# Patient Record
Sex: Male | Born: 1935 | Race: White | Hispanic: No | Smoking: Former smoker
Health system: Southern US, Community
[De-identification: ages and names within clinical notes are randomized; demographics above are authoritative.]

## PROBLEM LIST (undated history)

## (undated) DIAGNOSIS — I219 Acute myocardial infarction, unspecified: Secondary | ICD-10-CM

## (undated) DIAGNOSIS — I739 Peripheral vascular disease, unspecified: Secondary | ICD-10-CM

## (undated) DIAGNOSIS — E119 Type 2 diabetes mellitus without complications: Secondary | ICD-10-CM

## (undated) DIAGNOSIS — N400 Enlarged prostate without lower urinary tract symptoms: Secondary | ICD-10-CM

## (undated) DIAGNOSIS — E7800 Pure hypercholesterolemia, unspecified: Secondary | ICD-10-CM

## (undated) DIAGNOSIS — I10 Essential (primary) hypertension: Secondary | ICD-10-CM

## (undated) DIAGNOSIS — I639 Cerebral infarction, unspecified: Secondary | ICD-10-CM

## (undated) DIAGNOSIS — I6529 Occlusion and stenosis of unspecified carotid artery: Secondary | ICD-10-CM

## (undated) DIAGNOSIS — C801 Malignant (primary) neoplasm, unspecified: Secondary | ICD-10-CM

## (undated) HISTORY — DX: Occlusion and stenosis of unspecified carotid artery: I65.29

## (undated) HISTORY — PX: CARDIAC CATHETERIZATION: SHX172

## (undated) HISTORY — PX: CORONARY ANGIOPLASTY: SHX604

## (undated) HISTORY — DX: Essential (primary) hypertension: I10

## (undated) HISTORY — DX: Benign prostatic hyperplasia without lower urinary tract symptoms: N40.0

## (undated) HISTORY — DX: Type 2 diabetes mellitus without complications: E11.9

---

## 1971-05-26 HISTORY — PX: CHOLECYSTECTOMY: SHX55

## 2002-01-11 ENCOUNTER — Ambulatory Visit (HOSPITAL_COMMUNITY): Admission: RE | Admit: 2002-01-11 | Discharge: 2002-01-11 | Payer: Self-pay | Admitting: Internal Medicine

## 2002-05-10 ENCOUNTER — Ambulatory Visit (HOSPITAL_COMMUNITY): Admission: RE | Admit: 2002-05-10 | Discharge: 2002-05-10 | Payer: Self-pay | Admitting: Internal Medicine

## 2005-02-19 ENCOUNTER — Encounter: Payer: Self-pay | Admitting: Internal Medicine

## 2005-02-19 ENCOUNTER — Ambulatory Visit: Payer: Self-pay | Admitting: Internal Medicine

## 2005-02-19 ENCOUNTER — Ambulatory Visit (HOSPITAL_COMMUNITY): Admission: RE | Admit: 2005-02-19 | Discharge: 2005-02-19 | Payer: Self-pay | Admitting: Internal Medicine

## 2005-05-25 HISTORY — PX: OTHER SURGICAL HISTORY: SHX169

## 2005-06-09 ENCOUNTER — Ambulatory Visit: Payer: Self-pay | Admitting: Internal Medicine

## 2005-06-09 ENCOUNTER — Encounter: Payer: Self-pay | Admitting: Internal Medicine

## 2005-06-09 ENCOUNTER — Ambulatory Visit (HOSPITAL_COMMUNITY): Admission: RE | Admit: 2005-06-09 | Discharge: 2005-06-09 | Payer: Self-pay | Admitting: Internal Medicine

## 2005-06-24 ENCOUNTER — Encounter (INDEPENDENT_AMBULATORY_CARE_PROVIDER_SITE_OTHER): Payer: Self-pay | Admitting: General Surgery

## 2005-06-24 ENCOUNTER — Inpatient Hospital Stay (HOSPITAL_COMMUNITY): Admission: RE | Admit: 2005-06-24 | Discharge: 2005-06-30 | Payer: Self-pay | Admitting: General Surgery

## 2006-07-20 ENCOUNTER — Ambulatory Visit: Payer: Self-pay | Admitting: Internal Medicine

## 2006-07-20 ENCOUNTER — Ambulatory Visit (HOSPITAL_COMMUNITY): Admission: RE | Admit: 2006-07-20 | Discharge: 2006-07-20 | Payer: Self-pay | Admitting: Internal Medicine

## 2006-07-20 HISTORY — PX: COLONOSCOPY: SHX5424

## 2008-02-07 ENCOUNTER — Inpatient Hospital Stay (HOSPITAL_COMMUNITY): Admission: EM | Admit: 2008-02-07 | Discharge: 2008-02-07 | Payer: Self-pay | Admitting: Emergency Medicine

## 2009-05-25 HISTORY — PX: COLONOSCOPY: SHX174

## 2009-09-04 ENCOUNTER — Encounter (INDEPENDENT_AMBULATORY_CARE_PROVIDER_SITE_OTHER): Payer: Self-pay | Admitting: *Deleted

## 2009-10-01 ENCOUNTER — Ambulatory Visit: Payer: Self-pay | Admitting: Internal Medicine

## 2009-10-09 ENCOUNTER — Ambulatory Visit (HOSPITAL_COMMUNITY): Admission: RE | Admit: 2009-10-09 | Discharge: 2009-10-09 | Payer: Self-pay | Admitting: Internal Medicine

## 2009-10-09 ENCOUNTER — Ambulatory Visit: Payer: Self-pay | Admitting: Internal Medicine

## 2009-10-10 DIAGNOSIS — Z86010 Personal history of colonic polyps: Secondary | ICD-10-CM

## 2009-10-14 ENCOUNTER — Encounter: Payer: Self-pay | Admitting: Internal Medicine

## 2010-06-26 NOTE — Letter (Signed)
Summary: Scheduled Appointment  [facility]  [address]   [place], [place] [address]   Phone: [phone]  Fax: [phone]    September 04, 2009   Dear: Jonathan Avery            DOB: 23-Dec-1930    I have been instructed to schedule you an appointment in our office.  Your appointment is as follows:   Date:       Oct 01, 2009   Time:       11 AM     Please be here 15 minutes early.   Provider:    DR [name]    Please contact the office if you need to reschedule this appointment for a more convenient time.   Thank you,    [facility] Ph: [phone]   Fax: [phone]

## 2010-06-26 NOTE — Letter (Signed)
Summary: Patient Notice, Colon Biopsy Results  [facility]  [address]   [place], [place] [address]   Phone: [phone]  Fax: [phone]       Oct 14, 2009   Jonathan Avery [address] [place],   [address] July 15, 1930    Dear Mr. Gade,  I am pleased to inform you that the biopsies taken during your recent colonoscopy did not show any evidence of cancer upon pathologic examination.  Additional information/recommendations:  No further action is needed at this time.  Please follow-up with your primary care physician for your other healthcare needs.  You should have one more colonoscopy examination  in 5 years if your overall health permits.  Please call us if you are having persistent problems or have questions about your condition that have not been fully answered at this time.  Sincerely,    R. Garfield Cornea MD, [facility] Ph: [phone]    Fax: [phone]   Appended Document: Patient Notice, Colon Biopsy Results mailed letter to pt  Appended Document: Patient Notice, Colon Biopsy Results reminder in computer

## 2010-06-26 NOTE — Assessment & Plan Note (Signed)
Summary: consult for tcs,hx of colon neoplasm/ss   Visit Type:  Follow-up Visit Primary Care Provider:  fagan  Chief Complaint:  schedule tcs.  History of Present Illness: 77 year old gentleman returns to set up a followup colonoscopy given his history of a large villous adenoma requiring a right hemicolectomy back in 2007. He had a negative followup colonoscopy in 2008. He has done well from a GI standpoint since that time. Hasn't had a change in bowel habits melena or rectal bleeding. In fact, he denies any significant intercurrent medical problems since being seen lastly here in 2008.  Preventive Screening-Counseling & Management  Alcohol-Tobacco     Smoking Status: never  Current Medications (verified): 1)  Lopressor .Marland Kitchen.. 168m By Mouth Two Times A Day 2)  Norvasc ..Marland Kitchen. 147mBy Mouth Daily 3)  Glucophage ...Marland Kitchen 10006mwo Times A Day 4)  Flomax .... 0.4mg39m Mouth Daily 5)  Lotensin .... Marland Kitchen0mg58mMouth Daily 6)  Vytorin .... One Tablet By Mouth Daily 7)  Avandaryl .... One Tablet By Mouth Daily 8)  Tricor .... 1Marland KitchenMarland KitchenMarland Kitcheng 15mouth Daily 9)  Hctz .... 12Marland Kitchen5mg Da35m 10)  Asa .... 25mMarland Kitchen 1140mish Oil 12)  Hydralazine Hcl 10 Mg Tab (Hydralazine Hcl) .... Take 1 Tablet By Mouth Twice A Day 13)  Cinnamon .... Once Daily 14)  Niacin .... Once Daily 15)  B12 .... Once Daily  Allergies (verified): No Known Drug Allergies  Past History:  Past Medical History: Last updated: 09/30/2009 Coronary Artery Disease Diabetes Hypertension Villous Adenoma, colon Benign prostatic hypertrophy  Past Surgical History: Last updated: 09/30/2009 Cholecystectomy Partial Colectomy   Family History: Last updated: 05/10/20May 30, 2011 deceased Mother: alive- no problem age 47 Sibli61s: 4 brothers No FH of Colon Cancer:  Social History: Last updated: 05/10/202011-05-30 Status: Married Children: 3 Occupation: retired Patient has never smoked.  Alcohol Use - no  Risk Factors: Smoking Status:  never (10/02/2003/30/2011y History: Father: deceased Mother: alive- no problem age 47 Sibli77s: 4 brothers No FH of Colon Cancer:  Social History: Marital Status: Married Children: 3 Occupation: retired Patient has never smoked.  Alcohol Use - no Smoking Status:  never  Vital Signs:  Patient profile:   78 year 79d male Height:      70.5 inches Weight:      242 pounds BMI:     34.36 Temp:     97.6 degrees F oral Pulse rate:   60 / minute BP sitting:   128 / 78  (left arm) Cuff size:   regular  Vitals Entered By: Julie LaBurnadette Petery 10, 2011 11:2876M81:15ysical Exam  General:  veryt pleasant 78 -year21ld gentleman resting comfortably Eyes:  no scleral icteric conjunctiva are pink Lungs:  clear to auscultation Heart:  regular rate and rhythm without murmur gallop or Abdomen:  nondistended positive bowel sounds soft nontender without appreciable mass or organomegaly Rectal:  deferred until time of colonoscopy  Impression & Recommendations: Impression: 78 year 22d gentleman with a history of a large villous adenoma removed via right hemicolectomy back in 2007. He had negative followup colonoscopy in 2008. He presents now for repeat examination. I discussed the risks, benefits, limitations, alternatives and imponderables with this nice gentleman today. His questions have been answered. He is agreeable. We'll make further recommendations once colonoscopy has been carried out.  Appended Document: Orders Update    Clinical Lists Changes  Problems: Added new problem of COLONIC POLYPS, HX OF (ICD-V12.72) Orders: Added new Service order of  Est. Patient Level III (46431) - Signed

## 2010-06-26 NOTE — Letter (Signed)
Summary: TCS ORDER  TCS ORDER   Imported By: Sofie Rower 10/01/2009 11:40:24  _____________________________________________________________________  External Attachment:    Type:   Image     Comment:   External Document

## 2010-10-10 NOTE — Op Note (Signed)
NAME:  Jonathan Avery, Jonathan Avery                         ACCOUNT NO.:  [id number]   MEDICAL RECORD NO.:  [id number]                   PATIENT TYPE:  AMB   LOCATION:  DAY                                  FACILITY:  [facility]   PHYSICIAN:  R. Garfield Cornea, M.D.              DATE OF BIRTH:  1931-01-22   DATE OF PROCEDURE:  05/10/2002  DATE OF DISCHARGE:                                 OPERATIVE REPORT   PROCEDURE:  Colonoscopy with snare polypectomy.   INDICATION FOR PROCEDURE:  The patient is a 77 year old gentleman who  underwent colonoscopy back in August of this year.  He was found to have a  large polyp at the ileocecal valve, which was removed in piecemeal fashion.  I felt that some residual polyp tissue may have been left behind.  He comes  now for colonoscopy for surveillance to make sure that all the polypoid  tissue has been removed.  This approach has been discussed with the patient  previously and again at the bedside.  The potential risks, benefits, and  alternatives have been reviewed, questions answered.  ASA-2.   DESCRIPTION OF PROCEDURE:  O2 saturation, blood pressure, pulse, and  respirations were monitored throughout the entire procedure.   Conscious sedation:  Versed 2 mg IV, Demerol 50 mg IV.   Instrument:  Olympus video chip adult colonoscope.   Findings:  Digital rectal exam revealed no abnormalities.   Endoscopic findings:  Prep was good.   Rectum:  Examination of rectal mucosa, including retroflexed view of the  anal verge, revealed no abnormalities.   Colon:  Colonic mucosa was surveyed from the rectosigmoid junction through  the left, transverse, and right colon to the appendiceal orifice, ileocecal  valve, and cecum.  These structures were well-seen and photographed for the  record.  The patient had scattered small-mouthed diverticula all the way  into the right colon.  At the ileocecal valve there was a nearly 1 cm  sessile polypoid area, most likely  representing the base of the prior larger  polyp, which was removed in piecemeal fashion.  There was also a 5 mm  adjacent polyp on a fold, please see photos.  The polyp was lifted away from  the colonic mucosa with 4 cc of sterile saline injected submucosally.  Subsequently using the snare attached to the ERBE unit, the residual  polypoid tissue was removed in piecemeal fashion, recovered through the  scope.  The smaller adjacent polyp was also removed.  There was some oozing  at the polyp base at the ileocecal valve, for which 4 cc of 1:10,000  epinephrine was injected submucosally and the bleeder was touched up with  the tip of the snare.  It was watched for 10 minutes, and there was no more  bleeding.  From this level the scope was slowly withdrawn.  In the right  colon there were two 5 mm polyps on folds  which were cold biopsied/removed.  All previously-mentioned mucosal surfaces were further inspected, and no  other abnormalities were observed.  The patient tolerated the procedure well  and was reactive in endoscopy.   IMPRESSION:  1. Normal rectum.  2. Pancolonic diverticula.  3. Polyp at the ileocecal valve removed totally today with snare polypectomy     in piecemeal fashion.  A second smaller polyp in the cecum removed in     similar fashion.  Oozing treated with injection of epinephrine and dermal     sealing.  4. Remaining polyps in the right colon cold biopsied/removed.  The remainder     of the colonic mucosa appeared normal.   The ileocecal valve polyp's histology previously demonstrated inflammatory  and adenomatous change.   RECOMMENDATIONS:  1. No aspirin or Advil, etc., for the next 10 days.  2. Follow up on pathology.  3. Further recommendations to follow.                                               Bridgette Habermann, M.D.    RMR/MEDQ  D:  05/10/2002  T:  05/10/2002  Job:  [id number]   cc:   Paula Compton. Willey Blade, M.D.  [address]  [place]  [place]  [address]  Fax: [phone]

## 2010-10-10 NOTE — H&P (Signed)
NAME:  Jonathan Avery, Jonathan Avery NO.:  [id number]   MEDICAL RECORD NO.:  [id number]          PATIENT TYPE:  AMB   LOCATION:                                FACILITY:  [facility]   PHYSICIAN:  Jamesetta So, M.D.  DATE OF BIRTH:  [birth year]   DATE OF ADMISSION:  DATE OF DISCHARGE:  LH                                HISTORY & PHYSICAL   CHIEF COMPLAINT:  Colon mass.   HISTORY OF PRESENT ILLNESS:  The patient is a 77 year old white male who was  referred for evaluation and treatment of a cecal mass.  He has had two  colonoscopies by Dr. [name] who was unable to completely excise the polyp at  the ileocecal valve.  A villous adenoma has been found, and no evidence of  malignancy has been found on both biopsies.  No hematochezia, constipation,  diarrhea, or family history of colon carcinoma is noted.   PAST MEDICAL HISTORY:  1.  Hypertension.  2.  Coronary artery disease.  3.  Non-insulin-dependent diabetes mellitus.   PAST SURGICAL HISTORY:  Open cholecystectomy in 1972.   CURRENT MEDICATIONS:  1.  Lopressor 100 mg p.o. b.i.d.  2.  Norvasc 10 mg p.o. daily.  3.  Glucophage 100 mg p.o. daily.  4.  Flomax 0.4 mg p.o. daily.  5.  Lotensin 40 mg p.o. daily.  6.  Vytorin one tablet p.o. daily.  7.  Avandaryl one tablet p.o. daily.  8.  Tricor 145 mg p.o. daily.  9.  Hydrochlorothiazide 12.5 mg p.o. daily.  10. Baby aspirin which he is holding.   ALLERGIES:  NO KNOWN DRUG ALLERGIES.   REVIEW OF SYSTEMS:  The patient denies drinking or smoking.  He had an MI  approximately 10 years ago.  He denies any recent shortness of breath, chest  pain, leg swelling, CVA, or bleeding disorders.   PHYSICAL EXAMINATION:  GENERAL:  The patient is a well-developed, well-  nourished white male in no acute distress.  HEENT:  No scleral icterus.  LUNGS:  Clear to auscultation with equal breath sounds bilaterally.  HEART:  Regular rate and rhythm without S3, S4, or murmurs.  ABDOMEN:  Soft,  nontender, nondistended.  No hepatosplenomegaly, masses, or  hernias are identified.   IMPRESSION:  Colon neoplasm, unspecified.   PLAN:  The patient is scheduled for a right hemicolectomy on June 24, 2005.  The risks and benefits of the procedure, including bleeding,  infection, cardiopulmonary difficulties, and the possibility of a blood  transfusion were fully explained to the patient who gave informed consent.      Jamesetta So, M.D.  Electronically Signed     MAJ/MEDQ  D:  06/18/2005  T:  06/18/2005  Job:  [id number]   cc:   Paula Compton. Willey Blade, MD  Fax: [phone]   R. Garfield Cornea, M.D.  [address]  [place]  [place] [address]

## 2010-10-10 NOTE — Op Note (Signed)
NAME:  Jonathan Avery, Jonathan Avery                 ACCOUNT NO.:  [id number]   MEDICAL RECORD NO.:  [id number]          PATIENT TYPE:  AMB   LOCATION:  DAY                           FACILITY:  [facility]   PHYSICIAN:  R. Garfield Cornea, M.D. DATE OF BIRTH:  [birth year]   DATE OF PROCEDURE:  07/20/2006  DATE OF DISCHARGE:                               OPERATIVE REPORT   PROCEDURE:  Surveillance colonoscopy.   INDICATIONS FOR PROCEDURE:  The patient is a 77 year old gentleman who  underwent a laparotomy and a right hemicolectomy for a villous adenoma 1  year ago.  He is well.  Has no lower GI tract symptoms.  He is here for  a 1-year surveillance.  This approach has discussed with the patient at  length.  Potential risks and benefits and been reviewed.  Please see the  documentation in Dr. the medical record.   PROCEDURE NOTE:  O2 saturation, blood pressure, pulse and respirations  were monitored throughout the entire procedure.  Conscious sedation:  Demerol 75 mg IV and Versed 4 mg IV in divided doses.  Instrument:  Pentax video chip system.   FINDINGS:  Digital rectal exam revealed no abnormalities.  Endoscopic  findings:  The prep was excellent.  Examination of the colonic mucosa  was undertaken from the rectosigmoid junction.  The scope was advanced  through the left and transverse segments to the area of anastomosis with  small bowel.  This anastomosis was well-seen.  The anastomosis was  patent and the distal small bowel appeared normal.  From this level the  scope was slowly withdrawn and all previously-mentioned mucosal surfaces  were again seen.  The scope was pulled down in the rectum,where a  thorough examination of the rectal mucosa and a retroflexed view of the  anal verge was undertaken.  The residual colon and the rectal mucosa  appeared normal.  The patient tolerated the procedure well and was  reacted in endoscopy.   IMPRESSION:  Status post right hemicolectomy.  Residual colonic mucosa  appeared normal, normal rectum.   RECOMMENDATIONS:  Repeat colonoscopy 3 years.      Bridgette Habermann, M.D.  Electronically Signed     RMR/MEDQ  D:  07/20/2006  T:  07/20/2006  Job:  [id number]   cc:   Paula Compton. Willey Blade, MD  Fax: [phone]

## 2010-10-10 NOTE — Op Note (Signed)
NAME:  Jonathan Avery, Jonathan Avery NO.:  [id number]   MEDICAL RECORD NO.:  [id number]          PATIENT TYPE:  INP   LOCATION:  A304                          FACILITY:  [facility]   PHYSICIAN:  Jamesetta So, M.D.  DATE OF BIRTH:  1930/06/18   DATE OF PROCEDURE:  06/24/2005  DATE OF DISCHARGE:                                 OPERATIVE REPORT   PREOPERATIVE DIAGNOSIS:  Colon neoplasm.   POSTOPERATIVE DIAGNOSIS:  Colon neoplasm.   PROCEDURE:  Right hemicolectomy (partial colectomy with resection of  terminal ileum).   SURGEON:  Dr. Aviva Signs.   ASSISTANT:  Dr. Newt Minion.   ANESTHESIA:  General endotracheal.   INDICATIONS:  The patient is a 77 year old white male who was found on  colonoscopy by Dr. [name] to have a mass in the cecum at the ileocecal valve  that could not be fully resected. A villous adenoma was found. The patient  now comes to the operating for a right hemicolectomy. Risks and benefits of  the procedure including bleeding, infection, cardiopulmonary difficulties  and the possibility of a blood transfusion were fully explained to the  patient, who gave informed consent.   PROCEDURE NOTE:  The patient was placed in supine position. After induction  of general endotracheal anesthesia, the abdomen was prepped and draped using  the usual sterile technique with Betadine. Surgical site confirmation was  performed.   A midline incision was made from above the umbilicus to below the umbilicus.  The peritoneal cavity was entered into without difficulty. There was some  adhesions from the previous open cholecystectomy in the right upper  quadrant, and these were freed away sharply and using the harmonic scalpel  without difficulty. The liver was palpated and noted to be within normal  limits. The right colon was mobilized along the peritoneal reflection. A GIA  stapler was placed across the terminal ileum and fired. This was proximal  transverse colon. The  mesentery of the right colon was then divided using  the harmonic scalpel silk ties. The specimen was opened in the operating  room once it was removed from the operative field. The colonic neoplasm  within the specimen was removed. This was then sent to pathology for further  examination. A side-to-side ileocolic anastomosis was then performed using a  GIA 70 stapler. The enterotomy was closed using a TA-60 stapler. The staple  line was bolstered using 3-0 silk Lambert sutures. The mesenteric defect was  closed using a 2-0 chromic gut running suture and normal saline. The bowel  was returned into the abdominal cavity an in orderly fashion. All surgical  personnel then changed their gloves.   The fascia was reapproximated using a looped 0 Novofil running suture.  Subcutaneous layer was irrigated with normal saline, and the skin was closed  using staples. Betadine ointment and dry sterile dressings were applied.   All tape and needle counts were correct at the end of the procedure. The  patient was extubated in the operating room and went back to recovery room  awake in stable condition.   COMPLICATIONS:  None.   SPECIMEN:  Right colon.   BLOOD LOSS:  Minimal.      Jamesetta So, M.D.  Electronically Signed     MAJ/MEDQ  D:  06/24/2005  T:  06/24/2005  Job:  [id number]

## 2010-10-10 NOTE — Discharge Summary (Signed)
NAME:  Jonathan Avery, Jonathan Avery NO.:  [id number]   MEDICAL RECORD NO.:  [id number]          PATIENT TYPE:  INP   LOCATION:  A304                          FACILITY:  [facility]   PHYSICIAN:  Jamesetta So, M.D.  DATE OF BIRTH:  1930/08/12   DATE OF ADMISSION:  06/24/2005  DATE OF DISCHARGE:  02/06/2007LH                                 [place] COURSE SUMMARY:  the patient is a 77 year old white male who was  found on multiple endoscopies to have a villous adenoma at the ileocecal  valve. This could not be removed endoscopically, and the patient was  referred for a right hemicolectomy. This was performed on June 24, 2005.  The patient tolerated procedure well. His postoperative course was  remarkable only for a mild ileus which quickly resolved. His diet was then  advanced without difficulty. Final pathology revealed residual villous  adenoma without evidence of carcinoma. The patient is being discharged home  on June 30, 2005 in good and improving condition.   DISCHARGE INSTRUCTIONS:  The patient is to follow up with Dr. Aviva Signs  on July 02, 2005. Discharge medications include Darvocet-N 100 one to two  tablets p.o. q.4h. p.r.n. pain. He is to resume all his other medications as  previously prescribed.   PRINCIPAL DIAGNOSES:  1.  Villous adenoma, colon.  2.  Hypertension.  3.  Coronary artery disease.  4.  Non-insulin-dependent diabetes mellitus.  5.  Benign prostatic hypertrophy.   PRINCIPAL PROCEDURE:  Partial colectomy with resection of terminal ileum on  June 24, 2005.      Jamesetta So, M.D.  Electronically Signed     MAJ/MEDQ  D:  06/30/2005  T:  06/30/2005  Job:  [id number]   cc:   Paula Compton. Willey Blade, MD  Fax: [phone]   R. Garfield Cornea, M.D.  [address]    [place] [address]

## 2010-10-10 NOTE — Op Note (Signed)
NAME:  Jonathan Avery, Jonathan Avery                         ACCOUNT NO.:  [id number]   MEDICAL RECORD NO.:  [id number]                   PATIENT TYPE:  AMB   LOCATION:  DAY                                  FACILITY:  [facility]   PHYSICIAN:  Cristopher Estimable. [name], M.D.               DATE OF BIRTH:  1930-07-16   DATE OF PROCEDURE:  01/11/2002  DATE OF DISCHARGE:                                 OPERATIVE REPORT   PROCEDURE:  Colonoscopy with snare polypectomy.   INDICATIONS:  The patient is a 77 year old gentleman referred for colorectal  cancer screening.  The patient is devoid of any lower GI tract symptoms, no  family history of colorectal neoplasia, and a sigmoidoscopy to 50 cm in 1997  did reveal some diverticular changes but no other abnormalities were seen  (Dr. Willey Blade).  He is here for full colonoscopy for screening purposes.  The  approach has been discussed with the patient extensively, and the potential  risks, benefits, and alternatives have been reviewed, questions have been  answered, and he is agreeable.  He is at low risk for conscious sedation.  Please see my handwritten H&P for more information.   DESCRIPTION OF PROCEDURE:  O2 saturation, blood pressure, pulse, and  respiration were monitored throughout the entire procedure.   Conscious sedation:  Versed 6 mg IV, Demerol 75 mg IV in divided doses.   Instrument:  Olympus video chip adult colonoscope.   Findings:  Digital rectal exam revealed no abnormalities.  Endoscopic  findings:  The prep was adequate.   Rectum:  Examination of the rectal mucosa including the retroflex view of  the anal verge revealed no abnormalities.   Colon:  The colonic mucosa was surveyed from the rectosigmoid junction  through the left, transverse, right colon, to the area of the appendiceal  orifice, ileocecal valve.  The ileocecal valve was abnormal.  There was a  pedunculated polypoid mass growing out of the end of the ileocecal valve.  It was growing  across both the distal and proximal sides of the ileocecal  valve.  Please see photos.  The remainder of the colonic mucosa appeared  normal.  This lesion was engaged with the snare.  Most of it was removed  with snare polypectomy in piecemeal fashion.  It was very difficult to get  behind the upstream side of the ileocecal valve.  Certainly a majority of  this lesion was removed.  It took quite awhile to get a good approach and to  get the pieces suctioned through the scope.  Again the vast majority of the  lesion was removed.  I felt it not to be very safe to attempt further  excavation and removal of this lesion today.  Please see before and after  photos.  From this level the scope was slowly withdrawn.  All previously-  viewed mucosal surfaces were again seen.  No other abnormalities were  observed.  The patient tolerated the rather lengthy procedure very well and  was reactivated in endoscopy.   IMPRESSION:  1. Normal rectum.  2. Polypoid lesion growing out of the ileocecal valve, removed in piecemeal     fashion.  A small amount of this lesion may remain behind the ileocecal     valve as described above.    RECOMMENDATIONS:  1. No aspirin or arthritis medications for 10 days.  2. Follow up on pathology.   This lesion appears to be at least a precancerous adenomatous polyp.  It may  well contain Isaah carcinoma.  If this were the case, I told the patient he  may need a resection.  If it does not contain Duilio carcinoma, he will need  a re-look at this area for removal of any residual tissue in the next one to  two months.  Further recommendations to follow.                                                Cristopher Estimable. [name], M.D.    RMR/MEDQ  D:  01/11/2002  T:  01/12/2002  Job:  [id number]   cc:   Paula Compton. Willey Blade, M.D.

## 2010-10-10 NOTE — Op Note (Signed)
NAME:  Jonathan Avery, Jonathan Avery NO.:  [id number]   MEDICAL RECORD NO.:  [id number]          PATIENT TYPE:  AMB   LOCATION:  DAY                           FACILITY:  [facility]   PHYSICIAN:  R. Garfield Cornea, M.D. DATE OF BIRTH:  01-Dec-1930   DATE OF PROCEDURE:  02/19/2005  DATE OF DISCHARGE:                                 OPERATIVE REPORT   PROCEDURE:  Colonoscopy with polypectomy/saline-assisted polypectomy.   INDICATIONS FOR PROCEDURE:  The patient is a 77 year old gentleman with a  history of colonic adenomatous polyps. He had an ileocecal valve polyp back  in 2003. He is devoid of any lower GI tract symptoms. He is here for  surveillance. This approach has been discussed with the patient at length.  Potential risks, benefits, and alternatives have been reviewed and questions  answered. He is agreeable. Please see documentation in the medical record.   PROCEDURE NOTE:  O2 saturation, blood pressure, pulse, and respirations were  monitored throughout the entire procedure. Conscious sedation with Versed 5  mg IV and Demerol 50 mg IV in divided doses.   INSTRUMENT:  Olympus video chip system.   FINDINGS:  Digital rectal exam revealed no abnormalities.   ENDOSCOPIC FINDINGS:  Prep was good.   Rectum:  Examination of the rectal mucosa including retroflexed view of the  anal verge revealed no abnormalities.   Colon:  Colonic mucosa was surveyed from the rectosigmoid junction through  the left, transverse, and right colon to the area of the appendiceal  orifice, ileocecal valve, and cecum. There was a cauliflower sessile polyp  approximately 1.5 to 2 cm growing out the ileocecal valve, going behind the  ileocecal valve, and there were three 5-mm adjacent pedunculated polyps.  Please see photos. From this level, the scope was slowly withdrawn, and all  previously mentioned mucosal surfaces were again seen. Aside from having  pancolonic diverticula, there were no other  abnormalities. Attention was  turned to the polyps at the end and at the ileocecal valve. The two smaller  polyps were removed with hot snare technique. The polypoid lesion at  ileocecal valve was shaved off in a piecemeal fashion with snare cautery.  Multiple fragments were recovered through the scope. It was difficult to get  on the back side of the polyp (toward the base of the cecum). I was able to  get somewhat behind it with the needle and inject 2 cc of normal saline.  This did lift out the polyp which gave me a better access. Was able to shave  the rest of it down fairly well. Multiple fragments again submitted to the  pathologist. There was good hemostasis. The patient tolerated the procedure  well and was reactive to endoscopy.   IMPRESSION:  1.  Normal rectum and pancolonic diverticula.  2.  Multiple polyps in and around the ileocecal valve status post saline-      assisted snare polypectomy. Multiple polypectomy performed.   RECOMMENDATIONS:  1.  No aspirin or arthritis medications for the next 10 days.  2.  Follow up on pathology.  3.  Further recommendations  to follow.      Bridgette Habermann, M.D.  Electronically Signed     RMR/MEDQ  D:  02/19/2005  T:  02/19/2005  Job:  [id number]   cc:   Paula Compton. Willey Blade, MD  Fax: [phone]

## 2010-10-10 NOTE — Op Note (Signed)
NAME:  Jonathan Avery, Jonathan Avery                 ACCOUNT NO.:  [id number]   MEDICAL RECORD NO.:  [id number]          PATIENT TYPE:  AMB   LOCATION:  DAY                           FACILITY:  [facility]   PHYSICIAN:  R. Garfield Cornea, M.D. DATE OF BIRTH:  04/18/1931   DATE OF PROCEDURE:  06/09/2005  DATE OF DISCHARGE:                                 OPERATIVE REPORT   PROCEDURE:  Colonoscopy with snare polypectomy and biopsy.   INDICATIONS FOR PROCEDURE:  The patient is a 77 year old gentleman with a  history of villous adenoma, removed a polyp from ileocecal valve back in  September. It was sprawling and went behind the ileocecal valve and down  towards the base of the cecum. There was concern that it may not have all  been removed. He is here for followup exam. This approach has been discussed  with the patient previously and again at the bedside. He is not having any  GI symptoms. Potential risks, benefits, and alternatives have been reviewed  and questions answered. He is agreeable. Please see documentation in the  medical record.   PROCEDURE NOTE:  O2 saturation, blood pressure, pulse, and respirations were  monitored throughout the entire procedure. Conscious sedation with Versed 4  mg IV and Demerol 100 mg IV in divided doses.   INSTRUMENT:  Olympus video chip system.   FINDINGS:  Digital rectal exam revealed no abnormalities.   ENDOSCOPIC FINDINGS:  Prep was unfortunately not very good, particularly on  the right side where we have special interest.   Rectum:  Examination of the rectal mucosa including retroflexed view of the  anal verge revealed no abnormalities.   Colon:  Colonic mucosa was surveyed from the rectosigmoid junction through  the left, transverse, and right colon to the area of the appendiceal  orifice, ileocecal valve, and cecum. The landmarks were difficult to see  because there was a quite a bit of formed and semi-formed stool. (The  patient states he took all of his prep  as recommended). There did appear to  be persistence of polypoid tissue at the ileocecal valve going over the  distal side up to the proximal side down into the cecum, the total extent of  which could not be seen. It was felt the colon had been ___________ because  the patient did take his prep. Utilizing the snare cautery unit, the polyp  was shaved additionally on the distal side of the ileocecal valve. There was  some persisting polypoid tissue protruding from the proximal side of the  valve. I was unable to get at it in part because of the poor prep and  location. However, cold biopsy forceps were utilized to push the ileocecal  valve out of the way and biopsy some of this polypoid material on the  proximal side of the valve. From this level, the scope was slowly withdrawn,  and all previously mentioned mucosal surfaces were again seen. The patient  had scattered pan colonic diverticula. There was no other persisting  polypoid lesions or mass seen. However, the prep was poor which definitely  compromised  the exam today. The patient tolerated the procedure well and was  reactive to endoscopy.   IMPRESSION:  Normal colon and pan colonic diverticula. Marginal to poor prep  made the exam more difficult. Persistence of polypoid lesion ileocecal valve  (as previously noted to be a villous adenoma) was shaved additionally with  snare cautery, and biopsy of the proximal aspect was taken, but I do not  feel all of this lesion was removed.   RECOMMENDATIONS:  1.  Will follow up on pathology.  2.  Further recommendations to follow.  3.  No aspirin or arthritis medications for the next 10 days.      Bridgette Habermann, M.D.  Electronically Signed     RMR/MEDQ  D:  06/09/2005  T:  06/09/2005  Job:  [id number]   cc:   Paula Compton. Willey Blade, MD  Fax: [phone]

## 2010-10-10 NOTE — H&P (Signed)
NAME:  Jonathan Avery, Jonathan Avery NO.:  [id number]   MEDICAL RECORD NO.:  [id number]          PATIENT TYPE:  INP   LOCATION:  A316                          FACILITY:  [facility]   PHYSICIAN:  Paula Compton. Willey Blade, MD       DATE OF BIRTH:  [birth year]   DATE OF ADMISSION:  02/07/2008  DATE OF DISCHARGE:  09/15/2009LH                              HISTORY & PHYSICAL   CHIEF COMPLAINT:  Right chest pain.   HISTORY OF PRESENT ILLNESS:  This patient is a 77 year old white male  with a history of coronary artery disease who presented to the emergency  room with a 3-day history of right-sided chest pain with intermittent  episodes of shortness of breath over the past year.  He denied  diaphoresis, nausea, vomiting, or syncope.  Pain had started in his back  and then was felt around his right chest.  He was evaluated in the  emergency room.  The emergency room physician called his cardiologist  who recommended that he be admitted and ruled out for an MI.  He denied  substernal or left-sided chest pain.  He had not recently used  nitroglycerin.  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  He had an  angioplasty to his RCA at that time, has multiple risk factors including  diabetes, hyperlipidemia, and hypertension.  He does not smoke.   PAST HISTORY:  1. Coronary artery disease.  2. Cholecystectomy.  3. Diabetes.  4. Hyperlipidemia.  5. Hypertension.  6. Elevated PSA at 7.03.  7. Diabetes, A1c was 6.4 last month.   MEDICATIONS:  1. Glipizide XL 10 mg daily.  2. Metformin 1000 mg b.i.d.  3. Actos 45 mg daily.  4. Zocor 40 mg daily.  5. Gemfibrozil 600 mg b.i.d.  6. Niacin 1000 mg daily.  7. Fish oil daily.  8. Benicar HCT 40/25 mg daily.  9. Lopressor 100 mg b.i.d.  10.Norvasc 5 mg daily.  11.Aspirin 81 mg daily.   ALLERGIES:  None.   SOCIAL HISTORY:  He does not smoke or drink.   FAMILY HISTORY:  Four brothers and his mother had hypertension.  His  father had a bleeding  ulcer.   REVIEW OF SYSTEMS:  Noncontributory.   PHYSICAL EXAMINATION:  GENERAL:  Alert and in no distress.  HEENT:  No scleral icterus.  Pharynx is unremarkable.  NECK:  Supple with no JVD or thyromegaly.  LUNGS:  Clear.  HEART:  Regular with no murmurs at 64 beats per minute.  ABDOMEN:  Nontender.  No hepatosplenomegaly.  EXTREMITIES:  Normal pulses.  No cyanosis, clubbing, or edema.  NEURO:  No focal weakness.  LYMPH NODES:  No cervical or supraclavicular enlargement.  SKIN:  There is a rash on his right chest radiating around to his  abdomen consistent with shingles.   LABORATORY DATA:  Chest x-ray reveals no infiltrate.  His D-dimer was  mildly elevated.  He does underwent a CT of the chest which revealed no  pulmonary embolus.  He did have noncalcified right lung nodules  measuring 5-6.5 mm.  A  followup CT was recommended in 6 months.  He also  had a 12-mm left thyroid nodule and thyroid ultrasound was recommended.  His EKG revealed normal sinus rhythm with an old inferior infarct and  also reveals sinus bradycardia at 51 beats per minute with an old  inferior infarct.  Cardiac markers were negative.   IMPRESSION:  1. Shingles resulting in chest pain.  The patient had been admitted to      observation to rule out a myocardial infarction over the telephone.      When I examined him, his shingles rash was discovered.  He will be      treated with acyclovir 800 mg 5 times a day for 7 days and Darvocet      q.4 h. p.r.n.  He will be allowed to go home on the evening of      admission.  He will have followup of his pulmonary nodules as      recommended and followup of his thyroid nodule as recommended.  He      was reassured there appears to be no sign of cardiac related pain.  2. Diabetes.  Continue current therapy.  3. Hyperlipidemia.  4. Hypertension.  5. History of elevated prostate-specific antigen.      Paula Compton. Willey Blade, MD  Electronically Signed     ROF/MEDQ  D:   02/08/2008  T:  02/08/2008  Job:  [id number]

## 2011-02-23 LAB — HEPATIC FUNCTION PANEL
ALT: 14
AST: 22
Total Protein: 6.6

## 2011-02-23 LAB — POCT CARDIAC MARKERS
CKMB, poc: 1.2
Troponin i, poc: <0.05

## 2011-02-23 LAB — CBC
HCT: 35.4 — ABNORMAL LOW
Hemoglobin: 12.2 — ABNORMAL LOW
MCHC: 34.5
RDW: 13.6

## 2011-02-23 LAB — BASIC METABOLIC PANEL
CO2: 22
Chloride: 103
Glucose, Bld: 118 — ABNORMAL HIGH
Potassium: 4
Sodium: 135

## 2011-02-23 LAB — AMYLASE: Amylase: 52

## 2011-02-23 LAB — DIFFERENTIAL
Basophils Absolute: 0
Eosinophils Relative: 4
Lymphocytes Relative: 19
Monocytes Absolute: 0.4

## 2011-02-23 LAB — D-DIMER, QUANTITATIVE: D-Dimer, Quant: 0.61 — ABNORMAL HIGH

## 2011-07-24 DIAGNOSIS — E119 Type 2 diabetes mellitus without complications: Secondary | ICD-10-CM | POA: Diagnosis not present

## 2011-07-29 DIAGNOSIS — E119 Type 2 diabetes mellitus without complications: Secondary | ICD-10-CM | POA: Diagnosis not present

## 2011-07-29 DIAGNOSIS — I10 Essential (primary) hypertension: Secondary | ICD-10-CM | POA: Diagnosis not present

## 2011-09-08 DIAGNOSIS — H2510 Age-related nuclear cataract, unspecified eye: Secondary | ICD-10-CM | POA: Diagnosis not present

## 2011-09-21 DIAGNOSIS — H2510 Age-related nuclear cataract, unspecified eye: Secondary | ICD-10-CM | POA: Diagnosis not present

## 2011-09-21 DIAGNOSIS — IMO0002 Reserved for concepts with insufficient information to code with codable children: Secondary | ICD-10-CM | POA: Diagnosis not present

## 2011-10-02 DIAGNOSIS — H2510 Age-related nuclear cataract, unspecified eye: Secondary | ICD-10-CM | POA: Diagnosis not present

## 2011-10-05 DIAGNOSIS — H2510 Age-related nuclear cataract, unspecified eye: Secondary | ICD-10-CM | POA: Diagnosis not present

## 2011-10-05 DIAGNOSIS — IMO0002 Reserved for concepts with insufficient information to code with codable children: Secondary | ICD-10-CM | POA: Diagnosis not present

## 2011-12-23 DIAGNOSIS — E119 Type 2 diabetes mellitus without complications: Secondary | ICD-10-CM | POA: Diagnosis not present

## 2011-12-31 DIAGNOSIS — E119 Type 2 diabetes mellitus without complications: Secondary | ICD-10-CM | POA: Diagnosis not present

## 2011-12-31 DIAGNOSIS — I10 Essential (primary) hypertension: Secondary | ICD-10-CM | POA: Diagnosis not present

## 2012-04-12 DIAGNOSIS — Z79899 Other long term (current) drug therapy: Secondary | ICD-10-CM | POA: Diagnosis not present

## 2012-04-12 DIAGNOSIS — I10 Essential (primary) hypertension: Secondary | ICD-10-CM | POA: Diagnosis not present

## 2012-04-12 DIAGNOSIS — E785 Hyperlipidemia, unspecified: Secondary | ICD-10-CM | POA: Diagnosis not present

## 2012-04-12 DIAGNOSIS — E119 Type 2 diabetes mellitus without complications: Secondary | ICD-10-CM | POA: Diagnosis not present

## 2012-04-15 DIAGNOSIS — Z23 Encounter for immunization: Secondary | ICD-10-CM | POA: Diagnosis not present

## 2012-04-15 DIAGNOSIS — E119 Type 2 diabetes mellitus without complications: Secondary | ICD-10-CM | POA: Diagnosis not present

## 2012-04-15 DIAGNOSIS — E785 Hyperlipidemia, unspecified: Secondary | ICD-10-CM | POA: Diagnosis not present

## 2012-04-15 DIAGNOSIS — I10 Essential (primary) hypertension: Secondary | ICD-10-CM | POA: Diagnosis not present

## 2012-07-11 DIAGNOSIS — E119 Type 2 diabetes mellitus without complications: Secondary | ICD-10-CM | POA: Diagnosis not present

## 2012-07-18 DIAGNOSIS — E119 Type 2 diabetes mellitus without complications: Secondary | ICD-10-CM | POA: Diagnosis not present

## 2012-07-18 DIAGNOSIS — I10 Essential (primary) hypertension: Secondary | ICD-10-CM | POA: Diagnosis not present

## 2012-10-18 DIAGNOSIS — E119 Type 2 diabetes mellitus without complications: Secondary | ICD-10-CM | POA: Diagnosis not present

## 2012-11-01 DIAGNOSIS — I10 Essential (primary) hypertension: Secondary | ICD-10-CM | POA: Diagnosis not present

## 2012-11-01 DIAGNOSIS — E119 Type 2 diabetes mellitus without complications: Secondary | ICD-10-CM | POA: Diagnosis not present

## 2013-04-18 DIAGNOSIS — E119 Type 2 diabetes mellitus without complications: Secondary | ICD-10-CM | POA: Diagnosis not present

## 2013-04-25 DIAGNOSIS — I10 Essential (primary) hypertension: Secondary | ICD-10-CM | POA: Diagnosis not present

## 2013-04-25 DIAGNOSIS — Z23 Encounter for immunization: Secondary | ICD-10-CM | POA: Diagnosis not present

## 2013-04-25 DIAGNOSIS — E1129 Type 2 diabetes mellitus with other diabetic kidney complication: Secondary | ICD-10-CM | POA: Diagnosis not present

## 2013-11-01 DIAGNOSIS — E119 Type 2 diabetes mellitus without complications: Secondary | ICD-10-CM | POA: Diagnosis not present

## 2013-11-01 DIAGNOSIS — I10 Essential (primary) hypertension: Secondary | ICD-10-CM | POA: Diagnosis not present

## 2013-11-01 DIAGNOSIS — Z79899 Other long term (current) drug therapy: Secondary | ICD-10-CM | POA: Diagnosis not present

## 2013-11-01 DIAGNOSIS — I2510 Atherosclerotic heart disease of native coronary artery without angina pectoris: Secondary | ICD-10-CM | POA: Diagnosis not present

## 2013-11-09 DIAGNOSIS — E1129 Type 2 diabetes mellitus with other diabetic kidney complication: Secondary | ICD-10-CM | POA: Diagnosis not present

## 2013-11-09 DIAGNOSIS — E785 Hyperlipidemia, unspecified: Secondary | ICD-10-CM | POA: Diagnosis not present

## 2013-11-09 DIAGNOSIS — I10 Essential (primary) hypertension: Secondary | ICD-10-CM | POA: Diagnosis not present

## 2014-02-06 DIAGNOSIS — E119 Type 2 diabetes mellitus without complications: Secondary | ICD-10-CM | POA: Diagnosis not present

## 2014-02-13 DIAGNOSIS — E1129 Type 2 diabetes mellitus with other diabetic kidney complication: Secondary | ICD-10-CM | POA: Diagnosis not present

## 2014-02-13 DIAGNOSIS — I10 Essential (primary) hypertension: Secondary | ICD-10-CM | POA: Diagnosis not present

## 2014-02-13 DIAGNOSIS — I2510 Atherosclerotic heart disease of native coronary artery without angina pectoris: Secondary | ICD-10-CM | POA: Diagnosis not present

## 2014-02-13 DIAGNOSIS — Z23 Encounter for immunization: Secondary | ICD-10-CM | POA: Diagnosis not present

## 2014-05-14 DIAGNOSIS — E119 Type 2 diabetes mellitus without complications: Secondary | ICD-10-CM | POA: Diagnosis not present

## 2014-05-22 DIAGNOSIS — E785 Hyperlipidemia, unspecified: Secondary | ICD-10-CM | POA: Diagnosis not present

## 2014-05-22 DIAGNOSIS — E1129 Type 2 diabetes mellitus with other diabetic kidney complication: Secondary | ICD-10-CM | POA: Diagnosis not present

## 2014-05-22 DIAGNOSIS — I10 Essential (primary) hypertension: Secondary | ICD-10-CM | POA: Diagnosis not present

## 2014-09-20 ENCOUNTER — Encounter: Payer: Self-pay | Admitting: Internal Medicine

## 2014-12-06 ENCOUNTER — Encounter: Payer: Self-pay | Admitting: Gastroenterology

## 2014-12-06 ENCOUNTER — Ambulatory Visit (INDEPENDENT_AMBULATORY_CARE_PROVIDER_SITE_OTHER): Payer: Medicare Other | Admitting: Gastroenterology

## 2014-12-06 ENCOUNTER — Other Ambulatory Visit: Payer: Self-pay

## 2014-12-06 VITALS — BP 158/71 | HR 61 | Temp 97.6°F | Ht 70.0 in | Wt 231.6 lb

## 2014-12-06 DIAGNOSIS — Z86010 Personal history of colonic polyps: Secondary | ICD-10-CM

## 2014-12-06 MED ORDER — PEG 3350-KCL-NA BICARB-NACL 420 G PO SOLR: 4000.0000 mL | Freq: Once | ORAL | Status: DC

## 2014-12-06 NOTE — Patient Instructions (Addendum)
We have scheduled you for a colonoscopy in the near future with Dr. [name].  No diabetes medication the day of the procedure.  Take 1/2 dose of Lantus the evening before.

## 2014-12-06 NOTE — Progress Notes (Signed)
Primary Care Physician:  Asencion Noble, MD Primary Gastroenterologist:  Dr. [name]   Chief Complaint  Patient presents with  . Colonoscopy    HPI:   Jonathan Avery is a 81 y.o. male presenting today to schedule surveillance colonoscopy. He has a history of a large villous adenoma, requiring hemicolectomy in 2007, with last colonoscopy in 2011 with diminutive, benign polypoid polyps. Due for 5-year-surveillance.   No constipation, diarrhea. No rectal bleeding. No melena. No abdominal pain. Rare reflux. No dysphagia.   Past Medical History  Diagnosis Date  . Diabetes   . Hypertension   . BPH (benign prostatic hypertrophy)     Past Surgical History  Procedure Laterality Date  . Colonoscopy  07/20/2006    Dr. [name]:Status post right hemicolectomy. Residual colonic mucosa appeared normal, normal rectum.   . Cholecystectomy  1973  . Colonoscopy  2006/2007    sprawling villous adenoma at ileocecal valve  . Open hemicolectomy  2007    due to villous adenoma  . Colonoscopy  2011    Dr. [name]: normal rectum, pancolonic diverticulosis, 2 diminutive polyps, with path benign polypoid colonic mucosa    Current Outpatient Prescriptions  Medication Sig Dispense Refill  . amLODipine (NORVASC) 5 MG tablet Take 5 mg by mouth daily.     Marland Kitchen aspirin 81 MG tablet Take 81 mg by mouth daily.    Marland Kitchen glipiZIDE (GLUCOTROL XL) 5 MG 24 hr tablet Take 5 mg by mouth daily with breakfast.     . LANTUS SOLOSTAR 100 UNIT/ML Solostar Pen Inject 65 Units into the skin daily at 10 pm.     . losartan-hydrochlorothiazide (HYZAAR) 50-12.5 MG per tablet Take 1 tablet by mouth daily.     . metFORMIN (GLUCOPHAGE) 1000 MG tablet Take 1,000 mg by mouth 2 (two) times daily with a meal.     . simvastatin (ZOCOR) 20 MG tablet Take 20 mg by mouth daily at 6 PM.     . tamsulosin (FLOMAX) 0.4 MG CAPS capsule Take 0.4 mg by mouth daily.     . polyethylene glycol-electrolytes (NULYTELY/GOLYTELY) 420 G solution Take 4,000 mLs  by mouth once. 4000 mL 0   No current facility-administered medications for this visit.    Allergies as of 12/06/2014  . (Not on File)    Family History  Problem Relation Age of Onset  . Colon cancer Neg Hx     History   Social History  . Marital Status: Married    Spouse Name: N/A  . Number of Children: N/A  . Years of Education: N/A   Occupational History  . Not on file.   Social History Main Topics  . Smoking status: Never Smoker   . Smokeless tobacco: Not on file  . Alcohol Use: No  . Drug Use: No  . Sexual Activity: Not on file   Other Topics Concern  . Not on file   Social History Narrative    Review of Systems: Gen: intermittent fatigue CV: Denies chest pain, heart palpitations, peripheral edema, syncope.  Resp: Denies shortness of breath at rest or with exertion. Denies wheezing or cough.  GI: see HPI GU : Denies urinary burning, urinary frequency, urinary hesitancy MS: Denies joint pain, muscle weakness, cramps, or limitation of movement.  Derm: Denies rash, itching, dry skin Psych: Denies depression, anxiety, memory loss, and confusion Heme: Denies bruising, bleeding, and enlarged lymph nodes.  Physical Exam: BP 158/71 mmHg  Pulse 61  Temp(Src) 97.6 F (36.4 C) (  Oral)  Ht 5' 10"  (1.778 m)  Wt 231 lb 9.6 oz (105.053 kg)  BMI 33.23 kg/m2 General:   Alert and oriented. Pleasant and cooperative. Well-nourished and well-developed.  Head:  Normocephalic and atraumatic. Eyes:  Without icterus, sclera clear and conjunctiva pink.  Ears:  Normal auditory acuity. Nose:  No deformity, discharge,  or lesions. Mouth:  No deformity or lesions, oral mucosa pink.  Lungs:  Clear to auscultation bilaterally. No wheezes, rales, or rhonchi. No distress.  Heart:  S1, S2 present without murmurs appreciated.  Abdomen:  +BS, soft, non-tender and non-distended. Protuberant, possible ventral hernia. Large AP diameter. No HSM noted. No guarding or rebound. Msk:   Symmetrical without gross deformities. Normal posture. Extremities:  Without  edema. Neurologic:  Alert and  oriented x4;  grossly normal neurologically. Skin:  Intact without significant lesions or rashes. Psych:  Alert and cooperative. Normal mood and affect.

## 2014-12-07 ENCOUNTER — Encounter: Payer: Self-pay | Admitting: Gastroenterology

## 2014-12-08 NOTE — Assessment & Plan Note (Signed)
81 year old male with history of large villous adenoma in 2007 s/p hemicolectomy, due for routine surveillance colonoscopy now. Last colonoscopy in 2011 as noted above. No concerning lower or upper GI symptoms.   Proceed with colonoscopy with Dr. [name]  in the near future. The risks, benefits, and alternatives have been discussed in detail with the patient. They state understanding and desire to proceed.

## 2014-12-10 NOTE — Progress Notes (Signed)
cc'ed to pcp °

## 2015-01-02 ENCOUNTER — Encounter (HOSPITAL_COMMUNITY)
Admission: RE | Disposition: A | Discharge status: Home / Self Care | Payer: Self-pay | Source: Ambulatory Visit | Attending: Internal Medicine

## 2015-01-02 ENCOUNTER — Encounter (HOSPITAL_COMMUNITY): Payer: Self-pay | Admitting: *Deleted

## 2015-01-02 ENCOUNTER — Ambulatory Visit (HOSPITAL_COMMUNITY)
Admission: RE | Admit: 2015-01-02 | Discharge: 2015-01-02 | Disposition: A | Discharge status: Home / Self Care | Payer: Medicare Other | Source: Ambulatory Visit | Attending: Internal Medicine | Admitting: Internal Medicine

## 2015-01-02 DIAGNOSIS — D128 Benign neoplasm of rectum: Secondary | ICD-10-CM | POA: Diagnosis not present

## 2015-01-02 DIAGNOSIS — Z7982 Long term (current) use of aspirin: Secondary | ICD-10-CM | POA: Insufficient documentation

## 2015-01-02 DIAGNOSIS — Z794 Long term (current) use of insulin: Secondary | ICD-10-CM | POA: Insufficient documentation

## 2015-01-02 DIAGNOSIS — Z1211 Encounter for screening for malignant neoplasm of colon: Secondary | ICD-10-CM | POA: Insufficient documentation

## 2015-01-02 DIAGNOSIS — E119 Type 2 diabetes mellitus without complications: Secondary | ICD-10-CM | POA: Diagnosis not present

## 2015-01-02 DIAGNOSIS — Z9049 Acquired absence of other specified parts of digestive tract: Secondary | ICD-10-CM | POA: Diagnosis not present

## 2015-01-02 DIAGNOSIS — K5730 Diverticulosis of large intestine without perforation or abscess without bleeding: Secondary | ICD-10-CM | POA: Insufficient documentation

## 2015-01-02 DIAGNOSIS — Z860100 Personal history of colon polyps, unspecified: Secondary | ICD-10-CM | POA: Insufficient documentation

## 2015-01-02 DIAGNOSIS — N400 Enlarged prostate without lower urinary tract symptoms: Secondary | ICD-10-CM | POA: Insufficient documentation

## 2015-01-02 DIAGNOSIS — I10 Essential (primary) hypertension: Secondary | ICD-10-CM | POA: Diagnosis not present

## 2015-01-02 DIAGNOSIS — D126 Benign neoplasm of colon, unspecified: Secondary | ICD-10-CM | POA: Diagnosis not present

## 2015-01-02 HISTORY — PX: COLONOSCOPY: SHX5424

## 2015-01-02 HISTORY — DX: Acute myocardial infarction, unspecified: I21.9

## 2015-01-02 LAB — GLUCOSE, CAPILLARY: GLUCOSE-CAPILLARY: 99 mg/dL (ref 65–99)

## 2015-01-02 SURGERY — COLONOSCOPY
Anesthesia: Moderate Sedation

## 2015-01-02 MED ORDER — MEPERIDINE HCL 100 MG/ML IJ SOLN
INTRAMUSCULAR | Status: DC | PRN
  Administered 2015-01-02: 50 mg via INTRAVENOUS

## 2015-01-02 MED ORDER — ONDANSETRON HCL 4 MG/2ML IJ SOLN
INTRAMUSCULAR | Status: AC
  Filled 2015-01-02: qty 2

## 2015-01-02 MED ORDER — SODIUM CHLORIDE 0.9 % IV SOLN
INTRAVENOUS | Status: DC
  Administered 2015-01-02: 1000 mL via INTRAVENOUS

## 2015-01-02 MED ORDER — MIDAZOLAM HCL 5 MG/5ML IJ SOLN
INTRAMUSCULAR | Status: DC | PRN
  Administered 2015-01-02: 2 mg via INTRAVENOUS

## 2015-01-02 MED ORDER — MEPERIDINE HCL 100 MG/ML IJ SOLN
INTRAMUSCULAR | Status: AC
  Filled 2015-01-02: qty 2

## 2015-01-02 MED ORDER — SIMETHICONE 40 MG/0.6ML PO SUSP
ORAL | Status: DC | PRN
  Administered 2015-01-02: 13:00:00

## 2015-01-02 MED ORDER — MIDAZOLAM HCL 5 MG/5ML IJ SOLN
INTRAMUSCULAR | Status: AC
  Filled 2015-01-02: qty 10

## 2015-01-02 MED ORDER — MIDAZOLAM HCL 10 MG/2ML IJ SOLN: INTRAMUSCULAR | Status: DC | PRN

## 2015-01-02 NOTE — H&P (View-Only) (Signed)
Primary Care Physician:  Asencion Noble, MD Primary Gastroenterologist:  Dr. [name]   Chief Complaint  Patient presents with  . Colonoscopy    HPI:   Jonathan Avery is a 81 y.o. male presenting today to schedule surveillance colonoscopy. He has a history of a large villous adenoma, requiring hemicolectomy in 2007, with last colonoscopy in 2011 with diminutive, benign polypoid polyps. Due for 5-year-surveillance.   No constipation, diarrhea. No rectal bleeding. No melena. No abdominal pain. Rare reflux. No dysphagia.   Past Medical History  Diagnosis Date  . Diabetes   . Hypertension   . BPH (benign prostatic hypertrophy)     Past Surgical History  Procedure Laterality Date  . Colonoscopy  07/20/2006    Dr. [name]:Status post right hemicolectomy. Residual colonic mucosa appeared normal, normal rectum.   . Cholecystectomy  1973  . Colonoscopy  2006/2007    sprawling villous adenoma at ileocecal valve  . Open hemicolectomy  2007    due to villous adenoma  . Colonoscopy  2011    Dr. [name]: normal rectum, pancolonic diverticulosis, 2 diminutive polyps, with path benign polypoid colonic mucosa    Current Outpatient Prescriptions  Medication Sig Dispense Refill  . amLODipine (NORVASC) 5 MG tablet Take 5 mg by mouth daily.     Marland Kitchen aspirin 81 MG tablet Take 81 mg by mouth daily.    Marland Kitchen glipiZIDE (GLUCOTROL XL) 5 MG 24 hr tablet Take 5 mg by mouth daily with breakfast.     . LANTUS SOLOSTAR 100 UNIT/ML Solostar Pen Inject 65 Units into the skin daily at 10 pm.     . losartan-hydrochlorothiazide (HYZAAR) 50-12.5 MG per tablet Take 1 tablet by mouth daily.     . metFORMIN (GLUCOPHAGE) 1000 MG tablet Take 1,000 mg by mouth 2 (two) times daily with a meal.     . simvastatin (ZOCOR) 20 MG tablet Take 20 mg by mouth daily at 6 PM.     . tamsulosin (FLOMAX) 0.4 MG CAPS capsule Take 0.4 mg by mouth daily.     . polyethylene glycol-electrolytes (NULYTELY/GOLYTELY) 420 G solution Take 4,000 mLs  by mouth once. 4000 mL 0   No current facility-administered medications for this visit.    Allergies as of 12/06/2014  . (Not on File)    Family History  Problem Relation Age of Onset  . Colon cancer Neg Hx     History   Social History  . Marital Status: Married    Spouse Name: N/A  . Number of Children: N/A  . Years of Education: N/A   Occupational History  . Not on file.   Social History Main Topics  . Smoking status: Never Smoker   . Smokeless tobacco: Not on file  . Alcohol Use: No  . Drug Use: No  . Sexual Activity: Not on file   Other Topics Concern  . Not on file   Social History Narrative    Review of Systems: Gen: intermittent fatigue CV: Denies chest pain, heart palpitations, peripheral edema, syncope.  Resp: Denies shortness of breath at rest or with exertion. Denies wheezing or cough.  GI: see HPI GU : Denies urinary burning, urinary frequency, urinary hesitancy MS: Denies joint pain, muscle weakness, cramps, or limitation of movement.  Derm: Denies rash, itching, dry skin Psych: Denies depression, anxiety, memory loss, and confusion Heme: Denies bruising, bleeding, and enlarged lymph nodes.  Physical Exam: BP 158/71 mmHg  Pulse 61  Temp(Src) 97.6 F (36.4 C) (  Oral)  Ht 5' 10"  (1.778 m)  Wt 231 lb 9.6 oz (105.053 kg)  BMI 33.23 kg/m2 General:   Alert and oriented. Pleasant and cooperative. Well-nourished and well-developed.  Head:  Normocephalic and atraumatic. Eyes:  Without icterus, sclera clear and conjunctiva pink.  Ears:  Normal auditory acuity. Nose:  No deformity, discharge,  or lesions. Mouth:  No deformity or lesions, oral mucosa pink.  Lungs:  Clear to auscultation bilaterally. No wheezes, rales, or rhonchi. No distress.  Heart:  S1, S2 present without murmurs appreciated.  Abdomen:  +BS, soft, non-tender and non-distended. Protuberant, possible ventral hernia. Large AP diameter. No HSM noted. No guarding or rebound. Msk:   Symmetrical without gross deformities. Normal posture. Extremities:  Without  edema. Neurologic:  Alert and  oriented x4;  grossly normal neurologically. Skin:  Intact without significant lesions or rashes. Psych:  Alert and cooperative. Normal mood and affect.

## 2015-01-02 NOTE — Op Note (Signed)
[facility] [address] [place], [address]   COLONOSCOPY PROCEDURE REPORT  PATIENT: Shaune, Westfall  MR#: [id number] BIRTHDATE: 08-Apr-1931 , 89  yrs. old GENDER: male ENDOSCOPIST: R.  Garfield Cornea, MD FACP [facility] REFERRED BY:Roy Willey Blade, M.D. PROCEDURE DATE:  01/14/2015 PROCEDURE:   Colonoscopy with snare polypectomy INDICATIONS:History of advanced adenoma requiring right hemicolectomy; surveillance examination today. MEDICATIONS: Versed 5 mg IV and Demerol 50 mg IV in divided doses. Zofran 4 mg IV. ASA CLASS:       Class III  CONSENT: The risks, benefits, alternatives and imponderables including but not limited to bleeding, perforation as well as the possibility of a missed lesion have been reviewed.  The potential for biopsy, lesion removal, etc. have also been discussed. Questions have been answered.  All parties agreeable.  Please see the history and physical in the medical record for more information.  DESCRIPTION OF PROCEDURE:   After the risks benefits and alternatives of the procedure were thoroughly explained, informed consent was obtained.  The digital rectal exam revealed no abnormalities of the rectum.   The EC-[id number] ([id number])  endoscope was introduced through the anus and advanced to the surgical anastomosis. No adverse events experienced.   The quality of the prep was adequate  The instrument was then slowly withdrawn as the colon was fully examined. Estimated blood loss is zero unless otherwise noted in this procedure report.      COLON FINDINGS: (1) 7 mm sessile polyp in the rectum at 4 cm from anal verge; otherwise, the remainder of the rectal mucosa appeared normal.  The patient had scattered residual colonic diverticulosis.  Surgical anastomosis identified.  The patient had (2) 4 mm polyps at the surgical anastomosis; otherwise, the remainder of the colonic mucosa appeared normal.  the rectal polyps hot snare removed and the 2 polyps  at the anastomosis were cold snare removed.  Retroflexion was performed. .   Withdrawal time=13 minutes 0 seconds(from anastomosis?"cecum not present).  The scope was withdrawn and the procedure completed. COMPLICATIONS: There were no immediate complications.  ENDOSCOPIC IMPRESSION: Colonic diverticulosis. Rectal and colonic polyps?"removed as described above. Status post right hemicolectomy  RECOMMENDATIONS: Follow up on pathology.  eSigned:  R. Garfield Cornea, MD [facility] 2015/01/14 1:42 PM   cc:  CPT CODES: ICD CODES:  The ICD and CPT codes recommended by this software are interpretations from the data that the clinical staff has captured with the software.  The verification of the translation of this report to the ICD and CPT codes and modifiers is the sole responsibility of the health care institution and practicing physician where this report was generated.  [place]. will not be held responsible for the validity of the ICD and CPT codes included on this report.  AMA assumes no liability for data contained or not contained herein. CPT is a Designer, television/film set of the [employer].  PATIENT NAME:  Zaion, Hreha MR#: [id number]

## 2015-01-02 NOTE — Interval H&P Note (Signed)
History and Physical Interval Note:  01/02/2015 12:57 PM  Jonathan Avery  has presented today for surgery, with the diagnosis of hx of colon polyps  The various methods of treatment have been discussed with the patient and family. After consideration of risks, benefits and other options for treatment, the patient has consented to  Procedure(s) with comments: COLONOSCOPY (N/A) - 9166 as a surgical intervention .  The patient's history has been reviewed, patient examined, no change in status, stable for surgery.  I have reviewed the patient's chart and labs.  Questions were answered to the patient's satisfaction.     Delynn Olvera   No change. Surveillance colonoscopy per plan.The risks, benefits, limitations, alternatives and imponderables have been reviewed with the patient. Questions have been answered. All parties are agreeable.

## 2015-01-02 NOTE — Discharge Instructions (Signed)
Colonoscopy Discharge Instructions  Read the instructions outlined below and refer to this sheet in the next few weeks. These discharge instructions provide you with general information on caring for yourself after you leave the hospital. Your doctor may also give you specific instructions. While your treatment has been planned according to the most current medical practices available, unavoidable complications occasionally occur. If you have any problems or questions after discharge, call Dr. [name] at [phone]. ACTIVITY  You may resume your regular activity, but move at a slower pace for the next 24 hours.   Take frequent rest periods for the next 24 hours.   Walking will help get rid of the air and reduce the bloated feeling in your belly (abdomen).   No driving for 24 hours (because of the medicine (anesthesia) used during the test).    Do not sign any important legal documents or operate any machinery for 24 hours (because of the anesthesia used during the test).  NUTRITION  Drink plenty of fluids.   You may resume your normal diet as instructed by your doctor.   Begin with a light meal and progress to your normal diet. Heavy or fried foods are harder to digest and may make you feel sick to your stomach (nauseated).   Avoid alcoholic beverages for 24 hours or as instructed.  MEDICATIONS  You may resume your normal medications unless your doctor tells you otherwise.  WHAT YOU CAN EXPECT TODAY  Some feelings of bloating in the abdomen.   Passage of more gas than usual.   Spotting of blood in your stool or on the toilet paper.  IF YOU HAD POLYPS REMOVED DURING THE COLONOSCOPY:  No aspirin products for 7 days or as instructed.   No alcohol for 7 days or as instructed.   Eat a soft diet for the next 24 hours.  FINDING OUT THE RESULTS OF YOUR TEST Not all test results are available during your visit. If your test results are not back during the visit, make an appointment  with your caregiver to find out the results. Do not assume everything is normal if you have not heard from your caregiver or the medical facility. It is important for you to follow up on all of your test results.  SEEK IMMEDIATE MEDICAL ATTENTION IF:  You have more than a spotting of blood in your stool.   Your belly is swollen (abdominal distention).   You are nauseated or vomiting.   You have a temperature over 101.   You have abdominal pain or discomfort that is severe or gets worse throughout the day.    Colon polyps and diverticulosis information provided  Further recommendations to follow pending review of pathology report      Colon Polyps Polyps are lumps of extra tissue growing inside the body. Polyps can grow in the large intestine (colon). Most colon polyps are noncancerous (benign). However, some colon polyps can become cancerous over time. Polyps that are larger than a pea may be harmful. To be safe, caregivers remove and test all polyps. CAUSES  Polyps form when mutations in the genes cause your cells to grow and divide even though no more tissue is needed. RISK FACTORS There are a number of risk factors that can increase your chances of getting colon polyps. They include:  Being older than 50 years.  Family history of colon polyps or colon cancer.  Long-term colon diseases, such as colitis or Crohn disease.  Being overweight.  Smoking.  Being  inactive.  Drinking too much alcohol. SYMPTOMS  Most small polyps do not cause symptoms. If symptoms are present, they may include:  Blood in the stool. The stool may look dark red or black.  Constipation or diarrhea that lasts longer than 1 week. DIAGNOSIS People often do not know they have polyps until their caregiver finds them during a regular checkup. Your caregiver can use 4 tests to check for polyps:  Digital rectal exam. The caregiver wears gloves and feels inside the rectum. This test would find polyps  only in the rectum.  Barium enema. The caregiver puts a liquid called barium into your rectum before taking X-rays of your colon. Barium makes your colon look white. Polyps are dark, so they are easy to see in the X-ray pictures.  Sigmoidoscopy. A thin, flexible tube (sigmoidoscope) is placed into your rectum. The sigmoidoscope has a light and tiny camera in it. The caregiver uses the sigmoidoscope to look at the last third of your colon.  Colonoscopy. This test is like sigmoidoscopy, but the caregiver looks at the entire colon. This is the most common method for finding and removing polyps. TREATMENT  Any polyps will be removed during a sigmoidoscopy or colonoscopy. The polyps are then tested for cancer. PREVENTION  To help lower your risk of getting more colon polyps:  Eat plenty of fruits and vegetables. Avoid eating fatty foods.  Do not smoke.  Avoid drinking alcohol.  Exercise every day.  Lose weight if recommended by your caregiver.  Eat plenty of calcium and folate. Foods that are rich in calcium include milk, cheese, and broccoli. Foods that are rich in folate include chickpeas, kidney beans, and spinach. HOME CARE INSTRUCTIONS Keep all follow-up appointments as directed by your caregiver. You may need periodic exams to check for polyps. SEEK MEDICAL CARE IF: You notice bleeding during a bowel movement. Document Released: 02/05/2004 Document Revised: 08/03/2011 Document Reviewed: 07/21/2011 [facility] Patient Information 2015 [place], [place]. This information is not intended to replace advice given to you by your health care provider. Make sure you discuss any questions you have with your health care provider.      Diverticulosis Diverticulosis is the condition that develops when small pouches (diverticula) form in the wall of your colon. Your colon, or large intestine, is where water is absorbed and stool is formed. The pouches form when the inside layer of your colon pushes  through weak spots in the outer layers of your colon. CAUSES  No one knows exactly what causes diverticulosis. RISK FACTORS  Being older than 62. Your risk for this condition increases with age. Diverticulosis is rare in people younger than 40 years. By age 71, almost everyone has it.  Eating a low-fiber diet.  Being frequently constipated.  Being overweight.  Not getting enough exercise.  Smoking.  Taking over-the-counter pain medicines, like aspirin and ibuprofen. SYMPTOMS  Most people with diverticulosis do not have symptoms. DIAGNOSIS  Because diverticulosis often has no symptoms, health care providers often discover the condition during an exam for other colon problems. In many cases, a health care provider will diagnose diverticulosis while using a flexible scope to examine the colon (colonoscopy). TREATMENT  If you have never developed an infection related to diverticulosis, you may not need treatment. If you have had an infection before, treatment may include:  Eating more fruits, vegetables, and grains.  Taking a fiber supplement.  Taking a live bacteria supplement (probiotic).  Taking medicine to relax your colon. HOME CARE INSTRUCTIONS  Drink at least 6-8 glasses of water each day to prevent constipation.  Try not to strain when you have a bowel movement.  Keep all follow-up appointments. If you have had an infection before:  Increase the fiber in your diet as directed by your health care provider or dietitian.  Take a dietary fiber supplement if your health care provider approves.  Only take medicines as directed by your health care provider. SEEK MEDICAL CARE IF:   You have abdominal pain.  You have bloating.  You have cramps.  You have not gone to the bathroom in 3 days. SEEK IMMEDIATE MEDICAL CARE IF:   Your pain gets worse.  Yourbloating becomes very bad.  You have a fever or chills, and your symptoms suddenly get worse.  You begin  vomiting.  You have bowel movements that are bloody or black. MAKE SURE YOU:  Understand these instructions.  Will watch your condition.  Will get help right away if you are not doing well or get worse. Document Released: 02/06/2004 Document Revised: 05/16/2013 Document Reviewed: 04/05/2013 [facility] Patient Information 2015 [place], [place]. This information is not intended to replace advice given to you by your health care provider. Make sure you discuss any questions you have with your health care provider.

## 2015-01-05 ENCOUNTER — Encounter: Payer: Self-pay | Admitting: Internal Medicine

## 2015-01-07 ENCOUNTER — Encounter (HOSPITAL_COMMUNITY): Payer: Self-pay | Admitting: Internal Medicine

## 2015-05-06 ENCOUNTER — Ambulatory Visit (HOSPITAL_COMMUNITY)
Admission: RE | Admit: 2015-05-06 | Discharge: 2015-05-06 | Disposition: A | Discharge status: Home / Self Care | Payer: Medicare Other | Source: Ambulatory Visit | Attending: Pulmonary Disease | Admitting: Pulmonary Disease

## 2015-05-06 ENCOUNTER — Other Ambulatory Visit (HOSPITAL_COMMUNITY): Payer: Self-pay | Admitting: Pulmonary Disease

## 2015-05-06 DIAGNOSIS — Z87891 Personal history of nicotine dependence: Secondary | ICD-10-CM | POA: Insufficient documentation

## 2015-05-06 DIAGNOSIS — R05 Cough: Secondary | ICD-10-CM | POA: Insufficient documentation

## 2015-05-06 DIAGNOSIS — R059 Cough, unspecified: Secondary | ICD-10-CM

## 2015-05-06 DIAGNOSIS — I252 Old myocardial infarction: Secondary | ICD-10-CM | POA: Insufficient documentation

## 2015-07-17 DIAGNOSIS — E119 Type 2 diabetes mellitus without complications: Secondary | ICD-10-CM | POA: Diagnosis not present

## 2015-07-24 DIAGNOSIS — Z6835 Body mass index (BMI) 35.0-35.9, adult: Secondary | ICD-10-CM | POA: Diagnosis not present

## 2015-07-24 DIAGNOSIS — E1129 Type 2 diabetes mellitus with other diabetic kidney complication: Secondary | ICD-10-CM | POA: Diagnosis not present

## 2015-07-24 DIAGNOSIS — I10 Essential (primary) hypertension: Secondary | ICD-10-CM | POA: Diagnosis not present

## 2015-10-11 DIAGNOSIS — H6121 Impacted cerumen, right ear: Secondary | ICD-10-CM | POA: Diagnosis not present

## 2015-10-11 DIAGNOSIS — H903 Sensorineural hearing loss, bilateral: Secondary | ICD-10-CM | POA: Diagnosis not present

## 2015-10-22 DIAGNOSIS — E119 Type 2 diabetes mellitus without complications: Secondary | ICD-10-CM | POA: Diagnosis not present

## 2015-10-29 DIAGNOSIS — E1129 Type 2 diabetes mellitus with other diabetic kidney complication: Secondary | ICD-10-CM | POA: Diagnosis not present

## 2015-10-29 DIAGNOSIS — I10 Essential (primary) hypertension: Secondary | ICD-10-CM | POA: Diagnosis not present

## 2016-01-30 DIAGNOSIS — E785 Hyperlipidemia, unspecified: Secondary | ICD-10-CM | POA: Diagnosis not present

## 2016-01-30 DIAGNOSIS — Z79899 Other long term (current) drug therapy: Secondary | ICD-10-CM | POA: Diagnosis not present

## 2016-01-30 DIAGNOSIS — I10 Essential (primary) hypertension: Secondary | ICD-10-CM | POA: Diagnosis not present

## 2016-01-30 DIAGNOSIS — E119 Type 2 diabetes mellitus without complications: Secondary | ICD-10-CM | POA: Diagnosis not present

## 2016-01-30 DIAGNOSIS — I2510 Atherosclerotic heart disease of native coronary artery without angina pectoris: Secondary | ICD-10-CM | POA: Diagnosis not present

## 2016-01-30 DIAGNOSIS — R809 Proteinuria, unspecified: Secondary | ICD-10-CM | POA: Diagnosis not present

## 2016-02-04 DIAGNOSIS — E785 Hyperlipidemia, unspecified: Secondary | ICD-10-CM | POA: Diagnosis not present

## 2016-02-04 DIAGNOSIS — I2510 Atherosclerotic heart disease of native coronary artery without angina pectoris: Secondary | ICD-10-CM | POA: Diagnosis not present

## 2016-02-04 DIAGNOSIS — Z23 Encounter for immunization: Secondary | ICD-10-CM | POA: Diagnosis not present

## 2016-02-04 DIAGNOSIS — E1129 Type 2 diabetes mellitus with other diabetic kidney complication: Secondary | ICD-10-CM | POA: Diagnosis not present

## 2016-04-10 DIAGNOSIS — J019 Acute sinusitis, unspecified: Secondary | ICD-10-CM | POA: Diagnosis not present

## 2016-04-29 DIAGNOSIS — E119 Type 2 diabetes mellitus without complications: Secondary | ICD-10-CM | POA: Diagnosis not present

## 2016-05-07 DIAGNOSIS — I2510 Atherosclerotic heart disease of native coronary artery without angina pectoris: Secondary | ICD-10-CM | POA: Diagnosis not present

## 2016-05-07 DIAGNOSIS — E1129 Type 2 diabetes mellitus with other diabetic kidney complication: Secondary | ICD-10-CM | POA: Diagnosis not present

## 2016-08-07 DIAGNOSIS — E119 Type 2 diabetes mellitus without complications: Secondary | ICD-10-CM | POA: Diagnosis not present

## 2016-08-14 DIAGNOSIS — E1129 Type 2 diabetes mellitus with other diabetic kidney complication: Secondary | ICD-10-CM | POA: Diagnosis not present

## 2016-08-14 DIAGNOSIS — I2510 Atherosclerotic heart disease of native coronary artery without angina pectoris: Secondary | ICD-10-CM | POA: Diagnosis not present

## 2016-08-14 DIAGNOSIS — Z6833 Body mass index (BMI) 33.0-33.9, adult: Secondary | ICD-10-CM | POA: Diagnosis not present

## 2016-10-29 DIAGNOSIS — L570 Actinic keratosis: Secondary | ICD-10-CM | POA: Diagnosis not present

## 2016-10-29 DIAGNOSIS — C44519 Basal cell carcinoma of skin of other part of trunk: Secondary | ICD-10-CM | POA: Diagnosis not present

## 2016-10-29 DIAGNOSIS — X32XXXA Exposure to sunlight, initial encounter: Secondary | ICD-10-CM | POA: Diagnosis not present

## 2016-11-04 DIAGNOSIS — E119 Type 2 diabetes mellitus without complications: Secondary | ICD-10-CM | POA: Diagnosis not present

## 2016-11-11 DIAGNOSIS — I2510 Atherosclerotic heart disease of native coronary artery without angina pectoris: Secondary | ICD-10-CM | POA: Diagnosis not present

## 2016-11-11 DIAGNOSIS — I10 Essential (primary) hypertension: Secondary | ICD-10-CM | POA: Diagnosis not present

## 2016-11-11 DIAGNOSIS — E1129 Type 2 diabetes mellitus with other diabetic kidney complication: Secondary | ICD-10-CM | POA: Diagnosis not present

## 2016-12-03 DIAGNOSIS — Z08 Encounter for follow-up examination after completed treatment for malignant neoplasm: Secondary | ICD-10-CM | POA: Diagnosis not present

## 2016-12-03 DIAGNOSIS — X32XXXD Exposure to sunlight, subsequent encounter: Secondary | ICD-10-CM | POA: Diagnosis not present

## 2016-12-03 DIAGNOSIS — Z85828 Personal history of other malignant neoplasm of skin: Secondary | ICD-10-CM | POA: Diagnosis not present

## 2016-12-03 DIAGNOSIS — L570 Actinic keratosis: Secondary | ICD-10-CM | POA: Diagnosis not present

## 2017-02-22 DIAGNOSIS — Z79899 Other long term (current) drug therapy: Secondary | ICD-10-CM | POA: Diagnosis not present

## 2017-02-22 DIAGNOSIS — I10 Essential (primary) hypertension: Secondary | ICD-10-CM | POA: Diagnosis not present

## 2017-02-22 DIAGNOSIS — I2510 Atherosclerotic heart disease of native coronary artery without angina pectoris: Secondary | ICD-10-CM | POA: Diagnosis not present

## 2017-02-22 DIAGNOSIS — R972 Elevated prostate specific antigen [PSA]: Secondary | ICD-10-CM | POA: Diagnosis not present

## 2017-02-22 DIAGNOSIS — E1129 Type 2 diabetes mellitus with other diabetic kidney complication: Secondary | ICD-10-CM | POA: Diagnosis not present

## 2017-03-01 DIAGNOSIS — Z0001 Encounter for general adult medical examination with abnormal findings: Secondary | ICD-10-CM | POA: Diagnosis not present

## 2017-03-01 DIAGNOSIS — I2510 Atherosclerotic heart disease of native coronary artery without angina pectoris: Secondary | ICD-10-CM | POA: Diagnosis not present

## 2017-03-01 DIAGNOSIS — E1129 Type 2 diabetes mellitus with other diabetic kidney complication: Secondary | ICD-10-CM | POA: Diagnosis not present

## 2017-03-01 DIAGNOSIS — Z23 Encounter for immunization: Secondary | ICD-10-CM | POA: Diagnosis not present

## 2017-03-01 DIAGNOSIS — I10 Essential (primary) hypertension: Secondary | ICD-10-CM | POA: Diagnosis not present

## 2017-03-01 DIAGNOSIS — R001 Bradycardia, unspecified: Secondary | ICD-10-CM | POA: Diagnosis not present

## 2017-04-28 DIAGNOSIS — H9201 Otalgia, right ear: Secondary | ICD-10-CM | POA: Diagnosis not present

## 2017-04-28 DIAGNOSIS — H6123 Impacted cerumen, bilateral: Secondary | ICD-10-CM | POA: Diagnosis not present

## 2017-05-11 DIAGNOSIS — H903 Sensorineural hearing loss, bilateral: Secondary | ICD-10-CM | POA: Diagnosis not present

## 2017-06-28 DIAGNOSIS — E1129 Type 2 diabetes mellitus with other diabetic kidney complication: Secondary | ICD-10-CM | POA: Diagnosis not present

## 2017-07-05 DIAGNOSIS — N183 Chronic kidney disease, stage 3 (moderate): Secondary | ICD-10-CM | POA: Diagnosis not present

## 2017-07-05 DIAGNOSIS — E1129 Type 2 diabetes mellitus with other diabetic kidney complication: Secondary | ICD-10-CM | POA: Diagnosis not present

## 2017-07-05 DIAGNOSIS — L209 Atopic dermatitis, unspecified: Secondary | ICD-10-CM | POA: Diagnosis not present

## 2017-07-05 DIAGNOSIS — I10 Essential (primary) hypertension: Secondary | ICD-10-CM | POA: Diagnosis not present

## 2017-10-28 DIAGNOSIS — L905 Scar conditions and fibrosis of skin: Secondary | ICD-10-CM | POA: Diagnosis not present

## 2017-11-03 DIAGNOSIS — E1129 Type 2 diabetes mellitus with other diabetic kidney complication: Secondary | ICD-10-CM | POA: Diagnosis not present

## 2017-11-10 DIAGNOSIS — E1129 Type 2 diabetes mellitus with other diabetic kidney complication: Secondary | ICD-10-CM | POA: Diagnosis not present

## 2017-11-10 DIAGNOSIS — I10 Essential (primary) hypertension: Secondary | ICD-10-CM | POA: Diagnosis not present

## 2017-11-10 DIAGNOSIS — R001 Bradycardia, unspecified: Secondary | ICD-10-CM | POA: Diagnosis not present

## 2017-11-29 ENCOUNTER — Observation Stay (HOSPITAL_COMMUNITY)
Admission: EM | Admit: 2017-11-29 | Discharge: 2017-11-30 | Disposition: A | Discharge status: Home / Self Care | Payer: Medicare HMO | Attending: Internal Medicine | Admitting: Internal Medicine

## 2017-11-29 ENCOUNTER — Encounter: Payer: Self-pay | Admitting: Internal Medicine

## 2017-11-29 ENCOUNTER — Observation Stay (HOSPITAL_COMMUNITY): Payer: Medicare HMO

## 2017-11-29 ENCOUNTER — Emergency Department (HOSPITAL_COMMUNITY): Payer: Medicare HMO

## 2017-11-29 ENCOUNTER — Encounter (HOSPITAL_COMMUNITY): Payer: Self-pay | Admitting: Emergency Medicine

## 2017-11-29 ENCOUNTER — Other Ambulatory Visit: Payer: Self-pay

## 2017-11-29 DIAGNOSIS — Z79899 Other long term (current) drug therapy: Secondary | ICD-10-CM | POA: Insufficient documentation

## 2017-11-29 DIAGNOSIS — I219 Acute myocardial infarction, unspecified: Secondary | ICD-10-CM

## 2017-11-29 DIAGNOSIS — K56609 Unspecified intestinal obstruction, unspecified as to partial versus complete obstruction: Secondary | ICD-10-CM

## 2017-11-29 DIAGNOSIS — I10 Essential (primary) hypertension: Secondary | ICD-10-CM

## 2017-11-29 DIAGNOSIS — N400 Enlarged prostate without lower urinary tract symptoms: Secondary | ICD-10-CM

## 2017-11-29 DIAGNOSIS — R109 Unspecified abdominal pain: Secondary | ICD-10-CM | POA: Diagnosis not present

## 2017-11-29 DIAGNOSIS — I252 Old myocardial infarction: Secondary | ICD-10-CM | POA: Insufficient documentation

## 2017-11-29 DIAGNOSIS — N183 Chronic kidney disease, stage 3 unspecified: Secondary | ICD-10-CM

## 2017-11-29 DIAGNOSIS — K5730 Diverticulosis of large intestine without perforation or abscess without bleeding: Secondary | ICD-10-CM | POA: Diagnosis not present

## 2017-11-29 DIAGNOSIS — K5000 Crohn's disease of small intestine without complications: Secondary | ICD-10-CM | POA: Diagnosis not present

## 2017-11-29 DIAGNOSIS — Z129 Encounter for screening for malignant neoplasm, site unspecified: Secondary | ICD-10-CM

## 2017-11-29 DIAGNOSIS — I129 Hypertensive chronic kidney disease with stage 1 through stage 4 chronic kidney disease, or unspecified chronic kidney disease: Secondary | ICD-10-CM | POA: Diagnosis not present

## 2017-11-29 DIAGNOSIS — K869 Disease of pancreas, unspecified: Secondary | ICD-10-CM | POA: Diagnosis not present

## 2017-11-29 DIAGNOSIS — E119 Type 2 diabetes mellitus without complications: Secondary | ICD-10-CM

## 2017-11-29 DIAGNOSIS — Z9049 Acquired absence of other specified parts of digestive tract: Secondary | ICD-10-CM | POA: Insufficient documentation

## 2017-11-29 DIAGNOSIS — K8689 Other specified diseases of pancreas: Secondary | ICD-10-CM | POA: Diagnosis not present

## 2017-11-29 DIAGNOSIS — Z794 Long term (current) use of insulin: Secondary | ICD-10-CM | POA: Diagnosis not present

## 2017-11-29 DIAGNOSIS — Z87891 Personal history of nicotine dependence: Secondary | ICD-10-CM | POA: Insufficient documentation

## 2017-11-29 DIAGNOSIS — R05 Cough: Secondary | ICD-10-CM | POA: Diagnosis not present

## 2017-11-29 DIAGNOSIS — Z7982 Long term (current) use of aspirin: Secondary | ICD-10-CM | POA: Insufficient documentation

## 2017-11-29 DIAGNOSIS — E785 Hyperlipidemia, unspecified: Secondary | ICD-10-CM

## 2017-11-29 DIAGNOSIS — R112 Nausea with vomiting, unspecified: Secondary | ICD-10-CM | POA: Diagnosis present

## 2017-11-29 HISTORY — DX: Pure hypercholesterolemia, unspecified: E78.00

## 2017-11-29 LAB — CBC WITH DIFFERENTIAL/PLATELET
Basophils Absolute: 0 10*3/uL (ref 0.0–0.1)
Basophils Relative: 0 %
EOS ABS: 0.1 10*3/uL (ref 0.0–0.7)
Eosinophils Relative: 1 %
HCT: 45 % (ref 39.0–52.0)
Hemoglobin: 15.7 g/dL (ref 13.0–17.0)
Lymphocytes Relative: 11 %
Lymphs Abs: 1.5 10*3/uL (ref 0.7–4.0)
MCH: 31 pg (ref 26.0–34.0)
MCHC: 34.9 g/dL (ref 30.0–36.0)
MCV: 88.9 fL (ref 78.0–100.0)
Monocytes Absolute: 1.1 10*3/uL — ABNORMAL HIGH (ref 0.1–1.0)
Monocytes Relative: 8 %
Neutro Abs: 11.3 10*3/uL — ABNORMAL HIGH (ref 1.7–7.7)
Neutrophils Relative %: 80 %
Platelets: 176 10*3/uL (ref 150–400)
RBC: 5.06 MIL/uL (ref 4.22–5.81)
RDW: 13.3 % (ref 11.5–15.5)
WBC: 14 10*3/uL — ABNORMAL HIGH (ref 4.0–10.5)

## 2017-11-29 LAB — GLUCOSE, CAPILLARY
GLUCOSE-CAPILLARY: 145 mg/dL — AB (ref 70–99)
Glucose-Capillary: 65 mg/dL — ABNORMAL LOW (ref 70–99)
Glucose-Capillary: 85 mg/dL (ref 70–99)

## 2017-11-29 LAB — URINALYSIS, ROUTINE W REFLEX MICROSCOPIC
BILIRUBIN URINE: NEGATIVE
GLUCOSE, UA: NEGATIVE mg/dL
Hgb urine dipstick: NEGATIVE
Ketones, ur: NEGATIVE mg/dL
Leukocytes, UA: NEGATIVE
Nitrite: NEGATIVE
PROTEIN: 100 mg/dL — AB
Specific Gravity, Urine: 1.017 (ref 1.005–1.030)
pH: 5 (ref 5.0–8.0)

## 2017-11-29 LAB — COMPREHENSIVE METABOLIC PANEL
ALK PHOS: 49 U/L (ref 38–126)
ALT: 19 U/L (ref 0–44)
AST: 25 U/L (ref 15–41)
Albumin: 3.8 g/dL (ref 3.5–5.0)
Anion gap: 11 (ref 5–15)
BUN: 40 mg/dL — ABNORMAL HIGH (ref 8–23)
CALCIUM: 9.5 mg/dL (ref 8.9–10.3)
CHLORIDE: 103 mmol/L (ref 98–111)
CO2: 24 mmol/L (ref 22–32)
CREATININE: 1.47 mg/dL — AB (ref 0.61–1.24)
GFR calc Af Amer: 49 mL/min — ABNORMAL LOW (ref >60)
GFR calc non Af Amer: 42 mL/min — ABNORMAL LOW (ref >60)
Glucose, Bld: 188 mg/dL — ABNORMAL HIGH (ref 70–99)
Potassium: 4.2 mmol/L (ref 3.5–5.1)
SODIUM: 138 mmol/L (ref 135–145)
Total Bilirubin: 1 mg/dL (ref 0.3–1.2)
Total Protein: 6.9 g/dL (ref 6.5–8.1)

## 2017-11-29 LAB — LIPASE, BLOOD: Lipase: 28 U/L (ref 11–51)

## 2017-11-29 LAB — I-STAT CG4 LACTIC ACID, ED
Lactic Acid, Venous: 2.66 mmol/L (ref 0.5–1.9)
Lactic Acid, Venous: 1.71 mmol/L (ref 0.5–1.9)

## 2017-11-29 LAB — I-STAT TROPONIN, ED: Troponin i, poc: 0.01 ng/mL (ref 0.00–0.08)

## 2017-11-29 LAB — CBG MONITORING, ED: Glucose-Capillary: 158 mg/dL — ABNORMAL HIGH (ref 70–99)

## 2017-11-29 MED ORDER — ACETAMINOPHEN 650 MG RE SUPP: 650.0000 mg | Freq: Four times a day (QID) | RECTAL | Status: DC | PRN

## 2017-11-29 MED ORDER — GADOBENATE DIMEGLUMINE 529 MG/ML IV SOLN
10.0000 mL | Freq: Once | INTRAVENOUS | Status: AC | PRN
  Administered 2017-11-29: 10 mL via INTRAVENOUS

## 2017-11-29 MED ORDER — ENOXAPARIN SODIUM 40 MG/0.4ML ~~LOC~~ SOLN
40.0000 mg | SUBCUTANEOUS | Status: DC
  Administered 2017-11-29: 40 mg via SUBCUTANEOUS
  Filled 2017-11-29: qty 0.4

## 2017-11-29 MED ORDER — INSULIN ASPART 100 UNIT/ML ~~LOC~~ SOLN
0.0000 [IU] | Freq: Three times a day (TID) | SUBCUTANEOUS | Status: DC
  Administered 2017-11-30: 3 [IU] via SUBCUTANEOUS

## 2017-11-29 MED ORDER — ONDANSETRON HCL 4 MG PO TABS
4.0000 mg | ORAL_TABLET | Freq: Four times a day (QID) | ORAL | Status: DC | PRN
  Administered 2017-11-29: 4 mg via ORAL
  Filled 2017-11-29: qty 1

## 2017-11-29 MED ORDER — ACETAMINOPHEN 325 MG PO TABS: 650.0000 mg | ORAL_TABLET | Freq: Four times a day (QID) | ORAL | Status: DC | PRN

## 2017-11-29 MED ORDER — LACTATED RINGERS IV SOLN
INTRAVENOUS | Status: AC
  Administered 2017-11-29 (×2): via INTRAVENOUS

## 2017-11-29 MED ORDER — HYDRALAZINE HCL 20 MG/ML IJ SOLN: 10.0000 mg | INTRAMUSCULAR | Status: DC | PRN

## 2017-11-29 MED ORDER — ONDANSETRON HCL 4 MG/2ML IJ SOLN
4.0000 mg | Freq: Once | INTRAMUSCULAR | Status: AC
  Administered 2017-11-29: 4 mg via INTRAVENOUS
  Filled 2017-11-29: qty 2

## 2017-11-29 MED ORDER — IOPAMIDOL (ISOVUE-300) INJECTION 61%
100.0000 mL | Freq: Once | INTRAVENOUS | Status: AC | PRN
  Administered 2017-11-29: 80 mL via INTRAVENOUS

## 2017-11-29 MED ORDER — MORPHINE SULFATE (PF) 4 MG/ML IV SOLN: 4.0000 mg | Freq: Once | INTRAVENOUS | Status: DC

## 2017-11-29 MED ORDER — SODIUM CHLORIDE 0.9 % IV BOLUS
1000.0000 mL | Freq: Once | INTRAVENOUS | Status: AC
  Administered 2017-11-29: 1000 mL via INTRAVENOUS

## 2017-11-29 MED ORDER — INSULIN ASPART 100 UNIT/ML ~~LOC~~ SOLN: 0.0000 [IU] | Freq: Every day | SUBCUTANEOUS | Status: DC

## 2017-11-29 MED ORDER — ONDANSETRON HCL 4 MG/2ML IJ SOLN: 4.0000 mg | Freq: Four times a day (QID) | INTRAMUSCULAR | Status: DC | PRN

## 2017-11-29 MED ORDER — INSULIN GLARGINE 100 UNIT/ML ~~LOC~~ SOLN
25.0000 [IU] | Freq: Two times a day (BID) | SUBCUTANEOUS | Status: DC
  Administered 2017-11-29 – 2017-11-30 (×2): 25 [IU] via SUBCUTANEOUS
  Filled 2017-11-29 (×4): qty 0.25

## 2017-11-29 NOTE — ED Notes (Signed)
Pt taken to MRI  

## 2017-11-29 NOTE — ED Provider Notes (Signed)
[facility] EMERGENCY DEPARTMENT Provider Note   CSN: [id number] Arrival date & time: 11/29/17  7096     History   Chief Complaint Chief Complaint  Patient presents with  . Nausea    HPI Jonathan Avery is a 84 y.o. male with history of diabetes on insulin, MI, hypertension, colonic polyps status post hemicolectomy, BPH, stomach ulcers, ventral hernia is here for evaluation of nausea and vomiting.  Onset yesterday morning, sudden, persistent.  Moderate.  States anytime he tries to eat/drink he will throw it back up.  Last time he ate or drank anything was Saturday evening.  No new exposures to suspicious foods.  No sick contacts.  No heavy ETOH or NSAID use. Associated with mild nonproductive cough x1 day, intermittent moderate abdominal pain that is around the bellybutton, nonradiating.  He is thirsty.  His sugars have been running in the 300-500 range.  Has noticed that his breathing is more labored when throwing up and moving around.  Also feels generally weak.  Gave insulin PTA. No alleviating or aggravating factors. No interventions PTA.    No fevers, chills, chest pain, dysuria, hematuria, flank pain, changes in bowel movements.  HPI  Past Medical History:  Diagnosis Date  . BPH (benign prostatic hypertrophy)   . Diabetes ([place])   . High cholesterol   . Hypertension   . Myocardial infarction [facility])     Patient Active Problem List   Diagnosis Date Noted  . Diabetes ([place]) 11/29/2017  . Essential hypertension 11/29/2017  . Dyslipidemia 11/29/2017  . Myocardial infarction ([place]) 11/29/2017  . BPH (benign prostatic hyperplasia) 11/29/2017  . SBO (small bowel obstruction) ([place]) 11/29/2017  . Hx of colonic polyps   . History of colonic polyps 10/10/2009    Past Surgical History:  Procedure Laterality Date  . CHOLECYSTECTOMY  1973  . COLONOSCOPY  07/20/2006   Dr. [name]:Status post right hemicolectomy. Residual colonic mucosa appeared normal, normal rectum.   . COLONOSCOPY   2006/2007   sprawling villous adenoma at ileocecal valve  . COLONOSCOPY  2011   Dr. [name]: normal rectum, pancolonic diverticulosis, 2 diminutive polyps, with path benign polypoid colonic mucosa  . COLONOSCOPY N/A 01/02/2015   Procedure: COLONOSCOPY;  Surgeon: Daneil Dolin, MD;  Location: [facility] ENDO SUITE;  Service: Endoscopy;  Laterality: N/A;  1245  . open hemicolectomy  2007   due to villous adenoma        Home Medications    Prior to Admission medications   Medication Sig Start Date End Date Taking? Authorizing Provider  amLODipine (NORVASC) 5 MG tablet Take 5 mg by mouth daily.  11/28/14   [provider]  aspirin 81 MG tablet Take 81 mg by mouth daily.    [provider]  glipiZIDE (GLUCOTROL XL) 5 MG 24 hr tablet Take 5 mg by mouth daily with breakfast.  11/05/14   [provider]  LANTUS SOLOSTAR 100 UNIT/ML Solostar Pen Inject 65 Units into the skin daily at 10 pm.  11/27/14   [provider]  losartan-hydrochlorothiazide (HYZAAR) 50-12.5 MG per tablet Take 1 tablet by mouth daily.  11/28/14   [provider]  metFORMIN (GLUCOPHAGE) 1000 MG tablet Take 1,000 mg by mouth 2 (two) times daily with a meal.  11/27/14   [provider]  polyethylene glycol-electrolytes (NULYTELY/GOLYTELY) 420 G solution Take 4,000 mLs by mouth once. 12/06/14   Annitta Needs, NP  simvastatin (ZOCOR) 20 MG tablet Take 20 mg by mouth daily at 6  PM.  11/27/14   [provider]  tamsulosin (FLOMAX) 0.4 MG CAPS capsule Take 0.4 mg by mouth daily.  11/05/14   [provider]    Family History Family History  Problem Relation Age of Onset  . Ulcers Father   . Diabetes type II Brother   . Diabetes type II Brother   . Colon cancer Neg Hx     Social History Social History   Tobacco Use  . Smoking status: Former Smoker    Years: 10.00    Types: Cigars  . Smokeless tobacco: Never Used  Substance Use Topics  . Alcohol use: No     Alcohol/week: 0.0 oz  . Drug use: No     Allergies   Patient has no known allergies.   Review of Systems Review of Systems  Constitutional: Positive for appetite change.  Respiratory: Positive for cough and shortness of breath.   Gastrointestinal: Positive for abdominal pain, nausea and vomiting.  Neurological: Positive for weakness (generalized).  All other systems reviewed and are negative.    Physical Exam Updated Vital Signs BP (!) 148/76   Pulse 67   Temp (!) 97.5 F (36.4 C) (Oral)   Resp (!) 33   Ht _0  (1.753 m)   Wt 102.1 kg (225 lb)   SpO2 92%   BMI 33.23 kg/m   Physical Exam  Constitutional: He is oriented to person, place, and time. He appears well-developed and well-nourished. No distress.  Non toxic.  HENT:  Head: Normocephalic and atraumatic.  Nose: Nose normal.  Mouth/Throat: Mucous membranes are dry.  Dry lips and mucous membranes   Eyes: Pupils are equal, round, and reactive to light. Conjunctivae and EOM are normal.  Neck: Normal range of motion.  Cardiovascular: Normal rate, regular rhythm, normal heart sounds and intact distal pulses.  No murmur heard. 2+ DP and radial pulses bilaterally. No LE edema.   Pulmonary/Chest: Effort normal. He has decreased breath sounds in the right lower field and the left lower field.  Large abdomen makes exam difficult, decreased breath sounds to lower lobes anteriorly, clear posteriorly. SpO2 87-93% at rest RA. Normal WOB.   Abdominal: Soft. Bowel sounds are normal. There is tenderness.  Mild periumbilical and epigastric tenderness. Obese abdomen, mild distention. No G/R/R. No suprapubic or CVA tenderness. Negative Murphy's and McBurney's. Multiple well healed surgical scars noted. Reducible ventral hernia without overlaying skin changes. No bowel sounds.   Musculoskeletal: Normal range of motion.  Neurological: He is alert and oriented to person, place, and time.  Skin: Skin is warm and dry. Capillary refill  takes less than 2 seconds.  Psychiatric: He has a normal mood and affect. His behavior is normal. Judgment and thought content normal.  Nursing note and vitals reviewed.    ED Treatments / Results  Labs (all labs ordered are listed, but only abnormal results are displayed) Labs Reviewed  COMPREHENSIVE METABOLIC PANEL - Abnormal; Notable for the following components:      Result Value   Glucose, Bld 188 (*)    BUN 40 (*)    Creatinine, Ser 1.47 (*)    GFR calc non Af Amer 42 (*)    GFR calc Af Amer 49 (*)    All other components within normal limits  CBC WITH DIFFERENTIAL/PLATELET - Abnormal; Notable for the following components:   WBC 14.0 (*)    Neutro Abs 11.3 (*)    Monocytes Absolute 1.1 (*)    All other components within normal  limits  URINALYSIS, ROUTINE W REFLEX MICROSCOPIC - Abnormal; Notable for the following components:   Protein, ur 100 (*)    Bacteria, UA RARE (*)    All other components within normal limits  I-STAT CG4 LACTIC ACID, ED - Abnormal; Notable for the following components:   Lactic Acid, Venous 2.66 (*)    All other components within normal limits  CBG MONITORING, ED - Abnormal; Notable for the following components:   Glucose-Capillary 158 (*)    All other components within normal limits  URINE CULTURE  LIPASE, BLOOD  I-STAT TROPONIN, ED  I-STAT CG4 LACTIC ACID, ED    EKG None  Radiology Dg Chest 2 View  Result Date: 11/29/2017 CLINICAL DATA:  84 year old with hyperglycemia over the past several days. Two-day history of cough, nausea/vomiting and generalized weakness. Diminished breath sounds to the lung bases on physical examination. EXAM: CHEST - 2 VIEW COMPARISON:  05/06/2015, 02/07/2008.  CTA chest 02/07/2008. FINDINGS: Cardiac silhouette upper normal in size, unchanged. Thoracic aorta mildly tortuous and atherosclerotic, unchanged. Hilar and mediastinal contours otherwise unremarkable. Minimal linear scarring in the lingula, unchanged. Lungs  otherwise clear. Bronchovascular markings normal. Pulmonary vascularity normal. No visible pleural effusions. No pneumothorax. Degenerative changes involving the thoracic spine. IMPRESSION: No acute cardiopulmonary disease. Electronically Signed   By: Evangeline Dakin M.D.   On: 11/29/2017 10:29   Ct Abdomen Pelvis W Contrast  Result Date: 11/29/2017 CLINICAL DATA:  Lower abdominal pain with nausea. EXAM: CT ABDOMEN AND PELVIS WITH CONTRAST TECHNIQUE: Multidetector CT imaging of the abdomen and pelvis was performed using the standard protocol following bolus administration of intravenous contrast. CONTRAST:  52m ISOVUE-300 IOPAMIDOL (ISOVUE-300) INJECTION 61% COMPARISON:  None FINDINGS: Lower chest: No acute abnormality. Atherosclerotic calcifications noted. Calcifications in the RCA coronary artery also identified. Hepatobiliary: No focal liver abnormality is seen. Status post cholecystectomy. No biliary dilatation. Pancreas: There is an indeterminate solid-appearing lesion involving the distal tail of pancreas which appears new from previous exam measuring 2 cm, image 27/2 and image 75/6. Spleen: The spleen appears normal. Adrenals/Urinary Tract: The adrenal glands appear normal. Bilateral kidney cysts noted including a 2.5 cm interpolar left kidney cyst. No mass or hydronephrosis. Mild diffuse bladder wall thickening identified.Calcification in the left posterior bladder base is noted measuring 5 mm. Stomach/Bowel: The stomach is normal. There is increase caliber of the mid small bowel loops which measure up to 3.3 cm, image 62/2. Transition point appears to be within a small ventral abdominal wall hernia, slightly right of the midline which contains a knuckle of small bowel, image 65/2. Status post right hemicolectomy. Scattered colonic diverticula noted without acute inflammation. Vascular/Lymphatic: Aortic atherosclerosis. No aneurysm. No enlarged abdominal or pelvic lymph nodes. Reproductive: Prostate  gland appears enlarged measuring 6 by 6.2 by 6.2 cm (volume = 100 cm^3). Other: No free fluid or fluid collections. No pneumoperitoneum identified. Musculoskeletal: Spondylosis identified within the thoracic and lumbar spine. No aggressive lytic or sclerotic bone lesions. IMPRESSION: 1. Exam positive for small bowel obstruction. The transition point is within a ventral abdominal wall hernia containing a knuckle of small bowel which is slightly right of the midline. 2. There is an indeterminate, solid-appearing lesion arising from the tail of pancreas. A small pancreatic neoplasm cannot be excluded. Further investigation with nonemergent, contrast enhanced, pancreas protocol MRI is advised. 3. Prostate gland enlargement. 4.  Aortic Atherosclerosis (ICD10-I70.0). Electronically Signed   By: TKerby MoorsM.D.   On: 11/29/2017 12:14    Procedures Procedures (including critical care  time)  Medications Ordered in ED Medications  morphine 4 MG/ML injection 4 mg (has no administration in time range)  ondansetron (ZOFRAN) injection 4 mg (4 mg Intravenous Given 11/29/17 1001)  sodium chloride 0.9 % bolus 1,000 mL (0 mLs Intravenous Stopped 11/29/17 1102)  iopamidol (ISOVUE-300) 61 % injection 100 mL (80 mLs Intravenous Contrast Given 11/29/17 1140)     Initial Impression / Assessment and Plan / ED Course  I have reviewed the triage vital signs and the nursing notes.  Pertinent labs & imaging results that were available during my care of the patient were reviewed by me and considered in my medical decision making (see chart for details).  Clinical Course as of Nov 29 1304  Mon Nov 29, 2017  1002 WBC(!): 14.0 [CG]  1002 Lactic Acid, Venous(!!): 2.66 [CG]  1031 Creatinine(!): 1.47 [CG]  1031 BUN(!): 40 [CG]  1031 GFR, Est Non African American(!): 42 [CG]  1223 IMPRESSION: 1. Exam positive for small bowel obstruction. The transition point is within a ventral abdominal wall hernia containing a knuckle  of small bowel which is slightly right of the midline. 2. There is an indeterminate, solid-appearing lesion arising from the tail of pancreas. A small pancreatic neoplasm cannot be excluded. Further investigation with nonemergent, contrast enhanced, pancreas protocol MRI is advised. 3. Prostate gland enlargement. 4. Aortic Atherosclerosis (ICD10-I70.0).    CT ABDOMEN PELVIS W CONTRAST [CG]    Clinical Course User Index [CG] Kinnie Feil, PA-C    84 yo M here with nausea, vomiting, abdominal pain, mild cough. In setting of stomach ulcers, ventral hernia, s/p hemicolectomy and cholecystectomy. No fevers. No SIRS criteria met.   Ddx includes viral syndrome leading to dehydration vs GERD/PUD flare vs obstruction vs hernia complication. Also considering diabetic complications. No h/o pancreatitis.  Appendicitis lower on differential given abd exam. Will obtain screening labs.  CXR, EKG, trop for cough/weakness, h/o MI and risk although low suspicion for ACS.   Final Clinical Impressions(s) / ED Diagnoses   1230: CT confirms SBO with transition point at ventral abdominal hernia. Discussed results with pt and family. New, solid appearing lesion on pancreas (neoplasm cannot be excluded), this was not discussed with pt at this time.  Pt is feeling better however given age, risk, leukocytosis, elevated lactic acid and creatinine, recommending admission for observation to ensure no clinical decline from SBO. Pt agreeable. Pt shared with Dr Gilford Raid.   1300: Spoke to Dr Brigitte Pulse (hospitalist) who recommends general surgery consult. He will admit patient.   1305: Spoke to Dr Arnoldo Morale (surgery) who is aware of patient, does not expect immediate interventions, will see patient.  Final diagnoses:  Small bowel obstruction ([place])  Lesion of pancreas    ED Discharge Orders    None       Arlean Hopping 11/29/17 1306    Isla Pence, MD 11/29/17 1321

## 2017-11-29 NOTE — ED Notes (Signed)
Pt taken to ct 

## 2017-11-29 NOTE — ED Triage Notes (Signed)
PT c/o nausea with vomiting x2 days and CBG reading higher than his normal over the past few days. PT denies any abdominal pain and had normal BM today.

## 2017-11-29 NOTE — Progress Notes (Signed)
Dr. Arnoldo Morale notified of general surgery consult.  Dr. Arnoldo Morale stated he will see the patient tomorrow.

## 2017-11-29 NOTE — H&P (Signed)
History and Physical    ASBURY HAIR ATF:[id number] DOB: 08-10-1930 DOA: 11/29/2017  PCP: Asencion Noble, MD   Patient coming from: Home  Chief Complaint: Nausea and vomiting  HPI: Jonathan Avery is a 84 y.o. male with medical history significant for hypertension, type 2 diabetes, dyslipidemia, BPH, prior open hemicolectomy, and prior myocardial infarction who presented to the emergency department with worsening nausea, vomiting, and dry heaves that began early yesterday morning.  He states that anytime he tries to eat or drink, that he will immediately throw it back up.  He denies any new exposures or suspicious foods and has no sick contacts.  He denies any hematemesis, diarrhea, or dark stools.  He states that he typically has 3 bowel movements a day and that his last one was earlier this morning.  He is currently passing flatus.  He denies any current nausea or vomiting.  Of note, his blood glucose has been running in 300-562m/dL range, but appears to be better controlled in the ED today.   ED Course: Vital signs are stable with mild leukocytosis of 14,000.  Creatinine is 1.47 which is at his usual baseline for CKD stage III.  Glucose is currently 188.  Lactic acid is 2.66.  CT of the abdomen pelvis demonstrates signs of small bowel obstruction with bowel loop and ventral abdominal hernia.  There also appears to be a pancreatic mass present on CT scan with further recommendations for abdominal MRI at this time.  Chest x-ray with no acute findings.  Review of Systems: All others reviewed and otherwise negative.  Past Medical History:  Diagnosis Date  . BPH (benign prostatic hypertrophy)   . Diabetes ([place]   . High cholesterol   . Hypertension   . Myocardial infarction ([facility]     Past Surgical History:  Procedure Laterality Date  . CHOLECYSTECTOMY  1973  . COLONOSCOPY  07/20/2006   Dr. [name]:Status post right hemicolectomy. Residual colonic mucosa appeared normal, normal rectum.   .  COLONOSCOPY  2006/2007   sprawling villous adenoma at ileocecal valve  . COLONOSCOPY  2011   Dr. RGala Romney normal rectum, pancolonic diverticulosis, 2 diminutive polyps, with path benign polypoid colonic mucosa  . COLONOSCOPY N/A 01/02/2015   Procedure: COLONOSCOPY;  Surgeon: RDaneil Dolin MD;  Location: [facility];  Service: Endoscopy;  Laterality: N/A;  1245  . open hemicolectomy  2007   due to villous adenoma     reports that he has quit smoking. His smoking use included cigars. He quit after 10.00 years of use. He has never used smokeless tobacco. He reports that he does not drink alcohol or use drugs.  No Known Allergies  Family History  Problem Relation Age of Onset  . Ulcers Father   . Diabetes type II Brother   . Diabetes type II Brother   . Colon cancer Neg Hx     Prior to Admission medications   Medication Sig Start Date End Date Taking? Authorizing Provider  amLODipine (NORVASC) 5 MG tablet Take 5 mg by mouth daily.  11/28/14   [provider]  aspirin 81 MG tablet Take 81 mg by mouth daily.    [provider]  glipiZIDE (GLUCOTROL XL) 5 MG 24 hr tablet Take 5 mg by mouth daily with breakfast.  11/05/14   [provider]  LANTUS SOLOSTAR 100 UNIT/ML Solostar Pen Inject 65 Units into the skin daily at 10 pm.  11/27/14   [provider]  losartan-hydrochlorothiazide (HYZAAR) 50-12.5  MG per tablet Take 1 tablet by mouth daily.  11/28/14   [provider]  metFORMIN (GLUCOPHAGE) 1000 MG tablet Take 1,000 mg by mouth 2 (two) times daily with a meal.  11/27/14   [provider]  polyethylene glycol-electrolytes (NULYTELY/GOLYTELY) 420 G solution Take 4,000 mLs by mouth once. 12/06/14   Annitta Needs, NP  simvastatin (ZOCOR) 20 MG tablet Take 20 mg by mouth daily at 6 PM.  11/27/14   [provider]  tamsulosin (FLOMAX) 0.4 MG CAPS capsule Take 0.4 mg by mouth daily.  11/05/14   [provider]    Physical  Exam: Vitals:   11/29/17 1100 11/29/17 1130 11/29/17 1200 11/29/17 1230  BP: (!) 169/70 (!) 146/67 (!) 149/72 (!) 148/76  Pulse: 65 65 62 67  Resp: 15  (!) 27 (!) 33  Temp:      TempSrc:      SpO2: 91% 97% 96% 92%  Weight:      Height:        Constitutional: NAD, calm, comfortable Vitals:   11/29/17 1100 11/29/17 1130 11/29/17 1200 11/29/17 1230  BP: (!) 169/70 (!) 146/67 (!) 149/72 (!) 148/76  Pulse: 65 65 62 67  Resp: 15  (!) 27 (!) 33  Temp:      TempSrc:      SpO2: 91% 97% 96% 92%  Weight:      Height:       Eyes: lids and conjunctivae normal ENMT: Mucous membranes are moist.  Neck: normal, supple Respiratory: clear to auscultation bilaterally. Normal respiratory effort. No accessory muscle use.  Cardiovascular: Regular rate and rhythm, no murmurs. No extremity edema. Abdomen: no tenderness, no distention. Bowel sounds positive, but hypoactive.  Mild reducible hernia that is periumbilical.  Nontender to palpation. Musculoskeletal:  No joint deformity upper and lower extremities.   Skin: no rashes, lesions, ulcers.  Psychiatric: Normal judgment and insight. Alert and oriented x 3. Normal mood.   Labs on Admission: I have personally reviewed following labs and imaging studies  CBC: Recent Labs  Lab 11/29/17 0943  WBC 14.0*  NEUTROABS 11.3*  HGB 15.7  HCT 45.0  MCV 88.9  PLT 811   Basic Metabolic Panel: Recent Labs  Lab 11/29/17 0943  NA 138  K 4.2  CL 103  CO2 24  GLUCOSE 188*  BUN 40*  CREATININE 1.47*  CALCIUM 9.5   GFR: Estimated Creatinine Clearance: 44.1 mL/min (A) (by C-G formula based on SCr of 1.47 mg/dL (H)). Liver Function Tests: Recent Labs  Lab 11/29/17 0943  AST 25  ALT 19  ALKPHOS 49  BILITOT 1.0  PROT 6.9  ALBUMIN 3.8   Recent Labs  Lab 11/29/17 0943  LIPASE 28   No results for input(s): AMMONIA in the last 168 hours. Coagulation Profile: No results for input(s): INR, PROTIME in the last 168 hours. Cardiac Enzymes: No  results for input(s): CKTOTAL, CKMB, CKMBINDEX, TROPONINI in the last 168 hours. BNP (last 3 results) No results for input(s): PROBNP in the last 8760 hours. HbA1C: No results for input(s): HGBA1C in the last 72 hours. CBG: Recent Labs  Lab 11/29/17 1009  GLUCAP 158*   Lipid Profile: No results for input(s): CHOL, HDL, LDLCALC, TRIG, CHOLHDL, LDLDIRECT in the last 72 hours. Thyroid Function Tests: No results for input(s): TSH, T4TOTAL, FREET4, T3FREE, THYROIDAB in the last 72 hours. Anemia Panel: No results for input(s): VITAMINB12, FOLATE, FERRITIN, TIBC, IRON, RETICCTPCT in the last 72 hours. Urine analysis:  Component Value Date/Time   COLORURINE YELLOW 11/29/2017 0928   APPEARANCEUR CLEAR 11/29/2017 0928   LABSPEC 1.017 11/29/2017 0928   PHURINE 5.0 11/29/2017 0928   GLUCOSEU NEGATIVE 11/29/2017 0928   HGBUR NEGATIVE 11/29/2017 0928   BILIRUBINUR NEGATIVE 11/29/2017 0928   KETONESUR NEGATIVE 11/29/2017 0928   PROTEINUR 100 (A) 11/29/2017 0928   NITRITE NEGATIVE 11/29/2017 0928   LEUKOCYTESUR NEGATIVE 11/29/2017 0928    Radiological Exams on Admission: Dg Chest 2 View  Result Date: 11/29/2017 CLINICAL DATA:  84 year old with hyperglycemia over the past several days. Two-day history of cough, nausea/vomiting and generalized weakness. Diminished breath sounds to the lung bases on physical examination. EXAM: CHEST - 2 VIEW COMPARISON:  05/06/2015, 02/07/2008.  CTA chest 02/07/2008. FINDINGS: Cardiac silhouette upper normal in size, unchanged. Thoracic aorta mildly tortuous and atherosclerotic, unchanged. Hilar and mediastinal contours otherwise unremarkable. Minimal linear scarring in the lingula, unchanged. Lungs otherwise clear. Bronchovascular markings normal. Pulmonary vascularity normal. No visible pleural effusions. No pneumothorax. Degenerative changes involving the thoracic spine. IMPRESSION: No acute cardiopulmonary disease. Electronically Signed   By: Evangeline Dakin  M.D.   On: 11/29/2017 10:29   Ct Abdomen Pelvis W Contrast  Result Date: 11/29/2017 CLINICAL DATA:  Lower abdominal pain with nausea. EXAM: CT ABDOMEN AND PELVIS WITH CONTRAST TECHNIQUE: Multidetector CT imaging of the abdomen and pelvis was performed using the standard protocol following bolus administration of intravenous contrast. CONTRAST:  71m ISOVUE-300 IOPAMIDOL (ISOVUE-300) INJECTION 61% COMPARISON:  None FINDINGS: Lower chest: No acute abnormality. Atherosclerotic calcifications noted. Calcifications in the RCA coronary artery also identified. Hepatobiliary: No focal liver abnormality is seen. Status post cholecystectomy. No biliary dilatation. Pancreas: There is an indeterminate solid-appearing lesion involving the distal tail of pancreas which appears new from previous exam measuring 2 cm, image 27/2 and image 75/6. Spleen: The spleen appears normal. Adrenals/Urinary Tract: The adrenal glands appear normal. Bilateral kidney cysts noted including a 2.5 cm interpolar left kidney cyst. No mass or hydronephrosis. Mild diffuse bladder wall thickening identified.Calcification in the left posterior bladder base is noted measuring 5 mm. Stomach/Bowel: The stomach is normal. There is increase caliber of the mid small bowel loops which measure up to 3.3 cm, image 62/2. Transition point appears to be within a small ventral abdominal wall hernia, slightly right of the midline which contains a knuckle of small bowel, image 65/2. Status post right hemicolectomy. Scattered colonic diverticula noted without acute inflammation. Vascular/Lymphatic: Aortic atherosclerosis. No aneurysm. No enlarged abdominal or pelvic lymph nodes. Reproductive: Prostate gland appears enlarged measuring 6 by 6.2 by 6.2 cm (volume = 100 cm^3). Other: No free fluid or fluid collections. No pneumoperitoneum identified. Musculoskeletal: Spondylosis identified within the thoracic and lumbar spine. No aggressive lytic or sclerotic bone lesions.  IMPRESSION: 1. Exam positive for small bowel obstruction. The transition point is within a ventral abdominal wall hernia containing a knuckle of small bowel which is slightly right of the midline. 2. There is an indeterminate, solid-appearing lesion arising from the tail of pancreas. A small pancreatic neoplasm cannot be excluded. Further investigation with nonemergent, contrast enhanced, pancreas protocol MRI is advised. 3. Prostate gland enlargement. 4.  Aortic Atherosclerosis (ICD10-I70.0). Electronically Signed   By: TKerby MoorsM.D.   On: 11/29/2017 12:14    Assessment/Plan Principal Problem:   SBO (small bowel obstruction) (HCC) Active Problems:   Diabetes ([place]   Essential hypertension   Dyslipidemia   Myocardial infarction (HCC)   BPH (benign prostatic hyperplasia)   CKD (chronic kidney disease), stage  III ([place])    1. Small bowel obstruction in the setting of ventral abdominal wall hernia.  Trial of clear liquid diet for supper with IV fluid.  Zofran as needed for nausea and vomiting.  This appears to be more of a partial small bowel obstruction as he had a bowel movement earlier today and is passing flatus.  Appreciate general surgery evaluation.  No need for NG tube at this time. 2. Pancreatic mass.  Further evaluation with abdominal MRI with contrast enhancement.  We will also order serum studies with CA19-9. 3. Insulin-dependent diabetes.  Hold home glipizide and metformin and maintain on half dose of Lantus with sliding scale insulin. 4. Hypertension.  Hold home medications with hydralazine pushes every 4 hours as needed for blood pressure elevations. 5. Dyslipidemia.  Hold statin. 6. CKD stage III.  Appears to be at baseline.  Repeat renal panel in a.m. 7. BPH.  Hold tamsulosin until diet can be tolerated.   DVT prophylaxis: Lovenox Code Status: Full Family Communication: Son at bedside Disposition Plan: Gradual diet advancement with conservative measures and general  surgery evaluation Consults called: General surgery Admission status: Observation, MedSurg   Linnette Panella Darleen Crocker DO [facility] Pager [phone]  If 7PM-7AM, please contact night-coverage [email or web address] Password [email or web address]  11/29/2017, 1:22 PM

## 2017-11-29 NOTE — ED Notes (Signed)
Pt denies any pain at present so will hold morphine

## 2017-11-29 NOTE — ED Notes (Signed)
Pt taken to xray 

## 2017-11-30 ENCOUNTER — Telehealth: Payer: Self-pay | Admitting: Gastroenterology

## 2017-11-30 DIAGNOSIS — K56609 Unspecified intestinal obstruction, unspecified as to partial versus complete obstruction: Secondary | ICD-10-CM | POA: Diagnosis not present

## 2017-11-30 DIAGNOSIS — N183 Chronic kidney disease, stage 3 (moderate): Secondary | ICD-10-CM | POA: Diagnosis not present

## 2017-11-30 DIAGNOSIS — E1122 Type 2 diabetes mellitus with diabetic chronic kidney disease: Secondary | ICD-10-CM | POA: Diagnosis not present

## 2017-11-30 DIAGNOSIS — K8689 Other specified diseases of pancreas: Secondary | ICD-10-CM

## 2017-11-30 LAB — COMPREHENSIVE METABOLIC PANEL
ALK PHOS: 48 U/L (ref 38–126)
ALT: 17 U/L (ref 0–44)
AST: 18 U/L (ref 15–41)
Albumin: 3.3 g/dL — ABNORMAL LOW (ref 3.5–5.0)
Anion gap: 8 (ref 5–15)
BUN: 27 mg/dL — ABNORMAL HIGH (ref 8–23)
CALCIUM: 8.5 mg/dL — AB (ref 8.9–10.3)
CO2: 24 mmol/L (ref 22–32)
Chloride: 107 mmol/L (ref 98–111)
Creatinine, Ser: 1.19 mg/dL (ref 0.61–1.24)
GFR calc non Af Amer: 54 mL/min — ABNORMAL LOW (ref >60)
GLUCOSE: 113 mg/dL — AB (ref 70–99)
Potassium: 4.2 mmol/L (ref 3.5–5.1)
SODIUM: 139 mmol/L (ref 135–145)
TOTAL PROTEIN: 5.9 g/dL — AB (ref 6.5–8.1)
Total Bilirubin: 0.9 mg/dL (ref 0.3–1.2)

## 2017-11-30 LAB — GLUCOSE, CAPILLARY
GLUCOSE-CAPILLARY: 108 mg/dL — AB (ref 70–99)
Glucose-Capillary: 96 mg/dL (ref 70–99)
Glucose-Capillary: 170 mg/dL — ABNORMAL HIGH (ref 70–99)

## 2017-11-30 LAB — CBC
HEMATOCRIT: 41.6 % (ref 39.0–52.0)
Hemoglobin: 13.8 g/dL (ref 13.0–17.0)
MCH: 30.2 pg (ref 26.0–34.0)
MCHC: 33.2 g/dL (ref 30.0–36.0)
MCV: 91 fL (ref 78.0–100.0)
Platelets: 153 10*3/uL (ref 150–400)
RBC: 4.57 MIL/uL (ref 4.22–5.81)
RDW: 13.6 % (ref 11.5–15.5)
WBC: 7.4 10*3/uL (ref 4.0–10.5)

## 2017-11-30 LAB — URINE CULTURE: Culture: <10000 — AB

## 2017-11-30 LAB — CANCER ANTIGEN 19-9: CA 19-9: 22 U/mL (ref 0–35)

## 2017-11-30 MED ORDER — ONDANSETRON HCL 4 MG PO TABS: 4.0000 mg | ORAL_TABLET | Freq: Four times a day (QID) | ORAL | 0 refills | Status: DC | PRN

## 2017-11-30 NOTE — Progress Notes (Signed)
Discharge instructions reviewed with patient. Daughter at bedside. Given copy of AVS, prescription sent to his pharmacy by MD. Pt aware to pick up prescription, verbalized understanding of instructions and to call PCP for follow-up appointment. GI follow-up to be arranged with Dr. Ardis Hughs by Dr. Roseanne Kaufman office per Roseanne Kaufman, NP note. Patient and daughter verbalized understanding and state will call office if they have questions, concerns or have not received call. IV site d/c'd, site within normal limits. Pt left floor in stable condition via w/c accompanied by nurse tech. Discharged home. Jonathan Foil, RN

## 2017-11-30 NOTE — Discharge Summary (Signed)
Physician Discharge Summary  MITCHEL DELDUCA HQI:[id number] DOB: 10-17-1930 DOA: 11/29/2017  PCP: Asencion Noble, MD  Admit date: 11/29/2017  Discharge date: 11/30/2017  Admitted From:Home  Disposition:  Home  Recommendations for Outpatient Follow-up:  1. Follow up with PCP in 1-2 weeks 2. Follow up with Dr. Ardis Hughs in [facility] for EUS with FNA as appropriate. This will be arranged by GI service (please see note by Roseanne Kaufman, NP)  Home Health:N/A  Equipment/Devices:N/A  Discharge Condition:Stable  CODE STATUS: Full  Diet recommendation: Heart Healthy  Brief/Interim Summary:  Jonathan Avery is a 84 y.o. male with medical history significant for hypertension, type 2 diabetes, dyslipidemia, BPH, prior open hemicolectomy, and prior myocardial infarction who presented to the emergency department with worsening nausea, vomiting, and dry heaves that began early yesterday morning. He was seen to have a small bowel obstruction on CT near his ventral abdominal wall hernia. This had spontaneously improved with conservative management. He was seen by general surgery with no recommendations for operative intervention at this time. He was also noted to have a pancreatic tail mass which will need further evaluation with GI for possible EUS with FNA.   Discharge Diagnoses:  Principal Problem:   SBO (small bowel obstruction) (HCC) Active Problems:   Diabetes ([place])   Essential hypertension   Dyslipidemia   Myocardial infarction (HCC)   BPH (benign prostatic hyperplasia)   CKD (chronic kidney disease), stage III ([place])   1. Small bowel obstruction in the setting of ventral abdominal wall hernia-resolved.  Tolerating diet; no further symptoms. No plans for intervention per GS. 2. Pancreatic mass.  Will need possible EUS with FNA in outpatient setting. To be referred to Dr. Ardis Hughs in [place] for this by GI. 3. Insulin-dependent diabetes.  Continue home medications. 4. Hypertension.  Continue home  medications. 5. Dyslipidemia.  Continue statin. 6. CKD stage III.  Appears to be at baseline.  Repeat renal panel in a.m. 7. BPH.  Continue tamsulosin.    Discharge Instructions  Discharge Instructions    Diet - low sodium heart healthy   Complete by:  As directed    Increase activity slowly   Complete by:  As directed      Allergies as of 11/30/2017   No Known Allergies     Medication List    TAKE these medications   Alpha-Lipoic Acid 600 MG Caps Take 1 capsule by mouth 2 (two) times daily.   amLODipine 5 MG tablet Commonly known as:  NORVASC Take 5 mg by mouth daily.   aspirin 81 MG tablet Take 81 mg by mouth daily.   Cinnamon 500 MG capsule Take 1,000 mg by mouth daily.   hydrALAZINE 25 MG tablet Commonly known as:  APRESOLINE Take 25 mg by mouth 2 (two) times daily.   hydrochlorothiazide 25 MG tablet Commonly known as:  HYDRODIURIL Take 25 mg by mouth daily.   insulin aspart 100 UNIT/ML FlexPen Commonly known as:  NOVOLOG Inject 15 Units into the skin 2 (two) times daily.   LANTUS SOLOSTAR 100 UNIT/ML Solostar Pen Generic drug:  Insulin Glargine Inject 45 Units into the skin 2 (two) times daily.   losartan 100 MG tablet Commonly known as:  COZAAR Take 100 mg by mouth daily.   metFORMIN 500 MG tablet Commonly known as:  GLUCOPHAGE Take 500 mg by mouth 2 (two) times daily with a meal.   metoprolol tartrate 100 MG tablet Commonly known as:  LOPRESSOR Take 100 mg by mouth 2 (two) times  daily.   ondansetron 4 MG tablet Commonly known as:  ZOFRAN Take 1 tablet (4 mg total) by mouth every 6 (six) hours as needed for nausea.   simvastatin 20 MG tablet Commonly known as:  ZOCOR Take 20 mg by mouth daily at 6 PM.   tamsulosin 0.4 MG Caps capsule Commonly known as:  FLOMAX Take 0.4 mg by mouth every evening.      Follow-up Information    Asencion Noble, MD Follow up in 1 week(s).   Specialty:  Internal Medicine Contact information: [address] [place] [address] [phone]          No Known Allergies  Consultations:  GI  General Surgery   Procedures/Studies: Dg Chest 2 View  Result Date: 11/29/2017 CLINICAL DATA:  84 year old with hyperglycemia over the past several days. Two-day history of cough, nausea/vomiting and generalized weakness. Diminished breath sounds to the lung bases on physical examination. EXAM: CHEST - 2 VIEW COMPARISON:  05/06/2015, 02/07/2008.  CTA chest 02/07/2008. FINDINGS: Cardiac silhouette upper normal in size, unchanged. Thoracic aorta mildly tortuous and atherosclerotic, unchanged. Hilar and mediastinal contours otherwise unremarkable. Minimal linear scarring in the lingula, unchanged. Lungs otherwise clear. Bronchovascular markings normal. Pulmonary vascularity normal. No visible pleural effusions. No pneumothorax. Degenerative changes involving the thoracic spine. IMPRESSION: No acute cardiopulmonary disease. Electronically Signed   By: Evangeline Dakin M.D.   On: 11/29/2017 10:29   Mr Abdomen W Wo Contrast  Result Date: 11/29/2017 CLINICAL DATA:  Indeterminate pancreatic tail lesion on CT study from earlier today. 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 EXAM: MRI ABDOMEN WITHOUT AND WITH CONTRAST TECHNIQUE: Multiplanar multisequence MR imaging of the abdomen was performed both before and after the administration of intravenous contrast. CONTRAST:  55m MULTIHANCE GADOBENATE DIMEGLUMINE 529 MG/ML IV SOLN COMPARISON:  11/29/2017 CT abdomen/pelvis. FINDINGS: Lower chest: No acute abnormality at the lung bases. Hepatobiliary: Normal liver size and configuration. No hepatic steatosis. A few scattered tiny subcentimeter simple liver cysts. No suspicious liver masses. Cholecystectomy. Bile ducts are within normal post cholecystectomy limits with no intrahepatic biliary ductal dilatation. Common bile duct diameter 7 mm. No choledocholithiasis. Pancreas: There is a 2.0 x 1.9 cm heterogeneous T2  hyperintense pancreatic tail mass (series 4/image 22), which demonstrates heterogeneous low level internal enhancement. No additional pancreatic lesions. No pancreatic duct dilation. No pancreas divisum. Spleen: Normal size. No mass. Adrenals/Urinary Tract: Normal adrenals. No hydronephrosis. Several simple bilateral renal cysts, largest 2.6 cm in the interpolar left kidney. No suspicious renal masses. Stomach/Bowel: Normal non-distended stomach. Mildly dilated visualized anterior abdominal small bowel loops as seen on CT study from earlier today. No bowel wall thickening. Moderate left colonic diverticulosis. Vascular/Lymphatic: Atherosclerotic nonaneurysmal abdominal aorta. Patent portal, splenic, hepatic and renal veins. No pathologically enlarged lymph nodes in the abdomen. Other: No abdominal ascites or focal fluid collection. Musculoskeletal: No aggressive appearing focal osseous lesions. IMPRESSION: 1. Heterogeneous T2 hyperintense 2.0 cm pancreatic tail mass with heterogeneous low level internal enhancement, a high risk feature, for which pancreatic malignancy such as mucinous carcinoma cannot be excluded. GI consultation for endoscopic ultrasound correlation with FNA advised. 2. No findings of metastatic disease in the abdomen. 3. Mildly dilated small bowel loops in the anterior abdomen, better seen on the CT study from earlier today, which described small-bowel obstruction due to a right ventral abdominal hernia. 4. Moderate left colonic diverticulosis. 5.  Aortic Atherosclerosis (ICD10-I70.0). These results will be called to the ordering clinician or representative by the Radiology Department at the imaging  location. Electronically Signed   By: Ilona Sorrel M.D.   On: 11/29/2017 15:25   Ct Abdomen Pelvis W Contrast  Result Date: 11/29/2017 CLINICAL DATA:  Lower abdominal pain with nausea. EXAM: CT ABDOMEN AND PELVIS WITH CONTRAST TECHNIQUE: Multidetector CT imaging of the abdomen and pelvis was  performed using the standard protocol following bolus administration of intravenous contrast. CONTRAST:  77m ISOVUE-300 IOPAMIDOL (ISOVUE-300) INJECTION 61% COMPARISON:  None FINDINGS: Lower chest: No acute abnormality. Atherosclerotic calcifications noted. Calcifications in the RCA coronary artery also identified. Hepatobiliary: No focal liver abnormality is seen. Status post cholecystectomy. No biliary dilatation. Pancreas: There is an indeterminate solid-appearing lesion involving the distal tail of pancreas which appears new from previous exam measuring 2 cm, image 27/2 and image 75/6. Spleen: The spleen appears normal. Adrenals/Urinary Tract: The adrenal glands appear normal. Bilateral kidney cysts noted including a 2.5 cm interpolar left kidney cyst. No mass or hydronephrosis. Mild diffuse bladder wall thickening identified.Calcification in the left posterior bladder base is noted measuring 5 mm. Stomach/Bowel: The stomach is normal. There is increase caliber of the mid small bowel loops which measure up to 3.3 cm, image 62/2. Transition point appears to be within a small ventral abdominal wall hernia, slightly right of the midline which contains a knuckle of small bowel, image 65/2. Status post right hemicolectomy. Scattered colonic diverticula noted without acute inflammation. Vascular/Lymphatic: Aortic atherosclerosis. No aneurysm. No enlarged abdominal or pelvic lymph nodes. Reproductive: Prostate gland appears enlarged measuring 6 by 6.2 by 6.2 cm (volume = 100 cm^3). Other: No free fluid or fluid collections. No pneumoperitoneum identified. Musculoskeletal: Spondylosis identified within the thoracic and lumbar spine. No aggressive lytic or sclerotic bone lesions. IMPRESSION: 1. Exam positive for small bowel obstruction. The transition point is within a ventral abdominal wall hernia containing a knuckle of small bowel which is slightly right of the midline. 2. There is an indeterminate, solid-appearing  lesion arising from the tail of pancreas. A small pancreatic neoplasm cannot be excluded. Further investigation with nonemergent, contrast enhanced, pancreas protocol MRI is advised. 3. Prostate gland enlargement. 4.  Aortic Atherosclerosis (ICD10-I70.0). Electronically Signed   By: TKerby MoorsM.D.   On: 11/29/2017 12:14   Mr 3d Recon At Scanner  Result Date: 11/29/2017 CLINICAL DATA:  Indeterminate pancreatic tail lesion on CT study from earlier today. 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 EXAM: MRI ABDOMEN WITHOUT AND WITH CONTRAST TECHNIQUE: Multiplanar multisequence MR imaging of the abdomen was performed both before and after the administration of intravenous contrast. CONTRAST:  157mMULTIHANCE GADOBENATE DIMEGLUMINE 529 MG/ML IV SOLN COMPARISON:  11/29/2017 CT abdomen/pelvis. FINDINGS: Lower chest: No acute abnormality at the lung bases. Hepatobiliary: Normal liver size and configuration. No hepatic steatosis. A few scattered tiny subcentimeter simple liver cysts. No suspicious liver masses. Cholecystectomy. Bile ducts are within normal post cholecystectomy limits with no intrahepatic biliary ductal dilatation. Common bile duct diameter 7 mm. No choledocholithiasis. Pancreas: There is a 2.0 x 1.9 cm heterogeneous T2 hyperintense pancreatic tail mass (series 4/image 22), which demonstrates heterogeneous low level internal enhancement. No additional pancreatic lesions. No pancreatic duct dilation. No pancreas divisum. Spleen: Normal size. No mass. Adrenals/Urinary Tract: Normal adrenals. No hydronephrosis. Several simple bilateral renal cysts, largest 2.6 cm in the interpolar left kidney. No suspicious renal masses. Stomach/Bowel: Normal non-distended stomach. Mildly dilated visualized anterior abdominal small bowel loops as seen on CT study from earlier today. No bowel wall thickening. Moderate left colonic diverticulosis. Vascular/Lymphatic: Atherosclerotic nonaneurysmal abdominal aorta. Patent portal,  splenic,  hepatic and renal veins. No pathologically enlarged lymph nodes in the abdomen. Other: No abdominal ascites or focal fluid collection. Musculoskeletal: No aggressive appearing focal osseous lesions. IMPRESSION: 1. Heterogeneous T2 hyperintense 2.0 cm pancreatic tail mass with heterogeneous low level internal enhancement, a high risk feature, for which pancreatic malignancy such as mucinous carcinoma cannot be excluded. GI consultation for endoscopic ultrasound correlation with FNA advised. 2. No findings of metastatic disease in the abdomen. 3. Mildly dilated small bowel loops in the anterior abdomen, better seen on the CT study from earlier today, which described small-bowel obstruction due to a right ventral abdominal hernia. 4. Moderate left colonic diverticulosis. 5.  Aortic Atherosclerosis (ICD10-I70.0). These results will be called to the ordering clinician or representative by the Radiology Department at the imaging location. Electronically Signed   By: Ilona Sorrel M.D.   On: 11/29/2017 15:25   Discharge Exam: Vitals:   11/30/17 0841 11/30/17 1426  BP:  (!) 173/70  Pulse:  69  Resp:  18  Temp:  98.1 F (36.7 C)  SpO2: 93% 94%   Vitals:   11/29/17 2136 11/30/17 0623 11/30/17 0841 11/30/17 1426  BP: (!) 150/66 (!) 132/58  (!) 173/70  Pulse: 66 73  69  Resp:    18  Temp: 98.2 F (36.8 C) 97.8 F (36.6 C)  98.1 F (36.7 C)  TempSrc: Oral Oral  Oral  SpO2: 92% 91% 93% 94%  Weight:      Height:        General: Pt is alert, awake, not in acute distress Cardiovascular: RRR, S1/S2 +, no rubs, no gallops Respiratory: CTA bilaterally, no wheezing, no rhonchi Abdominal: Soft, NT, ND, bowel sounds + Extremities: no edema, no cyanosis    The results of significant diagnostics from this hospitalization (including imaging, microbiology, ancillary and laboratory) are listed below for reference.     Microbiology: Recent Results (from the past 240 hour(s))  Urine culture      Status: Abnormal   Collection Time: 11/29/17  9:29 AM  Result Value Ref Range Status   Specimen Description   Final    URINE, RANDOM Performed at [facility], [address], [place], [place] [address]    Special Requests   Final    NONE Performed at [facility], [address] [place], [place] [address]    Culture (A)  Final    <10,000 COLONIES/mL INSIGNIFICANT GROWTH Performed at [facility] [address], [place], [place] [address]    Report Status 11/30/2017 FINAL  Final     Labs: BNP (last 3 results) No results for input(s): BNP in the last 8760 hours. Basic Metabolic Panel: Recent Labs  Lab 11/29/17 0943 11/30/17 0534  NA 138 139  K 4.2 4.2  CL 103 107  CO2 24 24  GLUCOSE 188* 113*  BUN 40* 27*  CREATININE 1.47* 1.19  CALCIUM 9.5 8.5*   Liver Function Tests: Recent Labs  Lab 11/29/17 0943 11/30/17 0534  AST 25 18  ALT 19 17  ALKPHOS 49 48  BILITOT 1.0 0.9  PROT 6.9 5.9*  ALBUMIN 3.8 3.3*   Recent Labs  Lab 11/29/17 0943  LIPASE 28   No results for input(s): AMMONIA in the last 168 hours. CBC: Recent Labs  Lab 11/29/17 0943 11/30/17 0534  WBC 14.0* 7.4  NEUTROABS 11.3*  --   HGB 15.7 13.8  HCT 45.0 41.6  MCV 88.9 91.0  PLT 176 153   Cardiac Enzymes: No results for  input(s): CKTOTAL, CKMB, CKMBINDEX, TROPONINI in the last 168 hours. BNP: Invalid input(s): POCBNP CBG: Recent Labs  Lab 11/29/17 1655 11/29/17 2137 11/30/17 0447 11/30/17 0729 11/30/17 1124  GLUCAP 85 145* 96 108* 170*   D-Dimer No results for input(s): DDIMER in the last 72 hours. Hgb A1c No results for input(s): HGBA1C in the last 72 hours. Lipid Profile No results for input(s): CHOL, HDL, LDLCALC, TRIG, CHOLHDL, LDLDIRECT in the last 72 hours. Thyroid function studies No results for input(s): TSH, T4TOTAL, T3FREE, THYROIDAB in the last 72 hours.  Invalid input(s): FREET3 Anemia work up No results for input(s): VITAMINB12, FOLATE, FERRITIN, TIBC,  IRON, RETICCTPCT in the last 72 hours. Urinalysis    Component Value Date/Time   COLORURINE YELLOW 11/29/2017 0928   APPEARANCEUR CLEAR 11/29/2017 0928   LABSPEC 1.017 11/29/2017 0928   PHURINE 5.0 11/29/2017 0928   GLUCOSEU NEGATIVE 11/29/2017 0928   HGBUR NEGATIVE 11/29/2017 0928   BILIRUBINUR NEGATIVE 11/29/2017 0928   KETONESUR NEGATIVE 11/29/2017 0928   PROTEINUR 100 (A) 11/29/2017 0928   NITRITE NEGATIVE 11/29/2017 0928   LEUKOCYTESUR NEGATIVE 11/29/2017 0928   Sepsis Labs Invalid input(s): PROCALCITONIN,  WBC,  LACTICIDVEN Microbiology Recent Results (from the past 240 hour(s))  Urine culture     Status: Abnormal   Collection Time: 11/29/17  9:29 AM  Result Value Ref Range Status   Specimen Description   Final    URINE, RANDOM Performed at [facility], [address] [place], [place] [address]    Special Requests   Final    NONE Performed at [facility], [address], [place], [place] [address]    Culture (A)  Final    <10,000 COLONIES/mL INSIGNIFICANT GROWTH Performed at [facility], [place] [address] [place], [place] [address]    Report Status 11/30/2017 FINAL  Final     Time coordinating discharge: 35 minutes  SIGNED:   Rodena Goldmann, DO [facility] 11/30/2017, 3:35 PM Pager [phone]  If 7PM-7AM, please contact night-coverage [email or web address] Password [email or web address]

## 2017-11-30 NOTE — Progress Notes (Signed)
Reviewed chart. Patient was admitted with SBO in setting of ventral abdominal wall hernia. Improved today. General Surgery has evaluated patient. He was also noted to have an indeterminate, solid-appearing lesion arising from the tail of the pancreas on CT. MR abdomen completed with concern for pancreatic malignancy. No evidence of metastatic disease.   He will be going home today. Recommend referral to Dr. Ardis Hughs in [facility] for EUS with FNA as appropriate. We will start the referral process, and patient will be notified shortly regarding next appointment as outpatient.  Annitta Needs, PhD, ANP-BC [facility]

## 2017-11-30 NOTE — Progress Notes (Signed)
Hypoglycemic Event  CBG: 65  Treatment: 15 GM carbohydrate snack  Symptoms: None  Follow-up CBG: Time:1655 CBG Result:85  Possible Reasons for Event: Inadequate meal intake  Comments/MD notified:  Patient has not been eating over the past few days.  Ordered to have clear liquids this evening and blood glucose returned.    Verdis Prime

## 2017-11-30 NOTE — Telephone Encounter (Signed)
Sent to Dr.Jacobs attn to review and advise as to schedule.

## 2017-11-30 NOTE — Care Management Obs Status (Signed)
[place] NOTIFICATION   Patient Details  Name: GREGGORY SAFRANEK MRN: [id number] Date of Birth: 04-07-1931   Medicare Observation Status Notification Given:  Yes    Rohnan Bartleson, Chauncey Reading, RN 11/30/2017, 10:35 AM

## 2017-11-30 NOTE — Telephone Encounter (Signed)
Referral placed.

## 2017-11-30 NOTE — Telephone Encounter (Signed)
[facility] clinical pool:  Patient needs referral to Dr. Ardis Hughs for consideration of EUS with FNA. New finding of pancreatic tail mass on imaging while inpatient for SBO. He has clinically improved from SBO.

## 2017-11-30 NOTE — Consult Note (Signed)
Reason for Consult: Ventral hernia Referring Physician: Dr. Avon Gully Jonathan Avery is an 84 y.o. male.  HPI: Patient is an 84 year old white male status post a partial colectomy in the remote past who presented to the emergency room with nausea and vomiting.  He states he has had an upset stomach.  He is also had a hernia around his umbilicus that is been present for years.  It was sticking out well he was having episodes of emesis.  It does reduce when he lies down.  CT scan of the abdomen done emergency room revealed partial bowel obstruction secondary to small bowel within the hernia sac.  The hernia was easily reducible.  He denies any abdominal pain.  He did have a bowel movement earlier in the day and was passing flatus while in the emergency room.  He was admitted to the hospital for IV hydration and further work-up of the pancreatic mass seen on CT scan of the abdomen.  This morning, he states he feels fine.  He is tolerating clear liquids.  He thinks he just got dehydrated.  He has no abdominal pain.  Past Medical History:  Diagnosis Date  . BPH (benign prostatic hypertrophy)   . Diabetes ([place])   . High cholesterol   . Hypertension   . Myocardial infarction [facility])     Past Surgical History:  Procedure Laterality Date  . CHOLECYSTECTOMY  1973  . COLONOSCOPY  07/20/2006   Dr. [name]:Status post right hemicolectomy. Residual colonic mucosa appeared normal, normal rectum.   . COLONOSCOPY  2006/2007   sprawling villous adenoma at ileocecal valve  . COLONOSCOPY  2011   Dr. [name]: normal rectum, pancolonic diverticulosis, 2 diminutive polyps, with path benign polypoid colonic mucosa  . COLONOSCOPY N/A 01/02/2015   Procedure: COLONOSCOPY;  Surgeon: Daneil Dolin, MD;  Location: [facility];  Service: Endoscopy;  Laterality: N/A;  1245  . open hemicolectomy  2007   due to villous adenoma    Family History  Problem Relation Age of Onset  . Ulcers Father   . Diabetes type II Brother   .  Diabetes type II Brother   . Colon cancer Neg Hx     Social History:  reports that he has quit smoking. His smoking use included cigars. He quit after 10.00 years of use. He has never used smokeless tobacco. He reports that he does not drink alcohol or use drugs.  Allergies: No Known Allergies  Medications: I have reviewed the patient's current medications.  Results for orders placed or performed during the hospital encounter of 11/29/17 (from the past 48 hour(s))  Urinalysis, Routine w reflex microscopic     Status: Abnormal   Collection Time: 11/29/17  9:28 AM  Result Value Ref Range   Color, Urine YELLOW YELLOW   APPearance CLEAR CLEAR   Specific Gravity, Urine 1.017 1.005 - 1.030   pH 5.0 5.0 - 8.0   Glucose, UA NEGATIVE NEGATIVE mg/dL   Hgb urine dipstick NEGATIVE NEGATIVE   Bilirubin Urine NEGATIVE NEGATIVE   Ketones, ur NEGATIVE NEGATIVE mg/dL   Protein, ur 100 (A) NEGATIVE mg/dL   Nitrite NEGATIVE NEGATIVE   Leukocytes, UA NEGATIVE NEGATIVE   RBC / HPF 0-5 0 - 5 RBC/hpf   WBC, UA 0-5 0 - 5 WBC/hpf   Bacteria, UA RARE (A) NONE SEEN   Squamous Epithelial / LPF 0-5 0 - 5   Mucus PRESENT    Hyaline Casts, UA PRESENT     Comment:  Performed at [facility], [address], [place], [place] [address]  Comprehensive metabolic panel     Status: Abnormal   Collection Time: 11/29/17  9:43 AM  Result Value Ref Range   Sodium 138 135 - 145 mmol/L   Potassium 4.2 3.5 - 5.1 mmol/L   Chloride 103 98 - 111 mmol/L    Comment: Please note change in reference range.   CO2 24 22 - 32 mmol/L   Glucose, Bld 188 (H) 70 - 99 mg/dL    Comment: Please note change in reference range.   BUN 40 (H) 8 - 23 mg/dL    Comment: Please note change in reference range.   Creatinine, Ser 1.47 (H) 0.61 - 1.24 mg/dL   Calcium 9.5 8.9 - 10.3 mg/dL   Total Protein 6.9 6.5 - 8.1 g/dL   Albumin 3.8 3.5 - 5.0 g/dL   AST 25 15 - 41 U/L   ALT 19 0 - 44 U/L    Comment: Please note change in reference range.    Alkaline Phosphatase 49 38 - 126 U/L   Total Bilirubin 1.0 0.3 - 1.2 mg/dL   GFR calc non Af Amer 42 (L) >60 mL/min   GFR calc Af Amer 49 (L) >60 mL/min    Comment: (NOTE) The eGFR has been calculated using the CKD EPI equation. This calculation has not been validated in all clinical situations. eGFR's persistently <60 mL/min signify possible Chronic Kidney Disease.    Anion gap 11 5 - 15    Comment: Performed at [facility], [address], [place], [place] [address]  CBC WITH DIFFERENTIAL     Status: Abnormal   Collection Time: 11/29/17  9:43 AM  Result Value Ref Range   WBC 14.0 (H) 4.0 - 10.5 K/uL   RBC 5.06 4.22 - 5.81 MIL/uL   Hemoglobin 15.7 13.0 - 17.0 g/dL   HCT 45.0 39.0 - 52.0 %   MCV 88.9 78.0 - 100.0 fL   MCH 31.0 26.0 - 34.0 pg   MCHC 34.9 30.0 - 36.0 g/dL   RDW 13.3 11.5 - 15.5 %   Platelets 176 150 - 400 K/uL   Neutrophils Relative % 80 %   Neutro Abs 11.3 (H) 1.7 - 7.7 K/uL   Lymphocytes Relative 11 %   Lymphs Abs 1.5 0.7 - 4.0 K/uL   Monocytes Relative 8 %   Monocytes Absolute 1.1 (H) 0.1 - 1.0 K/uL   Eosinophils Relative 1 %   Eosinophils Absolute 0.1 0.0 - 0.7 K/uL   Basophils Relative 0 %   Basophils Absolute 0.0 0.0 - 0.1 K/uL    Comment: Performed at [facility], [address], [place], [place] [address]  Lipase, blood     Status: None   Collection Time: 11/29/17  9:43 AM  Result Value Ref Range   Lipase 28 11 - 51 U/L    Comment: Performed at [facility], [address] [place], [place] [address]  Cancer antigen 19-9     Status: None   Collection Time: 11/29/17  9:43 AM  Result Value Ref Range   CA 19-9 22 0 - 35 U/mL    Comment: (NOTE) Roche [employer]ostics Electrochemiluminescence Immunoassay (ECLIA) Values obtained with different assay methods or kits cannot be used interchangeably.  Results cannot be interpreted as absolute evidence of the presence or absence of malignant disease. Performed At: [facility] [place], [place] [id number] Rush Farmer MD EZ:[id number] Performed at [facility], [phone]  [address], [place], [place] [address]   I-Stat CG4 Lactic Acid, ED  (not at  [facility])     Status: Abnormal   Collection Time: 11/29/17  9:54 AM  Result Value Ref Range   Lactic Acid, Venous 2.66 (HH) 0.5 - 1.9 mmol/L   Comment MD NOTIFIED, REPEAT TEST   I-Stat Troponin, ED (not at [facility])     Status: None   Collection Time: 11/29/17  9:58 AM  Result Value Ref Range   Troponin i, poc 0.01 0.00 - 0.08 ng/mL   Comment 3            Comment: Due to the release kinetics of cTnI, a negative result within the first hours of the onset of symptoms does not rule out myocardial infarction with certainty. If myocardial infarction is still suspected, repeat the test at appropriate intervals.   POC CBG, ED     Status: Abnormal   Collection Time: 11/29/17 10:09 AM  Result Value Ref Range   Glucose-Capillary 158 (H) 70 - 99 mg/dL  I-Stat CG4 Lactic Acid, ED  (not at  [facility])     Status: None   Collection Time: 11/29/17 12:51 PM  Result Value Ref Range   Lactic Acid, Venous 1.71 0.5 - 1.9 mmol/L  Glucose, capillary     Status: Abnormal   Collection Time: 11/29/17  4:33 PM  Result Value Ref Range   Glucose-Capillary 65 (L) 70 - 99 mg/dL   Comment 1 Notify RN    Comment 2 Document in Chart   Glucose, capillary     Status: None   Collection Time: 11/29/17  4:55 PM  Result Value Ref Range   Glucose-Capillary 85 70 - 99 mg/dL   Comment 1 Notify RN    Comment 2 Document in Chart   Glucose, capillary     Status: Abnormal   Collection Time: 11/29/17  9:37 PM  Result Value Ref Range   Glucose-Capillary 145 (H) 70 - 99 mg/dL  Glucose, capillary     Status: None   Collection Time: 11/30/17  4:47 AM  Result Value Ref Range   Glucose-Capillary 96 70 - 99 mg/dL  Comprehensive metabolic panel     Status: Abnormal   Collection Time: 11/30/17  5:34 AM  Result Value Ref Range   Sodium 139 135 - 145 mmol/L   Potassium  4.2 3.5 - 5.1 mmol/L   Chloride 107 98 - 111 mmol/L    Comment: Please note change in reference range.   CO2 24 22 - 32 mmol/L   Glucose, Bld 113 (H) 70 - 99 mg/dL    Comment: Please note change in reference range.   BUN 27 (H) 8 - 23 mg/dL    Comment: Please note change in reference range.   Creatinine, Ser 1.19 0.61 - 1.24 mg/dL   Calcium 8.5 (L) 8.9 - 10.3 mg/dL   Total Protein 5.9 (L) 6.5 - 8.1 g/dL   Albumin 3.3 (L) 3.5 - 5.0 g/dL   AST 18 15 - 41 U/L   ALT 17 0 - 44 U/L    Comment: Please note change in reference range.   Alkaline Phosphatase 48 38 - 126 U/L   Total Bilirubin 0.9 0.3 - 1.2 mg/dL   GFR calc non Af Amer 54 (L) >60 mL/min   GFR calc Af Amer >60 >60 mL/min    Comment: (NOTE) The eGFR has been calculated using the CKD EPI equation. This calculation has not been validated in all clinical situations.  eGFR's persistently <60 mL/min signify possible Chronic Kidney Disease.    Anion gap 8 5 - 15    Comment: Performed at [facility], [address] [place], [place] [address]  CBC     Status: None   Collection Time: 11/30/17  5:34 AM  Result Value Ref Range   WBC 7.4 4.0 - 10.5 K/uL   RBC 4.57 4.22 - 5.81 MIL/uL   Hemoglobin 13.8 13.0 - 17.0 g/dL   HCT 41.6 39.0 - 52.0 %   MCV 91.0 78.0 - 100.0 fL   MCH 30.2 26.0 - 34.0 pg   MCHC 33.2 30.0 - 36.0 g/dL   RDW 13.6 11.5 - 15.5 %   Platelets 153 150 - 400 K/uL    Comment: Performed at [facility], [address], [place], [place] [address]  Glucose, capillary     Status: Abnormal   Collection Time: 11/30/17  7:29 AM  Result Value Ref Range   Glucose-Capillary 108 (H) 70 - 99 mg/dL   Comment 1 Notify RN    Comment 2 Document in Chart     Dg Chest 2 View  Result Date: 11/29/2017 CLINICAL DATA:  84 year old with hyperglycemia over the past several days. Two-day history of cough, nausea/vomiting and generalized weakness. Diminished breath sounds to the lung bases on physical examination. EXAM: CHEST - 2 VIEW  COMPARISON:  05/06/2015, 02/07/2008.  CTA chest 02/07/2008. FINDINGS: Cardiac silhouette upper normal in size, unchanged. Thoracic aorta mildly tortuous and atherosclerotic, unchanged. Hilar and mediastinal contours otherwise unremarkable. Minimal linear scarring in the lingula, unchanged. Lungs otherwise clear. Bronchovascular markings normal. Pulmonary vascularity normal. No visible pleural effusions. No pneumothorax. Degenerative changes involving the thoracic spine. IMPRESSION: No acute cardiopulmonary disease. Electronically Signed   By: Evangeline Dakin M.D.   On: 11/29/2017 10:29   Mr Abdomen W Wo Contrast  Result Date: 11/29/2017 CLINICAL DATA:  Indeterminate pancreatic tail lesion on CT study from earlier today. 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 EXAM: MRI ABDOMEN WITHOUT AND WITH CONTRAST TECHNIQUE: Multiplanar multisequence MR imaging of the abdomen was performed both before and after the administration of intravenous contrast. CONTRAST:  60m MULTIHANCE GADOBENATE DIMEGLUMINE 529 MG/ML IV SOLN COMPARISON:  11/29/2017 CT abdomen/pelvis. FINDINGS: Lower chest: No acute abnormality at the lung bases. Hepatobiliary: Normal liver size and configuration. No hepatic steatosis. A few scattered tiny subcentimeter simple liver cysts. No suspicious liver masses. Cholecystectomy. Bile ducts are within normal post cholecystectomy limits with no intrahepatic biliary ductal dilatation. Common bile duct diameter 7 mm. No choledocholithiasis. Pancreas: There is a 2.0 x 1.9 cm heterogeneous T2 hyperintense pancreatic tail mass (series 4/image 22), which demonstrates heterogeneous low level internal enhancement. No additional pancreatic lesions. No pancreatic duct dilation. No pancreas divisum. Spleen: Normal size. No mass. Adrenals/Urinary Tract: Normal adrenals. No hydronephrosis. Several simple bilateral renal cysts, largest 2.6 cm in the interpolar left kidney. No suspicious renal masses. Stomach/Bowel: Normal  non-distended stomach. Mildly dilated visualized anterior abdominal small bowel loops as seen on CT study from earlier today. No bowel wall thickening. Moderate left colonic diverticulosis. Vascular/Lymphatic: Atherosclerotic nonaneurysmal abdominal aorta. Patent portal, splenic, hepatic and renal veins. No pathologically enlarged lymph nodes in the abdomen. Other: No abdominal ascites or focal fluid collection. Musculoskeletal: No aggressive appearing focal osseous lesions. IMPRESSION: 1. Heterogeneous T2 hyperintense 2.0 cm pancreatic tail mass with heterogeneous low level internal enhancement, a high risk feature, for which pancreatic malignancy such as mucinous carcinoma cannot be excluded. GI consultation for endoscopic ultrasound correlation with FNA advised.  2. No findings of metastatic disease in the abdomen. 3. Mildly dilated small bowel loops in the anterior abdomen, better seen on the CT study from earlier today, which described small-bowel obstruction due to a right ventral abdominal hernia. 4. Moderate left colonic diverticulosis. 5.  Aortic Atherosclerosis (ICD10-I70.0). These results will be called to the ordering clinician or representative by the Radiology Department at the imaging location. Electronically Signed   By: Ilona Sorrel M.D.   On: 11/29/2017 15:25   Ct Abdomen Pelvis W Contrast  Result Date: 11/29/2017 CLINICAL DATA:  Lower abdominal pain with nausea. EXAM: CT ABDOMEN AND PELVIS WITH CONTRAST TECHNIQUE: Multidetector CT imaging of the abdomen and pelvis was performed using the standard protocol following bolus administration of intravenous contrast. CONTRAST:  2m ISOVUE-300 IOPAMIDOL (ISOVUE-300) INJECTION 61% COMPARISON:  None FINDINGS: Lower chest: No acute abnormality. Atherosclerotic calcifications noted. Calcifications in the RCA coronary artery also identified. Hepatobiliary: No focal liver abnormality is seen. Status post cholecystectomy. No biliary dilatation. Pancreas:  There is an indeterminate solid-appearing lesion involving the distal tail of pancreas which appears new from previous exam measuring 2 cm, image 27/2 and image 75/6. Spleen: The spleen appears normal. Adrenals/Urinary Tract: The adrenal glands appear normal. Bilateral kidney cysts noted including a 2.5 cm interpolar left kidney cyst. No mass or hydronephrosis. Mild diffuse bladder wall thickening identified.Calcification in the left posterior bladder base is noted measuring 5 mm. Stomach/Bowel: The stomach is normal. There is increase caliber of the mid small bowel loops which measure up to 3.3 cm, image 62/2. Transition point appears to be within a small ventral abdominal wall hernia, slightly right of the midline which contains a knuckle of small bowel, image 65/2. Status post right hemicolectomy. Scattered colonic diverticula noted without acute inflammation. Vascular/Lymphatic: Aortic atherosclerosis. No aneurysm. No enlarged abdominal or pelvic lymph nodes. Reproductive: Prostate gland appears enlarged measuring 6 by 6.2 by 6.2 cm (volume = 100 cm^3). Other: No free fluid or fluid collections. No pneumoperitoneum identified. Musculoskeletal: Spondylosis identified within the thoracic and lumbar spine. No aggressive lytic or sclerotic bone lesions. IMPRESSION: 1. Exam positive for small bowel obstruction. The transition point is within a ventral abdominal wall hernia containing a knuckle of small bowel which is slightly right of the midline. 2. There is an indeterminate, solid-appearing lesion arising from the tail of pancreas. A small pancreatic neoplasm cannot be excluded. Further investigation with nonemergent, contrast enhanced, pancreas protocol MRI is advised. 3. Prostate gland enlargement. 4.  Aortic Atherosclerosis (ICD10-I70.0). Electronically Signed   By: TKerby MoorsM.D.   On: 11/29/2017 12:14   Mr 3d Recon At Scanner  Result Date: 11/29/2017 CLINICAL DATA:  Indeterminate pancreatic tail lesion  on CT study from earlier today. 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 EXAM: MRI ABDOMEN WITHOUT AND WITH CONTRAST TECHNIQUE: Multiplanar multisequence MR imaging of the abdomen was performed both before and after the administration of intravenous contrast. CONTRAST:  134mMULTIHANCE GADOBENATE DIMEGLUMINE 529 MG/ML IV SOLN COMPARISON:  11/29/2017 CT abdomen/pelvis. FINDINGS: Lower chest: No acute abnormality at the lung bases. Hepatobiliary: Normal liver size and configuration. No hepatic steatosis. A few scattered tiny subcentimeter simple liver cysts. No suspicious liver masses. Cholecystectomy. Bile ducts are within normal post cholecystectomy limits with no intrahepatic biliary ductal dilatation. Common bile duct diameter 7 mm. No choledocholithiasis. Pancreas: There is a 2.0 x 1.9 cm heterogeneous T2 hyperintense pancreatic tail mass (series 4/image 22), which demonstrates heterogeneous low level internal enhancement. No additional pancreatic lesions. No pancreatic duct dilation. No  pancreas divisum. Spleen: Normal size. No mass. Adrenals/Urinary Tract: Normal adrenals. No hydronephrosis. Several simple bilateral renal cysts, largest 2.6 cm in the interpolar left kidney. No suspicious renal masses. Stomach/Bowel: Normal non-distended stomach. Mildly dilated visualized anterior abdominal small bowel loops as seen on CT study from earlier today. No bowel wall thickening. Moderate left colonic diverticulosis. Vascular/Lymphatic: Atherosclerotic nonaneurysmal abdominal aorta. Patent portal, splenic, hepatic and renal veins. No pathologically enlarged lymph nodes in the abdomen. Other: No abdominal ascites or focal fluid collection. Musculoskeletal: No aggressive appearing focal osseous lesions. IMPRESSION: 1. Heterogeneous T2 hyperintense 2.0 cm pancreatic tail mass with heterogeneous low level internal enhancement, a high risk feature, for which pancreatic malignancy such as mucinous carcinoma cannot be excluded.  GI consultation for endoscopic ultrasound correlation with FNA advised. 2. No findings of metastatic disease in the abdomen. 3. Mildly dilated small bowel loops in the anterior abdomen, better seen on the CT study from earlier today, which described small-bowel obstruction due to a right ventral abdominal hernia. 4. Moderate left colonic diverticulosis. 5.  Aortic Atherosclerosis (ICD10-I70.0). These results will be called to the ordering clinician or representative by the Radiology Department at the imaging location. Electronically Signed   By: Ilona Sorrel M.D.   On: 11/29/2017 15:25    ROS:  Pertinent items are noted in HPI.  Blood pressure (!) 132/58, pulse 73, temperature 97.8 F (36.6 C), temperature source Oral, resp. rate 20, height _0  (1.778 m), weight 236 lb 5.3 oz (107.2 kg), SpO2 93 %. Physical Exam: Well-developed well-nourished white male no acute distress Head is normocephalic, atraumatic Lungs are clear to auscultation with equal breath sounds bilaterally Heart examination reveals a regular rate and rhythm without S3, S4, murmurs Abdomen is soft, nontender, nondistended.  A 4 cm ovoid incisional hernias noted to the right of the umbilicus along the inferior portion of the surgical scar.  It is easily reducible.  Bowel sounds are active.  CT scan images personally reviewed  Assessment/Plan: Impression: Incisional hernia.  Does not appear to be causing obstruction at this time. Pancreatic mass Plan: No need for acute surgical intervention at this time to repair the incisional hernia.  Patient states he has had it for years and it has never caused him a problem.  He only wants it fixed when absolutely necessary.  Patient is undergoing further work-up of his pancreatic mass.  May advance diet as tolerated.  Will follow with you.  Aviva Signs 11/30/2017, 9:22 AM

## 2017-12-01 ENCOUNTER — Other Ambulatory Visit: Payer: Self-pay

## 2017-12-01 DIAGNOSIS — K8689 Other specified diseases of pancreas: Secondary | ICD-10-CM

## 2017-12-01 NOTE — Telephone Encounter (Signed)
Happy to help.   Patty, He needs upper eus, radial +/- linear, next available MAC Thursday case for pancreatic tail mass.  Thanks

## 2017-12-01 NOTE — Telephone Encounter (Signed)
EUS scheduled, pt instructed and medications reviewed.  Patient instructions mailed to home.  Patient to call with any questions or concerns.  

## 2017-12-07 DIAGNOSIS — K56609 Unspecified intestinal obstruction, unspecified as to partial versus complete obstruction: Secondary | ICD-10-CM | POA: Diagnosis not present

## 2018-01-13 ENCOUNTER — Ambulatory Visit (HOSPITAL_COMMUNITY): Payer: Medicare HMO | Admitting: Registered Nurse

## 2018-01-13 ENCOUNTER — Ambulatory Visit (HOSPITAL_COMMUNITY)
Admission: RE | Admit: 2018-01-13 | Discharge: 2018-01-13 | Disposition: A | Discharge status: Home / Self Care | Payer: Medicare HMO | Source: Ambulatory Visit | Attending: Gastroenterology | Admitting: Gastroenterology

## 2018-01-13 ENCOUNTER — Encounter (HOSPITAL_COMMUNITY): Payer: Self-pay | Admitting: *Deleted

## 2018-01-13 ENCOUNTER — Other Ambulatory Visit: Payer: Self-pay

## 2018-01-13 ENCOUNTER — Encounter (HOSPITAL_COMMUNITY)
Admission: RE | Disposition: A | Discharge status: Home / Self Care | Payer: Self-pay | Source: Ambulatory Visit | Attending: Gastroenterology

## 2018-01-13 DIAGNOSIS — Z87891 Personal history of nicotine dependence: Secondary | ICD-10-CM | POA: Diagnosis not present

## 2018-01-13 DIAGNOSIS — K869 Disease of pancreas, unspecified: Secondary | ICD-10-CM

## 2018-01-13 DIAGNOSIS — D490 Neoplasm of unspecified behavior of digestive system: Secondary | ICD-10-CM | POA: Diagnosis not present

## 2018-01-13 DIAGNOSIS — K8689 Other specified diseases of pancreas: Secondary | ICD-10-CM

## 2018-01-13 DIAGNOSIS — R933 Abnormal findings on diagnostic imaging of other parts of digestive tract: Secondary | ICD-10-CM

## 2018-01-13 DIAGNOSIS — I10 Essential (primary) hypertension: Secondary | ICD-10-CM | POA: Diagnosis not present

## 2018-01-13 DIAGNOSIS — I129 Hypertensive chronic kidney disease with stage 1 through stage 4 chronic kidney disease, or unspecified chronic kidney disease: Secondary | ICD-10-CM | POA: Diagnosis not present

## 2018-01-13 DIAGNOSIS — N183 Chronic kidney disease, stage 3 (moderate): Secondary | ICD-10-CM | POA: Diagnosis not present

## 2018-01-13 DIAGNOSIS — K2970 Gastritis, unspecified, without bleeding: Secondary | ICD-10-CM | POA: Diagnosis not present

## 2018-01-13 DIAGNOSIS — I252 Old myocardial infarction: Secondary | ICD-10-CM | POA: Insufficient documentation

## 2018-01-13 DIAGNOSIS — Z794 Long term (current) use of insulin: Secondary | ICD-10-CM | POA: Insufficient documentation

## 2018-01-13 DIAGNOSIS — E119 Type 2 diabetes mellitus without complications: Secondary | ICD-10-CM | POA: Diagnosis not present

## 2018-01-13 HISTORY — PX: ESOPHAGOGASTRODUODENOSCOPY (EGD) WITH PROPOFOL: SHX5813

## 2018-01-13 HISTORY — PX: EUS: SHX5427

## 2018-01-13 HISTORY — PX: FINE NEEDLE ASPIRATION: SHX5430

## 2018-01-13 LAB — GLUCOSE, CAPILLARY: GLUCOSE-CAPILLARY: 248 mg/dL — AB (ref 70–99)

## 2018-01-13 SURGERY — UPPER ENDOSCOPIC ULTRASOUND (EUS) RADIAL
Anesthesia: Monitor Anesthesia Care

## 2018-01-13 MED ORDER — LIDOCAINE 2% (20 MG/ML) 5 ML SYRINGE
INTRAMUSCULAR | Status: DC | PRN
  Administered 2018-01-13: 60 mg via INTRAVENOUS

## 2018-01-13 MED ORDER — PROPOFOL 10 MG/ML IV BOLUS
INTRAVENOUS | Status: AC
  Filled 2018-01-13: qty 20

## 2018-01-13 MED ORDER — SODIUM CHLORIDE 0.9 % IV SOLN: INTRAVENOUS | Status: DC

## 2018-01-13 MED ORDER — PROPOFOL 10 MG/ML IV BOLUS
INTRAVENOUS | Status: AC
  Filled 2018-01-13: qty 40

## 2018-01-13 MED ORDER — ONDANSETRON HCL 4 MG/2ML IJ SOLN
INTRAMUSCULAR | Status: DC | PRN
  Administered 2018-01-13: 4 mg via INTRAVENOUS

## 2018-01-13 MED ORDER — LACTATED RINGERS IV SOLN
INTRAVENOUS | Status: DC
  Administered 2018-01-13: 08:00:00 via INTRAVENOUS

## 2018-01-13 MED ORDER — PROPOFOL 10 MG/ML IV BOLUS
INTRAVENOUS | Status: DC | PRN
  Administered 2018-01-13 (×4): 20 mg via INTRAVENOUS
  Administered 2018-01-13: 30 mg via INTRAVENOUS
  Administered 2018-01-13 (×17): 20 mg via INTRAVENOUS

## 2018-01-13 NOTE — Anesthesia Preprocedure Evaluation (Signed)
Anesthesia Evaluation  Patient identified by MRN, date of birth, ID band Patient awake    Reviewed: Allergy & Precautions, NPO status , Patient's Chart, lab work & pertinent test results  Airway Mallampati: I  TM Distance: >3 FB Neck ROM: Full    Dental   Pulmonary former smoker,    Pulmonary exam normal        Cardiovascular hypertension, Pt. on medications + Past MI  Normal cardiovascular exam     Neuro/Psych    GI/Hepatic   Endo/Other  diabetes, Type 2, Insulin Dependent  Renal/GU Renal InsufficiencyRenal disease     Musculoskeletal   Abdominal   Peds  Hematology   Anesthesia Other Findings   Reproductive/Obstetrics                             Anesthesia Physical Anesthesia Plan  ASA: III  Anesthesia Plan: MAC   Post-op Pain Management:    Induction: Intravenous  PONV Risk Score and Plan: 1  Airway Management Planned: Simple Face Mask  Additional Equipment:   Intra-op Plan:   Post-operative Plan:   Informed Consent: I have reviewed the patients History and Physical, chart, labs and discussed the procedure including the risks, benefits and alternatives for the proposed anesthesia with the patient or authorized representative who has indicated his/her understanding and acceptance.     Plan Discussed with: CRNA and Surgeon  Anesthesia Plan Comments:         Anesthesia Quick Evaluation

## 2018-01-13 NOTE — Op Note (Signed)
[facility] Patient Name: Jonathan Avery Procedure Date: 01/13/2018 MRN: [id number] Attending MD: [name] , MD Date of Birth: 1931-01-11 CSN: [id number] Age: 84 Admit Type: Outpatient Procedure:                Upper EUS Indications:              incidental mass in tail of pancreas, noted during                            recent admission for SBO. No personal or FH of                            pancreatic disease, no weigth loss, no etoh abuse                            in past. Providers:                [name], MD, Cleda Daub, RN, Elspeth Cho Tech., Technician, Marla Roe, CRNA Referring MD:             Milton Ferguson, MD Medicines:                Monitored Anesthesia Care Complications:            No immediate complications. Estimated blood loss:                            None. Estimated Blood Loss:     Estimated blood loss: none. Procedure:                Pre-Anesthesia Assessment:                           - Prior to the procedure, a History and Physical                            was performed, and patient medications and                            allergies were reviewed. The patient's tolerance of                            previous anesthesia was also reviewed. The risks                            and benefits of the procedure and the sedation                            options and risks were discussed with the patient.                            All questions were answered, and informed consent  was obtained. Prior Anticoagulants: The patient has                            taken no previous anticoagulant or antiplatelet                            agents. ASA Grade Assessment: II - A patient with                            mild systemic disease. After reviewing the risks                            and benefits, the patient was deemed in                            satisfactory condition to  undergo the procedure.                           After obtaining informed consent, the endoscope was                            passed under direct vision. Throughout the                            procedure, the patient's blood pressure, pulse, and                            oxygen saturations were monitored continuously. The                            [id number] ([id number]) Olympus Radial EUS was                            introduced through the mouth, and advanced to the                            second part of duodenum. The upper EUS was                            accomplished without difficulty. The patient                            tolerated the procedure well. Scope In: Scope Out: Findings:      ENDOSCOPIC FINDING: :      The examined esophagus was endoscopically normal.      There was non-specific mild gastritis.      The examined duodenum was endoscopically normal.      ENDOSONOGRAPHIC FINDING: :      1. A round mass was identified in the pancreatic tail. The mass was       mixed solid and cystic. The mass measured 1.9cm in maximal       cross-sectional diameter and it clearly communicated with the main       pancreatic duct. The endosonographic borders were well-defined. The       remainder  of the pancreas was examined. The mass does not involve any       significant vascular structures. Fine needle aspiration for cytology was       performed. Color Doppler imaging was utilized prior to needle puncture       to confirm a lack of significant vascular structures within the needle       path. Two passes were made with the 25 gauge needle using a transgastric       approach. A cytotechnologist was present to evaluate the adequacy of the       specimen. Final cytology results are pending.      2. The main pancreatic duct communicates with the mass in the tail of       pancreas and is overall slightly dilated in the body, tail of the gland       (67m in body).      3. No  peripancreatic adenopathy.      4. CBD was normal, non-dilated and contained no stones.      5. Limited views of the liver, spleen, portal and splenic vessels were       all normal. Impression:               - Incidental 1.9cm mixed cystic/solid mass in the                            tail of pancreas, directly communicating with the                            main pancreatic duct. Preliminary cytology reviews                            shows this is clearly a neolastic lesion with                            abundant mucin (IPMN vs MCN). Await final cytology                            but I doubt underlying invasive cancer currently. Moderate Sedation:      N/A- Per Anesthesia Care Recommendation:           - Discharge patient to home (ambulatory).                           - Await final cytology, will also present this case                            at next week GI tumor board. Procedure Code(s):        --- Professional ---                           [id number] Esophagogastroduodenoscopy, flexible,                            transoral; with transendoscopic ultrasound-guided                            intramural or transmural fine needle  aspiration/biopsy(s), (includes endoscopic                            ultrasound examination limited to the esophagus,                            stomach or duodenum, and adjacent structures) Diagnosis Code(s):        --- Professional ---                           K86.89, Other specified diseases of pancreas                           R93.3, Abnormal findings on diagnostic imaging of                            other parts of digestive tract CPT copyright 2017 American Medical Association. All rights reserved. The codes documented in this report are preliminary and upon coder review may  be revised to meet current compliance requirements. [name], MD 01/13/2018 9:27:09 AM This report has been signed electronically. Number of  Addenda: 0

## 2018-01-13 NOTE — Anesthesia Procedure Notes (Signed)
Date/Time: 01/13/2018 8:34 AM Performed by: Talbot Grumbling, CRNA Oxygen Delivery Method: Nasal cannula

## 2018-01-13 NOTE — Discharge Instructions (Signed)
YOU HAD AN ENDOSCOPIC PROCEDURE TODAY: Refer to the procedure report and other information in the discharge instructions given to you for any specific questions about what was found during the examination. If this information does not answer your questions, please call [place] office at [phone] to clarify.   YOU SHOULD EXPECT: Some feelings of bloating in the abdomen. Passage of more gas than usual. Walking can help get rid of the air that was put into your GI tract during the procedure and reduce the bloating. If you had a lower endoscopy (such as a colonoscopy or flexible sigmoidoscopy) you may notice spotting of blood in your stool or on the toilet paper. Some abdominal soreness may be present for a day or two, also.  DIET: Your first meal following the procedure should be a light meal and then it is ok to progress to your normal diet. A half-sandwich or bowl of soup is an example of a good first meal. Heavy or fried foods are harder to digest and may make you feel nauseous or bloated. Drink plenty of fluids but you should avoid alcoholic beverages for 24 hours. If you had a esophageal dilation, please see attached instructions for diet.    ACTIVITY: Your care partner should take you home directly after the procedure. You should plan to take it easy, moving slowly for the rest of the day. You can resume normal activity the day after the procedure however YOU SHOULD NOT DRIVE, use power tools, machinery or perform tasks that involve climbing or major physical exertion for 24 hours (because of the sedation medicines used during the test).   SYMPTOMS TO REPORT IMMEDIATELY: A gastroenterologist can be reached at any hour. Please call [phone]  for any of the following symptoms:   Following upper endoscopy (EGD, EUS, ERCP, esophageal dilation) Vomiting of blood or coffee ground material  New, significant abdominal pain  New, significant chest pain or pain under the shoulder blades  Painful or  persistently difficult swallowing  New shortness of breath  Black, tarry-looking or red, bloody stools  FOLLOW UP:  If any biopsies were taken you will be contacted by phone or by letter within the next 1-3 weeks. Call [phone]  if you have not heard about the biopsies in 3 weeks.  Please also call with any specific questions about appointments or follow up tests.  

## 2018-01-13 NOTE — Transfer of Care (Signed)
Immediate Anesthesia Transfer of Care Note  Patient: Jonathan Avery  Procedure(s) Performed: UPPER ENDOSCOPIC ULTRASOUND (EUS) RADIAL (N/A ) FINE NEEDLE ASPIRATION (FNA) LINEAR (N/A )  Patient Location: PACU  Anesthesia Type:MAC  Level of Consciousness: sedated  Airway & Oxygen Therapy: Patient Spontanous Breathing and Patient connected to nasal cannula oxygen  Post-op Assessment: Report given to RN and Post -op Vital signs reviewed and stable  Post vital signs: Reviewed and stable  Last Vitals:  Vitals Value Taken Time  BP    Temp    Pulse    Resp    SpO2      Last Pain:  Vitals:   01/13/18 0743  TempSrc: Oral  PainSc: 0-No pain         Complications: No apparent anesthesia complications

## 2018-01-13 NOTE — H&P (Signed)
HPI: This is an 84 yo man  Chief complaint is abnormal pancreas  Was found to have a mass in tail of pancreas, incidentally noted during abdmission for SBO  ROS: complete GI ROS as described in HPI, all other review negative.  Constitutional:  No unintentional weight loss   Past Medical History:  Diagnosis Date  . BPH (benign prostatic hypertrophy)   . Diabetes ([place])   . High cholesterol   . Hypertension   . Myocardial infarction [facility])     Past Surgical History:  Procedure Laterality Date  . CHOLECYSTECTOMY  1973  . COLONOSCOPY  07/20/2006   Dr. [name]:Status post right hemicolectomy. Residual colonic mucosa appeared normal, normal rectum.   . COLONOSCOPY  2006/2007   sprawling villous adenoma at ileocecal valve  . COLONOSCOPY  2011   Dr. [name]: normal rectum, pancolonic diverticulosis, 2 diminutive polyps, with path benign polypoid colonic mucosa  . COLONOSCOPY N/A 01/02/2015   Procedure: COLONOSCOPY;  Surgeon: Daneil Dolin, MD;  [facility];  Service: Endoscopy;  Laterality: N/A;  1245  . open hemicolectomy  2007   due to villous adenoma    Current Facility-Administered Medications  Medication Dose Route Frequency Provider Last Rate Last Dose  . 0.9 %  sodium chloride infusion   Intravenous Continuous [name], MD      . lactated ringers infusion   Intravenous Continuous [name], MD 125 mL/hr at 01/13/18 7124      Allergies as of 12/01/2017  . (No Known Allergies)    Family History  Problem Relation Age of Onset  . Ulcers Father   . Diabetes type II Brother   . Diabetes type II Brother   . Colon cancer Neg Hx     Social History   Socioeconomic History  . Marital status: Widowed    Spouse name: Not on file  . Number of children: Not on file  . Years of education: Not on file  . Highest education level: Not on file  Occupational History  . Not on file  Social Needs  . Financial resource strain: Not on file  . Food  insecurity:    Worry: Not on file    Inability: Not on file  . Transportation needs:    Medical: Not on file    Non-medical: Not on file  Tobacco Use  . Smoking status: Former Smoker    Years: 10.00    Types: Cigars  . Smokeless tobacco: Never Used  Substance and Sexual Activity  . Alcohol use: No    Alcohol/week: 0.0 standard drinks  . Drug use: No  . Sexual activity: Not Currently  Lifestyle  . Physical activity:    Days per week: Not on file    Minutes per session: Not on file  . Stress: Not on file  Relationships  . Social connections:    Talks on phone: Not on file    Gets together: Not on file    Attends religious service: Not on file    Active member of club or organization: Not on file    Attends meetings of clubs or organizations: Not on file    Relationship status: Not on file  . Intimate partner violence:    Fear of current or ex partner: Not on file    Emotionally abused: Not on file    Physically abused: Not on file    Forced sexual activity: Not on file  Other Topics Concern  . Not on file  Social History Narrative  . Not on file     Physical Exam: BP (!) 208/78   Pulse (!) 55   Temp 97.8 F (36.6 C) (Oral)   Resp (!) 23   Ht 5' 10"  (1.778 m)   Wt 108.9 kg   SpO2 96%   BMI 34.44 kg/m  Constitutional: generally well-appearing Psychiatric: alert and oriented x3 Abdomen: soft, nontender, nondistended, no obvious ascites, no peritoneal signs, normal bowel sounds No peripheral edema noted in lower extremities  Assessment and plan: 84 y.o. male with mass in tail of pancreas  For upper EUS today.  Please see the "Patient Instructions" section for addition details about the plan.  Owens Loffler, MD [facility] 01/13/2018, 8:14 AM

## 2018-01-13 NOTE — Anesthesia Postprocedure Evaluation (Signed)
Anesthesia Post Note  Patient: Jonathan Avery  Procedure(s) Performed: UPPER ENDOSCOPIC ULTRASOUND (EUS) RADIAL (N/A ) FINE NEEDLE ASPIRATION (FNA) LINEAR (N/A )     Patient location during evaluation: PACU Anesthesia Type: MAC Level of consciousness: awake and alert Pain management: pain level controlled Vital Signs Assessment: post-procedure vital signs reviewed and stable Respiratory status: spontaneous breathing, nonlabored ventilation, respiratory function stable and patient connected to nasal cannula oxygen Cardiovascular status: stable and blood pressure returned to baseline Postop Assessment: no apparent nausea or vomiting Anesthetic complications: no    Last Vitals:  Vitals:   01/13/18 0930 01/13/18 0940  BP: (!) 158/64 (!) 176/63  Pulse: (!) 52 (!) 51  Resp: 16 15  Temp:    SpO2: 92% 94%    Last Pain:  Vitals:   01/13/18 0940  TempSrc:   PainSc: 0-No pain                 Alizza Sacra DAVID

## 2018-01-14 ENCOUNTER — Encounter (HOSPITAL_COMMUNITY): Payer: Self-pay | Admitting: Gastroenterology

## 2018-01-26 ENCOUNTER — Telehealth: Payer: Self-pay | Admitting: Gastroenterology

## 2018-01-26 NOTE — Telephone Encounter (Signed)
There was no answer on his home phone.  I left a voicemail on his mobile number for him to call back to the office to discuss his recent biopsy results, tumor board discussion.

## 2018-01-27 NOTE — Telephone Encounter (Signed)
We spoke about the path today on the phone.    Patty, He needs a referral to Dr. Barry Dienes at [facility] for tail of pancreas lesion. It is not Markeis cancer.  Surveillance imaging is reasonable but would like Dr. Marlowe Aschoff opinion on potential resection.

## 2018-01-27 NOTE — Telephone Encounter (Signed)
The referral has been sent to [facility] for appt with Dr Barry Dienes.

## 2018-02-28 DIAGNOSIS — I252 Old myocardial infarction: Secondary | ICD-10-CM | POA: Diagnosis not present

## 2018-02-28 DIAGNOSIS — K8689 Other specified diseases of pancreas: Secondary | ICD-10-CM | POA: Diagnosis not present

## 2018-03-08 DIAGNOSIS — R001 Bradycardia, unspecified: Secondary | ICD-10-CM | POA: Diagnosis not present

## 2018-03-08 DIAGNOSIS — E1129 Type 2 diabetes mellitus with other diabetic kidney complication: Secondary | ICD-10-CM | POA: Diagnosis not present

## 2018-03-08 DIAGNOSIS — I10 Essential (primary) hypertension: Secondary | ICD-10-CM | POA: Diagnosis not present

## 2018-03-08 DIAGNOSIS — E785 Hyperlipidemia, unspecified: Secondary | ICD-10-CM | POA: Diagnosis not present

## 2018-03-08 DIAGNOSIS — Z79899 Other long term (current) drug therapy: Secondary | ICD-10-CM | POA: Diagnosis not present

## 2018-03-08 DIAGNOSIS — I2510 Atherosclerotic heart disease of native coronary artery without angina pectoris: Secondary | ICD-10-CM | POA: Diagnosis not present

## 2018-03-14 DIAGNOSIS — E1129 Type 2 diabetes mellitus with other diabetic kidney complication: Secondary | ICD-10-CM | POA: Diagnosis not present

## 2018-03-14 DIAGNOSIS — Z23 Encounter for immunization: Secondary | ICD-10-CM | POA: Diagnosis not present

## 2018-03-14 DIAGNOSIS — I700 Atherosclerosis of aorta: Secondary | ICD-10-CM | POA: Diagnosis not present

## 2018-03-14 DIAGNOSIS — R001 Bradycardia, unspecified: Secondary | ICD-10-CM | POA: Diagnosis not present

## 2018-03-14 DIAGNOSIS — Z0001 Encounter for general adult medical examination with abnormal findings: Secondary | ICD-10-CM | POA: Diagnosis not present

## 2018-03-14 DIAGNOSIS — Z6835 Body mass index (BMI) 35.0-35.9, adult: Secondary | ICD-10-CM | POA: Diagnosis not present

## 2018-03-14 DIAGNOSIS — I10 Essential (primary) hypertension: Secondary | ICD-10-CM | POA: Diagnosis not present

## 2018-03-14 DIAGNOSIS — K869 Disease of pancreas, unspecified: Secondary | ICD-10-CM | POA: Diagnosis not present

## 2018-03-28 ENCOUNTER — Telehealth: Payer: Self-pay | Admitting: Cardiology

## 2018-03-28 ENCOUNTER — Ambulatory Visit: Payer: Medicare HMO | Admitting: Cardiology

## 2018-03-28 ENCOUNTER — Encounter: Payer: Self-pay | Admitting: Cardiology

## 2018-03-28 VITALS — BP 180/76 | HR 56 | Ht 70.0 in | Wt 243.2 lb

## 2018-03-28 DIAGNOSIS — I10 Essential (primary) hypertension: Secondary | ICD-10-CM

## 2018-03-28 DIAGNOSIS — R0602 Shortness of breath: Secondary | ICD-10-CM

## 2018-03-28 DIAGNOSIS — E782 Mixed hyperlipidemia: Secondary | ICD-10-CM | POA: Diagnosis not present

## 2018-03-28 DIAGNOSIS — Z01810 Encounter for preprocedural cardiovascular examination: Secondary | ICD-10-CM | POA: Diagnosis not present

## 2018-03-28 DIAGNOSIS — I2510 Atherosclerotic heart disease of native coronary artery without angina pectoris: Secondary | ICD-10-CM | POA: Diagnosis not present

## 2018-03-28 MED ORDER — CHLORTHALIDONE 25 MG PO TABS: 25.0000 mg | ORAL_TABLET | Freq: Every day | ORAL | 3 refills | Status: DC

## 2018-03-28 NOTE — Patient Instructions (Addendum)
Medication Instructions:   Your physician has recommended you make the following change in your medication:   Stop hydroclorothiazide  Start chlorthalidone 25 mg by mouth daily.  Continue all other medications the same.  Labwork:  Your physician recommends that you return for lab work in: 2 weeks to check your BMET & Mg levels. Lab orders given to you you during visit. Take with you to [facility]. Monday-Friday from 7:30 am - 4:00 pm  Testing/Procedures: Your physician has requested that you have an echocardiogram. Echocardiography is a painless test that uses sound waves to create images of your heart. It provides your doctor with information about the size and shape of your heart and how well your heart's chambers and valves are working. This procedure takes approximately one hour. There are no restrictions for this procedure.  Follow-Up:  Your physician recommends that you schedule a follow-up appointment in: pending test results.  Any Other Special Instructions Will Be Listed Below (If Applicable).  If you need a refill on your cardiac medications before your next appointment, please call your pharmacy.

## 2018-03-28 NOTE — Progress Notes (Signed)
Clinical Summary [name] is a 84 y.o.male seen as new consult, referred by Dr Willey Blade for history of CAD and preoperative evaluation.    1. History of CAD/MI - notes metion history of MI 20 years ago with PTCA at [facility]. No records available in epic. Patient denies any recurrent ischemic events.  - no recent chest pain. Some DOE, mainly with inclines.  - sedentary lifestyle. DOE at 1-2 blocks.  - occasional LE edema   2.  HTN - compliant with meds -does not check bp at home.   3. DM2 - 02/2018 Hgb A1c 8.8  4. Hyperlipidemia - 02/2018 TC 166 TG 477 HDL 27 - compliant with statin   5. Preoperative evaluation - has been evaluated for pancreatic mass. From surgery note mass found to be precancerous and typical treatment is surgery    Past Medical History:  Diagnosis Date  . BPH (benign prostatic hypertrophy)   . Diabetes ([place])   . High cholesterol   . Hypertension   . Myocardial infarction ([place])      No Known Allergies   Current Outpatient Medications  Medication Sig Dispense Refill  . Alpha-Lipoic Acid 600 MG CAPS Take 600 mg by mouth 2 (two) times daily.     Marland Kitchen amLODipine (NORVASC) 5 MG tablet Take 5 mg by mouth daily.     Marland Kitchen aspirin 81 MG tablet Take 81 mg by mouth daily.    . hydrALAZINE (APRESOLINE) 25 MG tablet Take 25 mg by mouth 2 (two) times daily.    . hydrochlorothiazide (HYDRODIURIL) 25 MG tablet Take 25 mg by mouth daily.    . insulin aspart (NOVOLOG) 100 UNIT/ML FlexPen Inject 15 Units into the skin 2 (two) times daily.    Marland Kitchen LANTUS SOLOSTAR 100 UNIT/ML Solostar Pen Inject 45 Units into the skin 2 (two) times daily.     Marland Kitchen losartan (COZAAR) 100 MG tablet Take 100 mg by mouth daily.    . metFORMIN (GLUCOPHAGE) 500 MG tablet Take 500 mg by mouth 2 (two) times daily with a meal.     . metoprolol tartrate (LOPRESSOR) 100 MG tablet Take 100 mg by mouth 2 (two) times daily.    . ondansetron (ZOFRAN) 4 MG tablet Take 1 tablet (4 mg total) by mouth every  6 (six) hours as needed for nausea. 20 tablet 0  . simvastatin (ZOCOR) 20 MG tablet Take 20 mg by mouth daily at 6 PM.     . tamsulosin (FLOMAX) 0.4 MG CAPS capsule Take 0.4 mg by mouth every evening.     . triamcinolone cream (KENALOG) 0.1 % Apply 1 application topically daily as needed (irritation).     No current facility-administered medications for this visit.      Past Surgical History:  Procedure Laterality Date  . CHOLECYSTECTOMY  1973  . COLONOSCOPY  07/20/2006   Dr. [name]:Status post right hemicolectomy. Residual colonic mucosa appeared normal, normal rectum.   . COLONOSCOPY  2006/2007   sprawling villous adenoma at ileocecal valve  . COLONOSCOPY  2011   Dr. [name]: normal rectum, pancolonic diverticulosis, 2 diminutive polyps, with path benign polypoid colonic mucosa  . COLONOSCOPY N/A 01/02/2015   Procedure: COLONOSCOPY;  Surgeon: Daneil Dolin, MD;  [facility];  Service: Endoscopy;  Laterality: N/A;  1245  . ESOPHAGOGASTRODUODENOSCOPY (EGD) WITH PROPOFOL N/A 01/13/2018   Procedure: ESOPHAGOGASTRODUODENOSCOPY (EGD) WITH PROPOFOL;  Surgeon: [name], MD;  Location: [facility];  Service: Endoscopy;  Laterality: N/A;  .  EUS N/A 01/13/2018   Procedure: UPPER ENDOSCOPIC ULTRASOUND (EUS) RADIAL;  Surgeon: [name], MD;  Location: [facility];  Service: Endoscopy;  Laterality: N/A;  . FINE NEEDLE ASPIRATION N/A 01/13/2018   Procedure: FINE NEEDLE ASPIRATION (FNA) LINEAR;  Surgeon: [name], MD;  Location: [facility];  Service: Endoscopy;  Laterality: N/A;  . open hemicolectomy  2007   due to villous adenoma     No Known Allergies    Family History  Problem Relation Age of Onset  . Ulcers Father   . Diabetes type II Brother   . Diabetes type II Brother   . Colon cancer Neg Hx      Social History Jonathan Avery reports that he has quit smoking. His smoking use included cigars. He quit after 10.00 years of use. He has never used  smokeless tobacco. Jonathan Avery reports that he does not drink alcohol.   Review of Systems CONSTITUTIONAL: No weight loss, fever, chills, weakness or fatigue.  HEENT: Eyes: No visual loss, blurred vision, double vision or yellow sclerae.No hearing loss, sneezing, congestion, runny nose or sore throat.  SKIN: No rash or itching.  CARDIOVASCULAR: per hpi RESPIRATORY: per hpi GASTROINTESTINAL: No anorexia, nausea, vomiting or diarrhea. No abdominal pain or blood.  GENITOURINARY: No burning on urination, no polyuria NEUROLOGICAL: No headache, dizziness, syncope, paralysis, ataxia, numbness or tingling in the extremities. No change in bowel or bladder control.  MUSCULOSKELETAL: No muscle, back pain, joint pain or stiffness.  LYMPHATICS: No enlarged nodes. No history of splenectomy.  PSYCHIATRIC: No history of depression or anxiety.  ENDOCRINOLOGIC: No reports of sweating, cold or heat intolerance. No polyuria or polydipsia.  Marland Kitchen   Physical Examination Vitals:   03/28/18 1331  BP: (!) 180/76  Pulse: (!) 56  SpO2: 96%   Vitals:   03/28/18 1331  Weight: 243 lb 3.2 oz (110.3 kg)  Height: 5' 10"  (1.778 m)    Gen: resting comfortably, no acute distress HEENT: no scleral icterus, pupils equal round and reactive, no palptable cervical adenopathy,  CV: RRR, no m/r,g no jvd Resp: Clear to auscultation bilaterally GI: abdomen is soft, non-tender, non-distended, normal bowel sounds, no hepatosplenomegaly MSK: extremities are warm, 2+ bilateral pitting edema.  Skin: warm, no rash Neuro:  no focal deficits Psych: appropriate affect    Assessment and Plan  1. CAD - reported history of MI at least 20 years ago, he reports angioplasty at that time. Do not see records in [facility] system. Has not required repeat cath - EKG from pcp shows inferior Qwaves consistent with prior infarct - no recent chest pain. Chronic SOB/DOE, he also has 2+ bilateral pitting edema - obtain echo to evaluate cardiac  function, pending results would consider either stress test or if low LVEF perhaps cath  2. LE edema - significant on exam - stop HCTZ, strart chlorthalidone 80m daily. Check BMET/Mg in 2 weeks  3. HTN  - manual recheck 165/70, above goal,. Change HCTZ to chlorthalidone  4. Hyperlipidemia - consider changing to moderate dose statin (atorva 431mdaily) based on lipid guidelines and his DM2. Could consider higher dose if he tolerates based on his CAD history. Defer to pcp - high TGs, first step is further management of his DM2, last HgbA1c was 8.8.   5. Preoperative evaluation - f/u echo results. Will need an ischemic evaluation due to poor exercise capacity and DOE, noninvasive vs invasive pending echo results.     JoArnoldo LenisM.D.

## 2018-03-28 NOTE — Telephone Encounter (Signed)
°  Precert needed for: ECHO   Location: [facility]    Date: Apr 07, 2018

## 2018-04-07 ENCOUNTER — Ambulatory Visit (INDEPENDENT_AMBULATORY_CARE_PROVIDER_SITE_OTHER): Payer: Medicare HMO

## 2018-04-07 ENCOUNTER — Other Ambulatory Visit: Payer: Self-pay

## 2018-04-07 DIAGNOSIS — R0602 Shortness of breath: Secondary | ICD-10-CM

## 2018-04-11 ENCOUNTER — Telehealth: Payer: Self-pay | Admitting: *Deleted

## 2018-04-11 ENCOUNTER — Other Ambulatory Visit (HOSPITAL_COMMUNITY)
Admission: RE | Admit: 2018-04-11 | Discharge: 2018-04-11 | Disposition: A | Discharge status: Home / Self Care | Payer: Medicare HMO | Source: Ambulatory Visit | Attending: Cardiology | Admitting: Cardiology

## 2018-04-11 DIAGNOSIS — R0602 Shortness of breath: Secondary | ICD-10-CM | POA: Insufficient documentation

## 2018-04-11 DIAGNOSIS — I10 Essential (primary) hypertension: Secondary | ICD-10-CM | POA: Insufficient documentation

## 2018-04-11 LAB — BASIC METABOLIC PANEL
Anion gap: 9 (ref 5–15)
BUN: 28 mg/dL — AB (ref 8–23)
CHLORIDE: 106 mmol/L (ref 98–111)
CO2: 21 mmol/L — AB (ref 22–32)
Calcium: 9.1 mg/dL (ref 8.9–10.3)
Creatinine, Ser: 1.31 mg/dL — ABNORMAL HIGH (ref 0.61–1.24)
GFR calc Af Amer: 56 mL/min — ABNORMAL LOW (ref >60)
GFR calc non Af Amer: 48 mL/min — ABNORMAL LOW (ref >60)
Glucose, Bld: 213 mg/dL — ABNORMAL HIGH (ref 70–99)
Potassium: 4 mmol/L (ref 3.5–5.1)
Sodium: 136 mmol/L (ref 135–145)

## 2018-04-11 LAB — MAGNESIUM: Magnesium: 1.4 mg/dL — ABNORMAL LOW (ref 1.7–2.4)

## 2018-04-11 NOTE — Telephone Encounter (Signed)
-----   Message from Arnoldo Lenis, MD sent at 04/11/2018 10:33 AM EST ----- Echo shows normal heart squeezing function. Heart muscle is a little stiffer than normal, this is common with aging and can cause some SOB and swelling. Hopefully the strong fluid pill will help get some of the fluid off. He needs a lexiscan for SOB and CAD, hold norvasc day of  Zandra Abts MD

## 2018-04-11 NOTE — Telephone Encounter (Signed)
Pt aware and agreeable to lexiscan - voiced understanding of amlodipine and diabetic medication - orders placed and will forward to schedulers

## 2018-04-12 ENCOUNTER — Telehealth: Payer: Self-pay | Admitting: Cardiology

## 2018-04-12 ENCOUNTER — Telehealth: Payer: Self-pay | Admitting: *Deleted

## 2018-04-12 NOTE — Telephone Encounter (Signed)
Pre-cert Verification for the following procedure   Lexiscan scheduled for 04-18-2018 at [facility]

## 2018-04-12 NOTE — Telephone Encounter (Signed)
-----   Message from Arnoldo Lenis, MD sent at 04/12/2018  3:28 PM EST ----- Low magnesium, can he take magnesium oxide 444m bid x 4 days and then once daily after   JZandra AbtsMD

## 2018-04-12 NOTE — Telephone Encounter (Signed)
Pt voiced understanding - updated medication list  - routed to pcp

## 2018-04-18 ENCOUNTER — Encounter (HOSPITAL_COMMUNITY)
Admission: RE | Admit: 2018-04-18 | Discharge: 2018-04-18 | Disposition: A | Discharge status: Home / Self Care | Payer: Medicare HMO | Source: Ambulatory Visit | Attending: Cardiology | Admitting: Cardiology

## 2018-04-18 ENCOUNTER — Encounter (HOSPITAL_BASED_OUTPATIENT_CLINIC_OR_DEPARTMENT_OTHER)
Admission: RE | Admit: 2018-04-18 | Discharge: 2018-04-18 | Disposition: A | Discharge status: Home / Self Care | Payer: Medicare HMO | Source: Ambulatory Visit | Attending: Cardiology | Admitting: Cardiology

## 2018-04-18 DIAGNOSIS — R0602 Shortness of breath: Secondary | ICD-10-CM

## 2018-04-18 LAB — NM MYOCAR MULTI W/SPECT W/WALL MOTION / EF
CHL CUP NUCLEAR SRS: 8
CHL CUP NUCLEAR SSS: 15
LV sys vol: 107 mL
LVDIAVOL: 184 mL (ref 62–150)
Peak HR: 65 {beats}/min
RATE: 0.36
Rest HR: 51 {beats}/min
SDS: 8
TID: 1.05

## 2018-04-18 MED ORDER — SODIUM CHLORIDE 0.9% FLUSH
INTRAVENOUS | Status: AC
  Administered 2018-04-18: 10 mL via INTRAVENOUS
  Filled 2018-04-18: qty 10

## 2018-04-18 MED ORDER — REGADENOSON 0.4 MG/5ML IV SOLN
INTRAVENOUS | Status: AC
  Administered 2018-04-18: 0.4 mg via INTRAVENOUS
  Filled 2018-04-18: qty 5

## 2018-04-18 MED ORDER — TECHNETIUM TC 99M TETROFOSMIN IV KIT
30.0000 | PACK | Freq: Once | INTRAVENOUS | Status: AC | PRN
  Administered 2018-04-18: 30.5 via INTRAVENOUS

## 2018-04-18 MED ORDER — TECHNETIUM TC 99M TETROFOSMIN IV KIT
10.0000 | PACK | Freq: Once | INTRAVENOUS | Status: AC | PRN
  Administered 2018-04-18: 9.85 via INTRAVENOUS

## 2018-04-19 ENCOUNTER — Telehealth: Payer: Self-pay | Admitting: *Deleted

## 2018-04-19 NOTE — Telephone Encounter (Signed)
Pt aware and voiced understanding - requested we fax this note to [facility] in [place] - also routed to pcp

## 2018-04-19 NOTE — Telephone Encounter (Signed)
-----   Message from Erma Heritage, [place] sent at 04/18/2018  5:12 PM EST ----- Please let the patient know his stress test showed evidence of prior infarction (known history of CAD by review of Dr. Nelly Laurence last note) but no evidence of ischemia or potential blockage. Ejection fraction read as being low at 42% but recent echocardiogram showed this was preserved at 55-60% which is the ideal test to assess for this. No indication for further ischemic evaluation at this time. Please forward a copy of his results to Asencion Noble, MD.  By review of the chart, this was performed for pre-operative clearance in regards to an upcoming surgery for a pancreatic mass. He is of acceptable risk to proceed without the indication for further cardiac testing. Please forward this to the requesting surgeon or if a clearance form has been faxed over, please give to myself in the interim or to [name] once he returns.

## 2018-04-29 ENCOUNTER — Other Ambulatory Visit (HOSPITAL_COMMUNITY): Payer: Self-pay | Admitting: General Surgery

## 2018-04-29 DIAGNOSIS — K8689 Other specified diseases of pancreas: Secondary | ICD-10-CM

## 2018-05-05 ENCOUNTER — Ambulatory Visit (HOSPITAL_COMMUNITY)
Admission: RE | Admit: 2018-05-05 | Discharge: 2018-05-05 | Disposition: A | Discharge status: Home / Self Care | Payer: Medicare HMO | Source: Ambulatory Visit | Attending: General Surgery | Admitting: General Surgery

## 2018-05-05 ENCOUNTER — Other Ambulatory Visit (HOSPITAL_COMMUNITY): Payer: Self-pay | Admitting: General Surgery

## 2018-05-05 DIAGNOSIS — N281 Cyst of kidney, acquired: Secondary | ICD-10-CM | POA: Insufficient documentation

## 2018-05-05 DIAGNOSIS — R932 Abnormal findings on diagnostic imaging of liver and biliary tract: Secondary | ICD-10-CM | POA: Diagnosis not present

## 2018-05-05 DIAGNOSIS — K8689 Other specified diseases of pancreas: Secondary | ICD-10-CM | POA: Insufficient documentation

## 2018-05-05 MED ORDER — GADOBUTROL 1 MMOL/ML IV SOLN
10.0000 mL | Freq: Once | INTRAVENOUS | Status: AC | PRN
  Administered 2018-05-05: 10 mL via INTRAVENOUS

## 2018-05-11 NOTE — Progress Notes (Signed)
Please let patient know mass is unchanged.

## 2018-05-26 ENCOUNTER — Ambulatory Visit (INDEPENDENT_AMBULATORY_CARE_PROVIDER_SITE_OTHER): Payer: Medicare HMO | Admitting: Otolaryngology

## 2018-06-07 DIAGNOSIS — I2510 Atherosclerotic heart disease of native coronary artery without angina pectoris: Secondary | ICD-10-CM | POA: Diagnosis not present

## 2018-06-07 DIAGNOSIS — I10 Essential (primary) hypertension: Secondary | ICD-10-CM | POA: Diagnosis not present

## 2018-06-07 DIAGNOSIS — Z79899 Other long term (current) drug therapy: Secondary | ICD-10-CM | POA: Diagnosis not present

## 2018-06-07 DIAGNOSIS — E785 Hyperlipidemia, unspecified: Secondary | ICD-10-CM | POA: Diagnosis not present

## 2018-06-07 DIAGNOSIS — E1129 Type 2 diabetes mellitus with other diabetic kidney complication: Secondary | ICD-10-CM | POA: Diagnosis not present

## 2018-06-14 DIAGNOSIS — E1129 Type 2 diabetes mellitus with other diabetic kidney complication: Secondary | ICD-10-CM | POA: Diagnosis not present

## 2018-06-14 DIAGNOSIS — D017 Carcinoma in situ of other specified digestive organs: Secondary | ICD-10-CM | POA: Diagnosis not present

## 2018-06-14 DIAGNOSIS — I10 Essential (primary) hypertension: Secondary | ICD-10-CM | POA: Diagnosis not present

## 2018-07-11 ENCOUNTER — Other Ambulatory Visit (HOSPITAL_COMMUNITY): Payer: Self-pay | Admitting: Internal Medicine

## 2018-07-11 ENCOUNTER — Ambulatory Visit (HOSPITAL_COMMUNITY)
Admission: RE | Admit: 2018-07-11 | Discharge: 2018-07-11 | Disposition: A | Discharge status: Home / Self Care | Payer: Medicare HMO | Source: Ambulatory Visit | Attending: Internal Medicine | Admitting: Internal Medicine

## 2018-07-11 DIAGNOSIS — K56600 Partial intestinal obstruction, unspecified as to cause: Secondary | ICD-10-CM | POA: Diagnosis not present

## 2018-07-11 DIAGNOSIS — R1033 Periumbilical pain: Secondary | ICD-10-CM | POA: Diagnosis not present

## 2018-07-11 DIAGNOSIS — R112 Nausea with vomiting, unspecified: Secondary | ICD-10-CM

## 2018-07-11 DIAGNOSIS — K56609 Unspecified intestinal obstruction, unspecified as to partial versus complete obstruction: Secondary | ICD-10-CM | POA: Diagnosis not present

## 2018-09-14 DIAGNOSIS — E1129 Type 2 diabetes mellitus with other diabetic kidney complication: Secondary | ICD-10-CM | POA: Diagnosis not present

## 2018-09-16 ENCOUNTER — Other Ambulatory Visit: Payer: Self-pay

## 2018-09-16 ENCOUNTER — Encounter: Payer: Self-pay | Admitting: Internal Medicine

## 2018-09-16 ENCOUNTER — Ambulatory Visit (INDEPENDENT_AMBULATORY_CARE_PROVIDER_SITE_OTHER): Payer: Medicare HMO | Admitting: Internal Medicine

## 2018-09-16 ENCOUNTER — Encounter

## 2018-09-16 DIAGNOSIS — Z86010 Personal history of colonic polyps: Secondary | ICD-10-CM | POA: Diagnosis not present

## 2018-09-16 NOTE — Progress Notes (Signed)
Referring Provider:  Primary Care Physician:  Asencion Noble, MD  Primary GI:   Virtual Visit via Telephone Note Due to COVID-19, visit is conducted virtually and was requested by patient.   I connected with Jonathan Avery on 09/16/18 at 10:30 AM EDT by telephone and verified that I am speaking with the correct person using two identifiers.   I discussed the limitations, risks, security and privacy concerns of performing an evaluation and management service by telephone and the availability of in person appointments. I also discussed with the patient that there may be a patient responsible charge related to this service. The patient expressed understanding and agreed to proceed.  Chief Complaint  Patient presents with  . Colonoscopy    received recall letter last year for TCS; doing ok  . 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  Status post EUS/FNA and subsequent MRI.  Followed by Dr. Barry Dienes in [place]. He got a recall letter for colonoscopy.  History of a couple of adenomas removed from his colon some 6 years ago.  He is not having any lower GI complaints at this time.    Past Medical History:  Diagnosis Date  . BPH (benign prostatic hypertrophy)   . Diabetes ([place])   . High cholesterol   . Hypertension   . Myocardial infarction [facility])      Past Surgical History:  Procedure Laterality Date  . CHOLECYSTECTOMY  1973  . COLONOSCOPY  07/20/2006   Dr. Emila Steinhauser:Status post right hemicolectomy. Residual colonic mucosa appeared normal, normal rectum.   . COLONOSCOPY  2006/2007   sprawling villous adenoma at ileocecal valve  . COLONOSCOPY  2011   Dr. [name]: normal rectum, pancolonic diverticulosis, 2 diminutive polyps, with path benign polypoid colonic mucosa  . COLONOSCOPY N/A 01/02/2015   Procedure: COLONOSCOPY;  Surgeon: Daneil Dolin, MD;  [facility];  Service: Endoscopy;  Laterality: N/A;  1245  . ESOPHAGOGASTRODUODENOSCOPY (EGD) WITH PROPOFOL N/A 01/13/2018   Procedure: ESOPHAGOGASTRODUODENOSCOPY (EGD) WITH PROPOFOL;  Surgeon: [name], MD;  Location: [facility];  Service: Endoscopy;  Laterality: N/A;  . EUS N/A 01/13/2018   Procedure: UPPER ENDOSCOPIC ULTRASOUND (EUS) RADIAL;  Surgeon: [name], MD;  Location: [facility];  Service: Endoscopy;  Laterality: N/A;  . FINE NEEDLE ASPIRATION N/A 01/13/2018   Procedure: FINE NEEDLE ASPIRATION (FNA) LINEAR;  Surgeon: [name], MD;  [facility];  Service: Endoscopy;  Laterality: N/A;  . open hemicolectomy  2007   due to villous adenoma     Current Meds  Medication Sig  . amLODipine (NORVASC) 5 MG tablet Take 5 mg by mouth daily.   Marland Kitchen aspirin 81 MG tablet Take 81 mg by mouth daily.  . chlorthalidone (HYGROTON) 25 MG tablet Take 1 tablet (25 mg total) by mouth daily.  . hydrALAZINE (APRESOLINE) 25 MG tablet Take 25 mg by mouth 2 (two) times daily.  . insulin aspart (NOVOLOG) 100 UNIT/ML FlexPen Inject 15 Units into the skin 2 (two) times daily.  Marland Kitchen LANTUS SOLOSTAR 100 UNIT/ML Solostar Pen Inject 45 Units into the skin 2 (two) times daily.   Marland Kitchen losartan (COZAAR) 100 MG tablet Take 100 mg by mouth daily.  . Magnesium Oxide (MAG-OX 400 PO) Take 400 mg by mouth. Take 1 tablet twice daily for 4 days  then take 1 tablet daily  . metFORMIN (GLUCOPHAGE) 500 MG tablet Take 500 mg by mouth 2 (two) times daily with a meal.   . metoprolol tartrate (LOPRESSOR) 100 MG tablet Take 100 mg by mouth 2 (two) times daily.  . simvastatin (ZOCOR) 20 MG tablet Take 20 mg by mouth daily at 6 PM.   . tamsulosin (FLOMAX) 0.4 MG CAPS capsule Take 0.4 mg by mouth every evening.       Review of Systems: Gen: Denies fever, chills, anorexia. Denies fatigue, weakness, weight loss.  CV: Denies chest pain, palpitations, syncope, peripheral edema, and  claudication. Resp: Denies dyspnea at rest, cough, wheezing, coughing up blood, and pleurisy. GI: see HPI Derm: Denies rash, itching, dry skin Psych: Denies depression, anxiety, memory loss, confusion. No homicidal or suicidal ideation.  Heme: Denies bruising, bleeding, and enlarged lymph nodes.  Observations/Objective: No distress. Unable to perform physical exam due to telephone encounter. No video available.   Assessment and Plan: 85 year old gentleman with a history of colonic adenomas removed about 7 years ago.  Going well from standpoint of his colon.  He is now 85 years old.  I do not recommend that he have another colonoscopy unless future symptoms develop. History of mass in the tail of his pancreas.  FNA suspicious for mucinous neoplasm.  Followed by Dr. Barry Dienes.   Follow Up Instructions:   As discussed, I do not recommend another colonoscopy now or in the future unless new symptoms develop  Recommended patient follow any of Dr. Marlowe Aschoff instructions regarding follow-up MRIs, etc. of pancreatic tail mass.  I will standby to help if any new problems arise.     I discussed the assessment and treatment plan with the patient. The patient was provided an opportunity to ask questions and all were answered. The patient agreed with the plan and demonstrated an understanding of the instructions.   The patient was advised to call back or seek an in-person evaluation if the symptoms worsen or if the condition fails to improve as anticipated.  I provided 12 minutes of non-face-to-face time during this encounter.  R Garfield Cornea, MD [facility]

## 2018-09-16 NOTE — Patient Instructions (Signed)
  As discussed, I do not recommend another colonoscopy now or in the future unless new symptoms develop  Recommended patient follow any of Dr. Marlowe Aschoff instructions regarding follow-up MRIs, etc. of pancreatic tail mass.  I will standby to help if any new problems arise.

## 2018-09-21 DIAGNOSIS — E1129 Type 2 diabetes mellitus with other diabetic kidney complication: Secondary | ICD-10-CM | POA: Diagnosis not present

## 2018-09-21 DIAGNOSIS — I10 Essential (primary) hypertension: Secondary | ICD-10-CM | POA: Diagnosis not present

## 2018-12-14 DIAGNOSIS — E1129 Type 2 diabetes mellitus with other diabetic kidney complication: Secondary | ICD-10-CM | POA: Diagnosis not present

## 2018-12-21 DIAGNOSIS — I2510 Atherosclerotic heart disease of native coronary artery without angina pectoris: Secondary | ICD-10-CM | POA: Diagnosis not present

## 2018-12-21 DIAGNOSIS — E1129 Type 2 diabetes mellitus with other diabetic kidney complication: Secondary | ICD-10-CM | POA: Diagnosis not present

## 2018-12-21 DIAGNOSIS — K869 Disease of pancreas, unspecified: Secondary | ICD-10-CM | POA: Diagnosis not present

## 2018-12-21 DIAGNOSIS — I10 Essential (primary) hypertension: Secondary | ICD-10-CM | POA: Diagnosis not present

## 2019-02-20 DIAGNOSIS — Z23 Encounter for immunization: Secondary | ICD-10-CM | POA: Diagnosis not present

## 2019-03-14 DIAGNOSIS — I2510 Atherosclerotic heart disease of native coronary artery without angina pectoris: Secondary | ICD-10-CM | POA: Diagnosis not present

## 2019-03-14 DIAGNOSIS — I10 Essential (primary) hypertension: Secondary | ICD-10-CM | POA: Diagnosis not present

## 2019-03-14 DIAGNOSIS — E785 Hyperlipidemia, unspecified: Secondary | ICD-10-CM | POA: Diagnosis not present

## 2019-03-14 DIAGNOSIS — Z79899 Other long term (current) drug therapy: Secondary | ICD-10-CM | POA: Diagnosis not present

## 2019-03-14 DIAGNOSIS — E1129 Type 2 diabetes mellitus with other diabetic kidney complication: Secondary | ICD-10-CM | POA: Diagnosis not present

## 2019-03-21 DIAGNOSIS — E785 Hyperlipidemia, unspecified: Secondary | ICD-10-CM | POA: Diagnosis not present

## 2019-03-21 DIAGNOSIS — E1129 Type 2 diabetes mellitus with other diabetic kidney complication: Secondary | ICD-10-CM | POA: Diagnosis not present

## 2019-03-21 DIAGNOSIS — I10 Essential (primary) hypertension: Secondary | ICD-10-CM | POA: Diagnosis not present

## 2019-04-10 DIAGNOSIS — I10 Essential (primary) hypertension: Secondary | ICD-10-CM | POA: Diagnosis not present

## 2019-04-10 DIAGNOSIS — E1129 Type 2 diabetes mellitus with other diabetic kidney complication: Secondary | ICD-10-CM | POA: Diagnosis not present

## 2019-04-10 DIAGNOSIS — I2510 Atherosclerotic heart disease of native coronary artery without angina pectoris: Secondary | ICD-10-CM | POA: Diagnosis not present

## 2019-04-10 DIAGNOSIS — D136 Benign neoplasm of pancreas: Secondary | ICD-10-CM | POA: Diagnosis not present

## 2019-04-19 ENCOUNTER — Other Ambulatory Visit: Payer: Self-pay | Admitting: General Surgery

## 2019-04-19 ENCOUNTER — Other Ambulatory Visit (HOSPITAL_COMMUNITY): Payer: Self-pay | Admitting: General Surgery

## 2019-04-19 DIAGNOSIS — K8689 Other specified diseases of pancreas: Secondary | ICD-10-CM

## 2019-04-28 ENCOUNTER — Ambulatory Visit (HOSPITAL_COMMUNITY)
Admission: RE | Admit: 2019-04-28 | Discharge: 2019-04-28 | Disposition: A | Discharge status: Home / Self Care | Payer: Medicare HMO | Source: Ambulatory Visit | Attending: General Surgery | Admitting: General Surgery

## 2019-04-28 ENCOUNTER — Other Ambulatory Visit (HOSPITAL_COMMUNITY): Payer: Self-pay | Admitting: General Surgery

## 2019-04-28 ENCOUNTER — Other Ambulatory Visit: Payer: Self-pay

## 2019-04-28 DIAGNOSIS — K8689 Other specified diseases of pancreas: Secondary | ICD-10-CM

## 2019-04-28 DIAGNOSIS — K5730 Diverticulosis of large intestine without perforation or abscess without bleeding: Secondary | ICD-10-CM | POA: Diagnosis not present

## 2019-04-28 LAB — POCT I-STAT CREATININE: Creatinine, Ser: 1.1 mg/dL (ref 0.61–1.24)

## 2019-04-28 MED ORDER — GADOBUTROL 1 MMOL/ML IV SOLN
9.0000 mL | Freq: Once | INTRAVENOUS | Status: AC | PRN
  Administered 2019-04-28: 9 mL via INTRAVENOUS

## 2019-04-28 NOTE — Progress Notes (Signed)
Please let patient know MRI is stable and MR in 2 years is recommended.

## 2019-06-15 DIAGNOSIS — Z79899 Other long term (current) drug therapy: Secondary | ICD-10-CM | POA: Diagnosis not present

## 2019-06-15 DIAGNOSIS — I10 Essential (primary) hypertension: Secondary | ICD-10-CM | POA: Diagnosis not present

## 2019-06-15 DIAGNOSIS — E1129 Type 2 diabetes mellitus with other diabetic kidney complication: Secondary | ICD-10-CM | POA: Diagnosis not present

## 2019-06-15 DIAGNOSIS — E785 Hyperlipidemia, unspecified: Secondary | ICD-10-CM | POA: Diagnosis not present

## 2019-06-22 DIAGNOSIS — I2510 Atherosclerotic heart disease of native coronary artery without angina pectoris: Secondary | ICD-10-CM | POA: Diagnosis not present

## 2019-06-22 DIAGNOSIS — E1129 Type 2 diabetes mellitus with other diabetic kidney complication: Secondary | ICD-10-CM | POA: Diagnosis not present

## 2019-06-22 DIAGNOSIS — C259 Malignant neoplasm of pancreas, unspecified: Secondary | ICD-10-CM | POA: Diagnosis not present

## 2019-07-14 ENCOUNTER — Emergency Department (HOSPITAL_COMMUNITY)
Admission: EM | Admit: 2019-07-14 | Discharge: 2019-07-14 | Disposition: A | Discharge status: Home / Self Care | Payer: Medicare HMO | Attending: Emergency Medicine | Admitting: Emergency Medicine

## 2019-07-14 ENCOUNTER — Other Ambulatory Visit: Payer: Self-pay

## 2019-07-14 ENCOUNTER — Encounter (HOSPITAL_COMMUNITY): Payer: Self-pay

## 2019-07-14 ENCOUNTER — Emergency Department (HOSPITAL_COMMUNITY): Payer: Medicare HMO

## 2019-07-14 DIAGNOSIS — Z794 Long term (current) use of insulin: Secondary | ICD-10-CM | POA: Diagnosis not present

## 2019-07-14 DIAGNOSIS — Z87891 Personal history of nicotine dependence: Secondary | ICD-10-CM | POA: Insufficient documentation

## 2019-07-14 DIAGNOSIS — Z79899 Other long term (current) drug therapy: Secondary | ICD-10-CM | POA: Insufficient documentation

## 2019-07-14 DIAGNOSIS — E1122 Type 2 diabetes mellitus with diabetic chronic kidney disease: Secondary | ICD-10-CM | POA: Insufficient documentation

## 2019-07-14 DIAGNOSIS — M542 Cervicalgia: Secondary | ICD-10-CM | POA: Diagnosis not present

## 2019-07-14 DIAGNOSIS — N183 Chronic kidney disease, stage 3 unspecified: Secondary | ICD-10-CM | POA: Diagnosis not present

## 2019-07-14 DIAGNOSIS — Z7982 Long term (current) use of aspirin: Secondary | ICD-10-CM | POA: Insufficient documentation

## 2019-07-14 DIAGNOSIS — I129 Hypertensive chronic kidney disease with stage 1 through stage 4 chronic kidney disease, or unspecified chronic kidney disease: Secondary | ICD-10-CM | POA: Insufficient documentation

## 2019-07-14 DIAGNOSIS — I10 Essential (primary) hypertension: Secondary | ICD-10-CM | POA: Diagnosis not present

## 2019-07-14 LAB — TROPONIN I (HIGH SENSITIVITY)
Troponin I (High Sensitivity): 29 ng/L — ABNORMAL HIGH (ref <18)
Troponin I (High Sensitivity): 28 ng/L — ABNORMAL HIGH (ref <18)

## 2019-07-14 LAB — BASIC METABOLIC PANEL
Anion gap: 8 (ref 5–15)
BUN: 24 mg/dL — ABNORMAL HIGH (ref 8–23)
CO2: 25 mmol/L (ref 22–32)
Calcium: 8.8 mg/dL — ABNORMAL LOW (ref 8.9–10.3)
Chloride: 102 mmol/L (ref 98–111)
Creatinine, Ser: 1.19 mg/dL (ref 0.61–1.24)
GFR calc Af Amer: >60 mL/min (ref >60)
GFR calc non Af Amer: 55 mL/min — ABNORMAL LOW (ref >60)
Glucose, Bld: 104 mg/dL — ABNORMAL HIGH (ref 70–99)
Potassium: 4.4 mmol/L (ref 3.5–5.1)
Sodium: 135 mmol/L (ref 135–145)

## 2019-07-14 LAB — CBC WITH DIFFERENTIAL/PLATELET
Abs Immature Granulocytes: 0.04 10*3/uL (ref 0.00–0.07)
Basophils Absolute: 0 10*3/uL (ref 0.0–0.1)
Basophils Relative: 0 %
Eosinophils Absolute: 0.2 10*3/uL (ref 0.0–0.5)
Eosinophils Relative: 2 %
HCT: 38.6 % — ABNORMAL LOW (ref 39.0–52.0)
Hemoglobin: 12.6 g/dL — ABNORMAL LOW (ref 13.0–17.0)
Immature Granulocytes: 0 %
Lymphocytes Relative: 20 %
Lymphs Abs: 2 10*3/uL (ref 0.7–4.0)
MCH: 30.1 pg (ref 26.0–34.0)
MCHC: 32.6 g/dL (ref 30.0–36.0)
MCV: 92.3 fL (ref 80.0–100.0)
Monocytes Absolute: 0.7 10*3/uL (ref 0.1–1.0)
Monocytes Relative: 7 %
Neutro Abs: 7 10*3/uL (ref 1.7–7.7)
Neutrophils Relative %: 71 %
Platelets: 154 10*3/uL (ref 150–400)
RBC: 4.18 MIL/uL — ABNORMAL LOW (ref 4.22–5.81)
RDW: 12.8 % (ref 11.5–15.5)
WBC: 10 10*3/uL (ref 4.0–10.5)
nRBC: 0 % (ref 0.0–0.2)

## 2019-07-14 MED ORDER — ACETAMINOPHEN 325 MG PO TABS
650.0000 mg | ORAL_TABLET | Freq: Once | ORAL | Status: AC
  Administered 2019-07-14: 650 mg via ORAL
  Filled 2019-07-14: qty 2

## 2019-07-14 NOTE — Discharge Instructions (Addendum)
You have been seen today for neck pain. Please read and follow all provided instructions. Return to the emergency room for worsening condition or new concerning symptoms.    We did tests on your heart and there are no signs of a heart attack at this time. We think the pain in your neck more of a muscle ache.   1. Medications:  -You can take tylenol for pain. Please take as directed on the bottle. Continue usual home medications Take medications as prescribed. Please review all of the medicines and only take them if you do not have an allergy to them.   2. Treatment: rest, drink plenty of fluids  3. Follow Up:  Please follow up with primary care provider by scheduling an appointment as soon as possible for a visit to further discuss your neck pain    ?

## 2019-07-14 NOTE — ED Provider Notes (Signed)
[facility] EMERGENCY DEPARTMENT Provider Note   CSN: [id number] Arrival date & time: 07/14/19  0844     History Chief Complaint  Patient presents with  . Neck Pain    Jonathan Avery is a 86 y.o. male past medical history significant for diabetes, MI, hypertension, hyperlipidemia, CKD stage 3 presents to emergency department today with chief complaint of intermittent neck pain x2 days.  He states he first noticed the pain after waking up.  The pain was sharp and located in the back of his neck and radiated to his left shoulder.  He states the pain only lasted for seconds but was very intense.  He rates the pain 10 out of 10 in severity.  He has had 3 more episodes of similar pain since initial onset.  He denies any pain at rest and said he only notices the pain with movement.  He has not taken any medications for his symptoms prior to arrival. Denies any fever, chills, chest pain, shortness of breath, syncope, palpitations, abdominal pain, nausea, vomiting, headache, visual changes, numbness, weakness.  Has history of MI approximately 20 years ago.  History provided by patient with additional history obtained from chart review.        Past Medical History:  Diagnosis Date  . BPH (benign prostatic hypertrophy)   . Diabetes ([place])   . High cholesterol   . Hypertension   . Myocardial infarction [facility])     Patient Active Problem List   Diagnosis Date Noted  . Pancreatic mass   . Diabetes ([place]) 11/29/2017  . Essential hypertension 11/29/2017  . Dyslipidemia 11/29/2017  . Myocardial infarction ([place]) 11/29/2017  . BPH (benign prostatic hyperplasia) 11/29/2017  . SBO (small bowel obstruction) ([place]) 11/29/2017  . CKD (chronic kidney disease), stage III 11/29/2017  . Hx of colonic polyps   . History of colonic polyps 10/10/2009    Past Surgical History:  Procedure Laterality Date  . CHOLECYSTECTOMY  1973  . COLONOSCOPY  07/20/2006   Dr. [name]:Status post right hemicolectomy.  Residual colonic mucosa appeared normal, normal rectum.   . COLONOSCOPY  2006/2007   sprawling villous adenoma at ileocecal valve  . COLONOSCOPY  2011   Dr. [name]: normal rectum, pancolonic diverticulosis, 2 diminutive polyps, with path benign polypoid colonic mucosa  . COLONOSCOPY N/A 01/02/2015   Procedure: COLONOSCOPY;  Surgeon: Daneil Dolin, MD;  [facility];  Service: Endoscopy;  Laterality: N/A;  1245  . ESOPHAGOGASTRODUODENOSCOPY (EGD) WITH PROPOFOL N/A 01/13/2018   Procedure: ESOPHAGOGASTRODUODENOSCOPY (EGD) WITH PROPOFOL;  Surgeon: [name], MD;  [facility];  Service: Endoscopy;  Laterality: N/A;  . EUS N/A 01/13/2018   Procedure: UPPER ENDOSCOPIC ULTRASOUND (EUS) RADIAL;  Surgeon: [name], MD;  [facility];  Service: Endoscopy;  Laterality: N/A;  . FINE NEEDLE ASPIRATION N/A 01/13/2018   Procedure: FINE NEEDLE ASPIRATION (FNA) LINEAR;  Surgeon: [name], MD;  [facility];  Service: Endoscopy;  Laterality: N/A;  . open hemicolectomy  2007   due to villous adenoma       Family History  Problem Relation Age of Onset  . Ulcers Father   . Diabetes type II Brother   . Diabetes type II Brother   . Colon cancer Neg Hx     Social History   Tobacco Use  . Smoking status: Former Smoker    Years: 10.00    Types: Cigars  . Smokeless tobacco: Never Used  Substance Use Topics  .  Alcohol use: No    Alcohol/week: 0.0 standard drinks  . Drug use: No    Home Medications Prior to Admission medications   Medication Sig Start Date End Date Taking? Authorizing Provider  amLODipine (NORVASC) 5 MG tablet Take 5 mg by mouth daily.  11/28/14  Yes [provider]  aspirin 81 MG tablet Take 81 mg by mouth daily.   Yes [provider]  chlorthalidone (HYGROTON) 25 MG tablet Take 1 tablet (25 mg total) by mouth daily. 03/28/18 07/14/19 Yes Branch, Alphonse Guild, MD  hydrALAZINE (APRESOLINE) 25 MG tablet Take 25  mg by mouth 2 (two) times daily.   Yes [provider]  insulin aspart (NOVOLOG) 100 UNIT/ML FlexPen Inject 15 Units into the skin 2 (two) times daily.   Yes [provider]  LANTUS SOLOSTAR 100 UNIT/ML Solostar Pen Inject 45 Units into the skin 2 (two) times daily.  11/27/14  Yes [provider]  losartan (COZAAR) 100 MG tablet Take 100 mg by mouth daily.   Yes [provider]  metFORMIN (GLUCOPHAGE) 1000 MG tablet Take 1,000 mg by mouth 2 (two) times daily. 02/02/19  Yes [provider]  metoprolol tartrate (LOPRESSOR) 100 MG tablet Take 100 mg by mouth 2 (two) times daily.   Yes [provider]  simvastatin (ZOCOR) 20 MG tablet Take 20 mg by mouth daily at 6 PM.  11/27/14  Yes [provider]  Magnesium Oxide (MAG-OX 400 PO) Take 400 mg by mouth. Take 1 tablet twice daily for 4 days then take 1 tablet daily    [provider]  ondansetron (ZOFRAN) 4 MG tablet Take 1 tablet (4 mg total) by mouth every 6 (six) hours as needed for nausea. Patient not taking: Reported on 09/16/2018 11/30/17   Heath Lark D, DO  tamsulosin (FLOMAX) 0.4 MG CAPS capsule Take 0.4 mg by mouth every evening.  11/05/14   [provider]  triamcinolone cream (KENALOG) 0.1 % Apply 1 application topically daily as needed (irritation).    [provider]    Allergies    Patient has no known allergies.  Review of Systems   Review of Systems  All other systems are reviewed and are negative for acute change except as noted in the HPI.   Physical Exam Updated Vital Signs BP (!) 198/86 (BP Location: Left Arm)   Pulse 62   Temp 97.9 F (36.6 C) (Oral)   Resp 20   SpO2 96%   Physical Exam Vitals and nursing note reviewed.  Constitutional:      General: He is not in acute distress.    Appearance: He is not ill-appearing.  HENT:     Head: Normocephalic and atraumatic.     Jaw: There is normal jaw occlusion.     Right Ear: Tympanic  membrane and external ear normal.     Left Ear: Tympanic membrane and external ear normal.     Nose: Nose normal.     Mouth/Throat:     Mouth: Mucous membranes are moist.     Pharynx: Oropharynx is clear.  Eyes:     General: No scleral icterus.       Right eye: No discharge.        Left eye: No discharge.     Extraocular Movements: Extraocular movements intact.     Conjunctiva/sclera: Conjunctivae normal.     Pupils: Pupils are equal, round, and reactive to light.  Neck:     Vascular: No JVD.  Comments: Full ROM intact without spinous process TTP. No bony stepoffs or deformities, mild left paraspinous muscle TTP, muscle spasms. No rigidity or meningeal signs. No bruising, erythema, or swelling.  Cardiovascular:     Rate and Rhythm: Normal rate and regular rhythm.     Pulses: Normal pulses.          Radial pulses are 2+ on the right side and 2+ on the left side.     Heart sounds: Normal heart sounds.  Pulmonary:     Comments: Lungs clear to auscultation in all fields. Symmetric chest rise. No wheezing, rales, or rhonchi. Abdominal:     Comments: Abdomen is soft, non-distended, and non-tender in all quadrants. No rigidity, no guarding. No peritoneal signs.  Musculoskeletal:        General: Normal range of motion.     Left shoulder: Normal.     Left elbow: Normal.     Cervical back: Normal range of motion. No crepitus. No spinous process tenderness.     Comments: Sensation intact to bilateral upper extremities. Strong and equal grip strength in bilateral upper extremities.   No thoracic, or lumbar spinal tenderness to palpation. No paraspinal tenderness. No step offs, crepitus or deformity palpated.    Skin:    General: Skin is warm and dry.     Capillary Refill: Capillary refill takes less than 2 seconds.  Neurological:     Mental Status: He is oriented to person, place, and time.     GCS: GCS eye subscore is 4. GCS verbal subscore is 5. GCS motor subscore is 6.      Comments: Fluent speech, no facial droop.  Psychiatric:        Behavior: Behavior normal.     ED Results / Procedures / Treatments   Labs (all labs ordered are listed, but only abnormal results are displayed) Labs Reviewed  CBC WITH DIFFERENTIAL/PLATELET - Abnormal; Notable for the following components:      Result Value   RBC 4.18 (*)    Hemoglobin 12.6 (*)    HCT 38.6 (*)    All other components within normal limits  BASIC METABOLIC PANEL - Abnormal; Notable for the following components:   Glucose, Bld 104 (*)    BUN 24 (*)    Calcium 8.8 (*)    GFR calc non Af Amer 55 (*)    All other components within normal limits  TROPONIN I (HIGH SENSITIVITY) - Abnormal; Notable for the following components:   Troponin I (High Sensitivity) 29 (*)    All other components within normal limits  TROPONIN I (HIGH SENSITIVITY) - Abnormal; Notable for the following components:   Troponin I (High Sensitivity) 28 (*)    All other components within normal limits    EKG EKG Interpretation  Date/Time:  Friday July 14 2019 08:55:52 EST Ventricular Rate:  58 PR Interval:    QRS Duration: 119 QT Interval:  447 QTC Calculation: 439 R Axis:   30 Text Interpretation: Sinus rhythm Nonspecific intraventricular conduction delay Inferior infarct, age indeterminate Baseline wander in lead(s) II III aVF V2 V3 Confirmed by Veryl Speak [phone]) on 07/14/2019 9:09:29 AM   Radiology DG Cervical Spine Complete  Result Date: 07/14/2019 CLINICAL DATA:  Neck pain 2 days.  No injury. EXAM: CERVICAL SPINE - COMPLETE 4+ VIEW COMPARISON:  None. FINDINGS: Exam demonstrates moderate spondylosis throughout the cervical spine most prominent over the mid to lower cervical spine. Mild disc space narrowing at the C5-6 and C6-7 levels.  Vertebral body heights are normal. Subtle 1-2 mm posterior subluxation of C4 with respect to C3 and C5 with respect to C4 and C6 likely degenerative in nature. There is moderate  uncovertebral joint spurring and facet arthropathy. Atlantoaxial articulation is normal moderate right-sided neural foraminal narrowing from the C4-5 level to the C6-7 level worse at the C5-6 level. Mild left-sided neural foraminal narrowing from the C3-4 level to the C5-6 level. IMPRESSION: 1.  No acute findings. 2. Moderate spondylosis of the cervical spine with disc disease at the C5-6 and C6-7 levels. Moderate bilateral neural foraminal narrowing as described right worse than left. Electronically Signed   By: Marin Olp M.D.   On: 07/14/2019 10:05   CHEST - 2 VIEW  COMPARISON: 11/29/2017  FINDINGS: Lungs are adequately inflated and otherwise clear. Cardiomediastinal silhouette is normal. Mild degenerative change of the spine.  IMPRESSION: No active cardiopulmonary disease.   Electronically Signed By: Marin Olp M.D. On: 07/14/2019 10:07  Procedures Procedures (including critical care time)  Medications Ordered in ED Medications  acetaminophen (TYLENOL) tablet 650 mg (650 mg Oral Given 07/14/19 1154)    ED Course  I have reviewed the triage vital signs and the nursing notes.  Pertinent labs & imaging results that were available during my care of the patient were reviewed by me and considered in my medical decision making (see chart for details).    MDM Rules/Calculators/A&P                      Patient seen and examined. Patient presents awake, alert, hemodynamically stable, afebrile, non toxic.  BP on arrival was 198/86, he did not take his blood pressure medications yet this morning.  On exam he has mild tenderness to left paraspinal muscles of cervical spine.  No spasm felt.  His neck pain is reproducible with rotation of his head.  Back and left shoulder are normal.  Strong and equal grip strength in bilateral upper extremities.  He denies any chest pain, lungs are clear to auscultation in all fields.  Neck pain is thought to be musculoskeletal in nature however given  the radiation to his shoulder and cardiac risk factors, will proceed with cardiac work-up.  CBC without leukocytosis, hemoglobin 12.6 appears consistent with his baseline of 13.  BMP without severe electrolyte derangement, normal creatinine of 1.19.  First troponin 29, second troponin 28. ACS unlikely cause of neck pain. I viewed pt's chest xray and it does not suggest acute infectious processes x-ray of cervical spine shows no acute findings but radiologist does comment on moderate spondylosis of the cervical spine with disc disease at the C5-6 and C6-7 level.  The patient appears reasonably screened and/or stabilized for discharge and I doubt any other medical condition or other [facility] requiring further screening, evaluation, or treatment in the ED at this time prior to discharge. The patient is safe for discharge with strict return precautions discussed. Recommend pcp follow up if symptoms persist. The patient was discussed with and seen by Dr. Stark Jock who agrees with the treatment plan.   Portions of this note were generated with [employer]. Dictation errors may occur despite best attempts at proofreading.   Final Clinical Impression(s) / ED Diagnoses Final diagnoses:  Neck pain    Rx / DC Orders ED Discharge Orders    None       Cherre Robins, PA-C 07/14/19 1212    Veryl Speak, MD 07/14/19 1552

## 2019-07-14 NOTE — ED Triage Notes (Signed)
Pt presents to ED with complaints of neck pain x 2 days. Pt states he woke up 2 days ago with pain in his neck then started in both shoulders. Pt denies chest pain.

## 2019-08-31 DIAGNOSIS — E1129 Type 2 diabetes mellitus with other diabetic kidney complication: Secondary | ICD-10-CM | POA: Diagnosis not present

## 2019-10-12 DIAGNOSIS — Z08 Encounter for follow-up examination after completed treatment for malignant neoplasm: Secondary | ICD-10-CM | POA: Diagnosis not present

## 2019-10-12 DIAGNOSIS — Z85828 Personal history of other malignant neoplasm of skin: Secondary | ICD-10-CM | POA: Diagnosis not present

## 2019-10-12 DIAGNOSIS — X32XXXD Exposure to sunlight, subsequent encounter: Secondary | ICD-10-CM | POA: Diagnosis not present

## 2019-10-12 DIAGNOSIS — L570 Actinic keratosis: Secondary | ICD-10-CM | POA: Diagnosis not present

## 2019-10-13 DIAGNOSIS — E1129 Type 2 diabetes mellitus with other diabetic kidney complication: Secondary | ICD-10-CM | POA: Diagnosis not present

## 2019-10-20 DIAGNOSIS — I10 Essential (primary) hypertension: Secondary | ICD-10-CM | POA: Diagnosis not present

## 2019-10-20 DIAGNOSIS — R0989 Other specified symptoms and signs involving the circulatory and respiratory systems: Secondary | ICD-10-CM | POA: Diagnosis not present

## 2019-10-20 DIAGNOSIS — E1129 Type 2 diabetes mellitus with other diabetic kidney complication: Secondary | ICD-10-CM | POA: Diagnosis not present

## 2019-10-20 DIAGNOSIS — Z6834 Body mass index (BMI) 34.0-34.9, adult: Secondary | ICD-10-CM | POA: Diagnosis not present

## 2019-10-24 ENCOUNTER — Other Ambulatory Visit (HOSPITAL_COMMUNITY): Payer: Self-pay | Admitting: Internal Medicine

## 2019-10-24 ENCOUNTER — Other Ambulatory Visit: Payer: Self-pay | Admitting: Internal Medicine

## 2019-10-24 DIAGNOSIS — R0989 Other specified symptoms and signs involving the circulatory and respiratory systems: Secondary | ICD-10-CM

## 2019-10-27 ENCOUNTER — Other Ambulatory Visit: Payer: Self-pay

## 2019-10-27 ENCOUNTER — Ambulatory Visit (HOSPITAL_COMMUNITY)
Admission: RE | Admit: 2019-10-27 | Discharge: 2019-10-27 | Disposition: A | Discharge status: Home / Self Care | Payer: Medicare HMO | Source: Ambulatory Visit | Attending: Internal Medicine | Admitting: Internal Medicine

## 2019-10-27 DIAGNOSIS — I6523 Occlusion and stenosis of bilateral carotid arteries: Secondary | ICD-10-CM | POA: Diagnosis not present

## 2019-10-27 DIAGNOSIS — R0989 Other specified symptoms and signs involving the circulatory and respiratory systems: Secondary | ICD-10-CM | POA: Diagnosis not present

## 2019-12-05 ENCOUNTER — Other Ambulatory Visit: Payer: Self-pay

## 2019-12-05 DIAGNOSIS — R0989 Other specified symptoms and signs involving the circulatory and respiratory systems: Secondary | ICD-10-CM

## 2019-12-12 ENCOUNTER — Ambulatory Visit (HOSPITAL_COMMUNITY)
Admission: RE | Admit: 2019-12-12 | Discharge: 2019-12-12 | Disposition: A | Discharge status: Home / Self Care | Payer: Medicare HMO | Source: Ambulatory Visit | Attending: Vascular Surgery | Admitting: Vascular Surgery

## 2019-12-12 ENCOUNTER — Encounter: Payer: Self-pay | Admitting: Vascular Surgery

## 2019-12-12 ENCOUNTER — Ambulatory Visit: Payer: Medicare HMO | Admitting: Vascular Surgery

## 2019-12-12 ENCOUNTER — Other Ambulatory Visit: Payer: Self-pay

## 2019-12-12 VITALS — BP 204/70 | HR 50 | Temp 98.2°F | Resp 20 | Ht 70.0 in | Wt 235.0 lb

## 2019-12-12 DIAGNOSIS — R0989 Other specified symptoms and signs involving the circulatory and respiratory systems: Secondary | ICD-10-CM | POA: Insufficient documentation

## 2019-12-12 DIAGNOSIS — I6523 Occlusion and stenosis of bilateral carotid arteries: Secondary | ICD-10-CM

## 2019-12-12 NOTE — Progress Notes (Signed)
Vascular and Vein Specialist of [place]  Patient name: Jonathan Avery MRN: [id number] DOB: 10-05-1930 Sex: male  REASON FOR CONSULT: Evaluation carotid disease.  HPI: Jonathan Avery is a 86 y.o. male, who is here today for evaluation of carotid disease.  He is here with his son.  He was found to have a carotid bruit and underwent duplex which revealed moderate to severe right and moderate left carotid stenosis.  He is seen today for further discussion of this.  He is here with his son.  On questioning regarding neurologic deficits.  The patient reports that over the past several months he has had 3-4 episodes of very clear-cut expressive aphasia.  He did not bring this to anyone's attention from a medical standpoint.  He specifically denies any focal weakness.  On each occasion his speech returned completely to normal.  Past Medical History:  Diagnosis Date  . BPH (benign prostatic hypertrophy)   . Carotid artery occlusion   . Diabetes ([place])   . High cholesterol   . Hypertension   . Myocardial infarction [facility])     Family History  Problem Relation Age of Onset  . Ulcers Father   . Diabetes type II Brother   . Diabetes type II Brother   . Colon cancer Neg Hx     SOCIAL HISTORY: Social History   Socioeconomic History  . Marital status: Widowed    Spouse name: Not on file  . Number of children: Not on file  . Years of education: Not on file  . Highest education level: Not on file  Occupational History  . Not on file  Tobacco Use  . Smoking status: Former Smoker    Years: 10.00    Types: Cigars  . Smokeless tobacco: Never Used  Vaping Use  . Vaping Use: Never used  Substance and Sexual Activity  . Alcohol use: No    Alcohol/week: 0.0 standard drinks  . Drug use: No  . Sexual activity: Not Currently  Other Topics Concern  . Not on file  Social History Narrative  . Not on file   Social Determinants of Health   Financial Resource  Strain:   . Difficulty of Paying Living Expenses:   Food Insecurity:   . Worried About Charity fundraiser in the Last Year:   . Arboriculturist in the Last Year:   Transportation Needs:   . Film/video editor (Medical):   Marland Kitchen Lack of Transportation (Non-Medical):   Physical Activity:   . Days of Exercise per Week:   . Minutes of Exercise per Session:   Stress:   . Feeling of Stress :   Social Connections:   . Frequency of Communication with Friends and Family:   . Frequency of Social Gatherings with Friends and Family:   . Attends Religious Services:   . Active Member of Clubs or Organizations:   . Attends Archivist Meetings:   Marland Kitchen Marital Status:   Intimate Partner Violence:   . Fear of Current or Ex-Partner:   . Emotionally Abused:   Marland Kitchen Physically Abused:   . Sexually Abused:     No Known Allergies  Current Outpatient Medications  Medication Sig Dispense Refill  . amLODipine (NORVASC) 5 MG tablet Take 5 mg by mouth daily.     Marland Kitchen aspirin 81 MG tablet Take 81 mg by mouth daily.    . hydrALAZINE (APRESOLINE) 25 MG tablet Take 25 mg by mouth 2 (two) times  daily.    . insulin aspart (NOVOLOG) 100 UNIT/ML FlexPen Inject 15 Units into the skin 2 (two) times daily.    Marland Kitchen LANTUS SOLOSTAR 100 UNIT/ML Solostar Pen Inject 45 Units into the skin 2 (two) times daily.     Marland Kitchen losartan (COZAAR) 100 MG tablet Take 100 mg by mouth daily.    . Magnesium Oxide (MAG-OX 400 PO) Take 400 mg by mouth. Take 1 tablet twice daily for 4 days then take 1 tablet daily    . metFORMIN (GLUCOPHAGE) 1000 MG tablet Take 1,000 mg by mouth 2 (two) times daily.    . metoprolol tartrate (LOPRESSOR) 100 MG tablet Take 100 mg by mouth 2 (two) times daily.    . simvastatin (ZOCOR) 20 MG tablet Take 20 mg by mouth daily at 6 PM.     . tamsulosin (FLOMAX) 0.4 MG CAPS capsule Take 0.4 mg by mouth every evening.     . triamcinolone cream (KENALOG) 0.1 % Apply 1 application topically daily as needed  (irritation).    . chlorthalidone (HYGROTON) 25 MG tablet Take 1 tablet (25 mg total) by mouth daily. 90 tablet 3  . ondansetron (ZOFRAN) 4 MG tablet Take 1 tablet (4 mg total) by mouth every 6 (six) hours as needed for nausea. (Patient not taking: Reported on 12/12/2019) 20 tablet 0   No current facility-administered medications for this visit.    REVIEW OF SYSTEMS:  [X]  denotes positive finding, [ ]  denotes negative finding Cardiac  Comments:  Chest pain or chest pressure:    Shortness of breath upon exertion: x   Short of breath when lying flat:    Irregular heart rhythm:        Vascular    Pain in calf, thigh, or hip brought on by ambulation: x   Pain in feet at night that wakes you up from your sleep:     Blood clot in your veins:    Leg swelling:  x       Pulmonary    Oxygen at home:    Productive cough:     Wheezing:         Neurologic    Sudden weakness in arms or legs:     Sudden numbness in arms or legs:     Sudden onset of difficulty speaking or slurred speech: x   Temporary loss of vision in one eye:     Problems with dizziness:         Gastrointestinal    Blood in stool:     Vomited blood:         Genitourinary    Burning when urinating:     Blood in urine:        Psychiatric    Major depression:         Hematologic    Bleeding problems:    Problems with blood clotting too easily:        Skin    Rashes or ulcers:        Constitutional    Fever or chills:      PHYSICAL EXAM: Vitals:   12/12/19 1320 12/12/19 1322  BP: (!) 212/74 (!) 204/70  Pulse: (!) 50   Resp: 20   Temp: 98.2 F (36.8 C)   SpO2: 95%   Weight: 235 lb (106.6 kg)   Height: 5' 10"  (1.778 m)     GENERAL: The patient is a well-nourished male, in no acute distress. The vital signs are documented above. CARDIOVASCULAR:  Soft right carotid bruit.  2+ radial pulses and 2+ dorsalis pedis pulses bilaterally PULMONARY: There is good air exchange  ABDOMEN: Soft and non-tender   MUSCULOSKELETAL: There are no major deformities or cyanosis. NEUROLOGIC: No focal weakness or paresthesias are detected. SKIN: There are no ulcers or rashes noted. PSYCHIATRIC: The patient has a normal affect.  DATA:  I reviewed carotid duplex from [facility].  We did repeat his right carotid duplex showing 60 to 79% stenosis.  Duplex of any Penn suggested 50 to 60% stenosis on the left  MEDICAL ISSUES: Long discussion with the patient and his son.  Explained his expressive aphasia certainly seems like several episodes of very clear-cut transient ischemic attacks.  I have recommended CT angiogram for further evaluation.  Explained that his right carotid was below the threshold where we would recommend surgery for asymptomatic disease.  Certainly if he has moderate to severe stenosis in the left carotid we may suggest left endarterectomy for potentially symptomatic lesion.  We will obtain CT scan and discuss this with him further   [name], MD [facility] Vascular and Vein Specialists of [facility] Tel [phone] Pager [phone]

## 2019-12-12 NOTE — H&P (View-Only) (Signed)
Vascular and Vein Specialist of [place]  Patient name: Jonathan Avery MRN: [id number] DOB: 1930/08/21 Sex: male  REASON FOR CONSULT: Evaluation carotid disease.  HPI: Jonathan Avery is a 86 y.o. male, who is here today for evaluation of carotid disease.  He is here with his son.  He was found to have a carotid bruit and underwent duplex which revealed moderate to severe right and moderate left carotid stenosis.  He is seen today for further discussion of this.  He is here with his son.  On questioning regarding neurologic deficits.  The patient reports that over the past several months he has had 3-4 episodes of very clear-cut expressive aphasia.  He did not bring this to anyone's attention from a medical standpoint.  He specifically denies any focal weakness.  On each occasion his speech returned completely to normal.  Past Medical History:  Diagnosis Date  . BPH (benign prostatic hypertrophy)   . Carotid artery occlusion   . Diabetes ([place])   . High cholesterol   . Hypertension   . Myocardial infarction [facility])     Family History  Problem Relation Age of Onset  . Ulcers Father   . Diabetes type II Brother   . Diabetes type II Brother   . Colon cancer Neg Hx     SOCIAL HISTORY: Social History   Socioeconomic History  . Marital status: Widowed    Spouse name: Not on file  . Number of children: Not on file  . Years of education: Not on file  . Highest education level: Not on file  Occupational History  . Not on file  Tobacco Use  . Smoking status: Former Smoker    Years: 10.00    Types: Cigars  . Smokeless tobacco: Never Used  Vaping Use  . Vaping Use: Never used  Substance and Sexual Activity  . Alcohol use: No    Alcohol/week: 0.0 standard drinks  . Drug use: No  . Sexual activity: Not Currently  Other Topics Concern  . Not on file  Social History Narrative  . Not on file   Social Determinants of Health   Financial Resource  Strain:   . Difficulty of Paying Living Expenses:   Food Insecurity:   . Worried About Charity fundraiser in the Last Year:   . Arboriculturist in the Last Year:   Transportation Needs:   . Film/video editor (Medical):   Marland Kitchen Lack of Transportation (Non-Medical):   Physical Activity:   . Days of Exercise per Week:   . Minutes of Exercise per Session:   Stress:   . Feeling of Stress :   Social Connections:   . Frequency of Communication with Friends and Family:   . Frequency of Social Gatherings with Friends and Family:   . Attends Religious Services:   . Active Member of Clubs or Organizations:   . Attends Archivist Meetings:   Marland Kitchen Marital Status:   Intimate Partner Violence:   . Fear of Current or Ex-Partner:   . Emotionally Abused:   Marland Kitchen Physically Abused:   . Sexually Abused:     No Known Allergies  Current Outpatient Medications  Medication Sig Dispense Refill  . amLODipine (NORVASC) 5 MG tablet Take 5 mg by mouth daily.     Marland Kitchen aspirin 81 MG tablet Take 81 mg by mouth daily.    . hydrALAZINE (APRESOLINE) 25 MG tablet Take 25 mg by mouth 2 (two) times  daily.    . insulin aspart (NOVOLOG) 100 UNIT/ML FlexPen Inject 15 Units into the skin 2 (two) times daily.    Marland Kitchen LANTUS SOLOSTAR 100 UNIT/ML Solostar Pen Inject 45 Units into the skin 2 (two) times daily.     Marland Kitchen losartan (COZAAR) 100 MG tablet Take 100 mg by mouth daily.    . Magnesium Oxide (MAG-OX 400 PO) Take 400 mg by mouth. Take 1 tablet twice daily for 4 days then take 1 tablet daily    . metFORMIN (GLUCOPHAGE) 1000 MG tablet Take 1,000 mg by mouth 2 (two) times daily.    . metoprolol tartrate (LOPRESSOR) 100 MG tablet Take 100 mg by mouth 2 (two) times daily.    . simvastatin (ZOCOR) 20 MG tablet Take 20 mg by mouth daily at 6 PM.     . tamsulosin (FLOMAX) 0.4 MG CAPS capsule Take 0.4 mg by mouth every evening.     . triamcinolone cream (KENALOG) 0.1 % Apply 1 application topically daily as needed  (irritation).    . chlorthalidone (HYGROTON) 25 MG tablet Take 1 tablet (25 mg total) by mouth daily. 90 tablet 3  . ondansetron (ZOFRAN) 4 MG tablet Take 1 tablet (4 mg total) by mouth every 6 (six) hours as needed for nausea. (Patient not taking: Reported on 12/12/2019) 20 tablet 0   No current facility-administered medications for this visit.    REVIEW OF SYSTEMS:  [X]  denotes positive finding, [ ]  denotes negative finding Cardiac  Comments:  Chest pain or chest pressure:    Shortness of breath upon exertion: x   Short of breath when lying flat:    Irregular heart rhythm:        Vascular    Pain in calf, thigh, or hip brought on by ambulation: x   Pain in feet at night that wakes you up from your sleep:     Blood clot in your veins:    Leg swelling:  x       Pulmonary    Oxygen at home:    Productive cough:     Wheezing:         Neurologic    Sudden weakness in arms or legs:     Sudden numbness in arms or legs:     Sudden onset of difficulty speaking or slurred speech: x   Temporary loss of vision in one eye:     Problems with dizziness:         Gastrointestinal    Blood in stool:     Vomited blood:         Genitourinary    Burning when urinating:     Blood in urine:        Psychiatric    Major depression:         Hematologic    Bleeding problems:    Problems with blood clotting too easily:        Skin    Rashes or ulcers:        Constitutional    Fever or chills:      PHYSICAL EXAM: Vitals:   12/12/19 1320 12/12/19 1322  BP: (!) 212/74 (!) 204/70  Pulse: (!) 50   Resp: 20   Temp: 98.2 F (36.8 C)   SpO2: 95%   Weight: 235 lb (106.6 kg)   Height: 5' 10"  (1.778 m)     GENERAL: The patient is a well-nourished male, in no acute distress. The vital signs are documented above. CARDIOVASCULAR:  Soft right carotid bruit.  2+ radial pulses and 2+ dorsalis pedis pulses bilaterally PULMONARY: There is good air exchange  ABDOMEN: Soft and non-tender   MUSCULOSKELETAL: There are no major deformities or cyanosis. NEUROLOGIC: No focal weakness or paresthesias are detected. SKIN: There are no ulcers or rashes noted. PSYCHIATRIC: The patient has a normal affect.  DATA:  I reviewed carotid duplex from [facility].  We did repeat his right carotid duplex showing 60 to 79% stenosis.  Duplex of any [facility] suggested 50 to 60% stenosis on the left  MEDICAL ISSUES: Long discussion with the patient and his son.  Explained his expressive aphasia certainly seems like several episodes of very clear-cut transient ischemic attacks.  I have recommended CT angiogram for further evaluation.  Explained that his right carotid was below the threshold where we would recommend surgery for asymptomatic disease.  Certainly if he has moderate to severe stenosis in the left carotid we may suggest left endarterectomy for potentially symptomatic lesion.  We will obtain CT scan and discuss this with him further   [name], MD [facility] Vascular and Vein Specialists of [facility] Tel [phone] Pager [phone]

## 2019-12-20 ENCOUNTER — Other Ambulatory Visit (HOSPITAL_COMMUNITY): Payer: Medicare HMO

## 2019-12-20 ENCOUNTER — Ambulatory Visit (HOSPITAL_COMMUNITY): Payer: Medicare HMO

## 2019-12-22 ENCOUNTER — Ambulatory Visit (HOSPITAL_COMMUNITY)
Admission: RE | Admit: 2019-12-22 | Discharge: 2019-12-22 | Disposition: A | Discharge status: Home / Self Care | Payer: Medicare HMO | Source: Ambulatory Visit | Attending: Vascular Surgery | Admitting: Vascular Surgery

## 2019-12-22 ENCOUNTER — Other Ambulatory Visit: Payer: Self-pay

## 2019-12-22 DIAGNOSIS — R0989 Other specified symptoms and signs involving the circulatory and respiratory systems: Secondary | ICD-10-CM | POA: Insufficient documentation

## 2019-12-22 DIAGNOSIS — I63233 Cerebral infarction due to unspecified occlusion or stenosis of bilateral carotid arteries: Secondary | ICD-10-CM | POA: Diagnosis not present

## 2019-12-22 LAB — POCT I-STAT CREATININE: Creatinine, Ser: 1.3 mg/dL — ABNORMAL HIGH (ref 0.61–1.24)

## 2019-12-22 MED ORDER — IOHEXOL 350 MG/ML SOLN
75.0000 mL | Freq: Once | INTRAVENOUS | Status: AC | PRN
  Administered 2019-12-22: 75 mL via INTRAVENOUS

## 2019-12-26 ENCOUNTER — Other Ambulatory Visit: Payer: Self-pay

## 2019-12-26 NOTE — Progress Notes (Signed)
Received call from [facility].  Confirmed with him that patient has covid test tomorrow and that someone will be in touch in the next couple of days with instructions on when to arrive for surgery.  [name] verbalized understanding.

## 2019-12-27 ENCOUNTER — Other Ambulatory Visit (HOSPITAL_COMMUNITY)
Admission: RE | Admit: 2019-12-27 | Discharge: 2019-12-27 | Disposition: A | Discharge status: Home / Self Care | Payer: Medicare HMO | Source: Ambulatory Visit | Attending: Vascular Surgery | Admitting: Vascular Surgery

## 2019-12-27 DIAGNOSIS — Z7984 Long term (current) use of oral hypoglycemic drugs: Secondary | ICD-10-CM | POA: Diagnosis not present

## 2019-12-27 DIAGNOSIS — I6521 Occlusion and stenosis of right carotid artery: Secondary | ICD-10-CM | POA: Diagnosis present

## 2019-12-27 DIAGNOSIS — Z01812 Encounter for preprocedural laboratory examination: Secondary | ICD-10-CM | POA: Insufficient documentation

## 2019-12-27 DIAGNOSIS — E1122 Type 2 diabetes mellitus with diabetic chronic kidney disease: Secondary | ICD-10-CM | POA: Diagnosis present

## 2019-12-27 DIAGNOSIS — Z87891 Personal history of nicotine dependence: Secondary | ICD-10-CM | POA: Diagnosis not present

## 2019-12-27 DIAGNOSIS — E1151 Type 2 diabetes mellitus with diabetic peripheral angiopathy without gangrene: Secondary | ICD-10-CM | POA: Diagnosis present

## 2019-12-27 DIAGNOSIS — Z8673 Personal history of transient ischemic attack (TIA), and cerebral infarction without residual deficits: Secondary | ICD-10-CM | POA: Diagnosis not present

## 2019-12-27 DIAGNOSIS — I6523 Occlusion and stenosis of bilateral carotid arteries: Secondary | ICD-10-CM | POA: Diagnosis not present

## 2019-12-27 DIAGNOSIS — Z20822 Contact with and (suspected) exposure to covid-19: Secondary | ICD-10-CM | POA: Diagnosis present

## 2019-12-27 DIAGNOSIS — N183 Chronic kidney disease, stage 3 unspecified: Secondary | ICD-10-CM | POA: Diagnosis present

## 2019-12-27 DIAGNOSIS — Z7982 Long term (current) use of aspirin: Secondary | ICD-10-CM | POA: Diagnosis not present

## 2019-12-27 DIAGNOSIS — I252 Old myocardial infarction: Secondary | ICD-10-CM | POA: Diagnosis not present

## 2019-12-27 DIAGNOSIS — I129 Hypertensive chronic kidney disease with stage 1 through stage 4 chronic kidney disease, or unspecified chronic kidney disease: Secondary | ICD-10-CM | POA: Diagnosis present

## 2019-12-27 DIAGNOSIS — Z79899 Other long term (current) drug therapy: Secondary | ICD-10-CM | POA: Diagnosis not present

## 2019-12-27 DIAGNOSIS — Z794 Long term (current) use of insulin: Secondary | ICD-10-CM | POA: Diagnosis not present

## 2019-12-27 DIAGNOSIS — E7800 Pure hypercholesterolemia, unspecified: Secondary | ICD-10-CM | POA: Diagnosis present

## 2019-12-27 LAB — SARS CORONAVIRUS 2 (TAT 6-24 HRS): SARS Coronavirus 2: NEGATIVE

## 2019-12-28 ENCOUNTER — Other Ambulatory Visit: Payer: Self-pay

## 2019-12-28 ENCOUNTER — Encounter (HOSPITAL_COMMUNITY): Payer: Self-pay | Admitting: Vascular Surgery

## 2019-12-28 NOTE — Progress Notes (Signed)
I spoke with Wynelle Fanny one of Mr. [facility] emergency contact.  Mr Eliberto Ivory reports that patient is hard of hearing, he can read lips. Mr. Eliberto Ivory said that patient's daughter, Rutherford Nail will be with patient im am and will need to be in pre- op with patient to help answer questions, due to hearing loss.  Mr.Jillson Has type II diabetes, Mr. Evalyn Casco reports that CBGs run 60- 200. I gave instructions to take 1/2 of Lantus at bedtime- 20 units, in am if CBG is greater that 70 to take 20 units of Lantus.  If  CBG is greater than 220 to take 1/2 of sliding scale Humolog In sulin.  I instructed patient to check CBG after awaking and every 2 hours until arrival  to the hospital.  I Instructed patient if CBG is less than 70 to drink 1/2 cup of a clear juice. Recheck CBG in 15 minutes then call pre- op desk at [phone] for further instructions. If scheduled to receive Insulin, do not take Insulin.

## 2019-12-29 ENCOUNTER — Encounter (HOSPITAL_COMMUNITY): Payer: Self-pay | Admitting: Vascular Surgery

## 2019-12-29 ENCOUNTER — Inpatient Hospital Stay (HOSPITAL_COMMUNITY): Payer: Medicare HMO | Admitting: Certified Registered Nurse Anesthetist

## 2019-12-29 ENCOUNTER — Inpatient Hospital Stay (HOSPITAL_COMMUNITY)
Admission: RE | Admit: 2019-12-29 | Discharge: 2019-12-30 | DRG: 039 | Disposition: A | Discharge status: Home / Self Care | Payer: Medicare HMO | Attending: Vascular Surgery | Admitting: Vascular Surgery

## 2019-12-29 ENCOUNTER — Encounter (HOSPITAL_COMMUNITY)
Admission: RE | Disposition: A | Discharge status: Home / Self Care | Payer: Self-pay | Source: Home / Self Care | Attending: Vascular Surgery

## 2019-12-29 DIAGNOSIS — N183 Chronic kidney disease, stage 3 unspecified: Secondary | ICD-10-CM | POA: Diagnosis present

## 2019-12-29 DIAGNOSIS — Z79899 Other long term (current) drug therapy: Secondary | ICD-10-CM

## 2019-12-29 DIAGNOSIS — Z7982 Long term (current) use of aspirin: Secondary | ICD-10-CM | POA: Diagnosis not present

## 2019-12-29 DIAGNOSIS — Z8673 Personal history of transient ischemic attack (TIA), and cerebral infarction without residual deficits: Secondary | ICD-10-CM | POA: Diagnosis not present

## 2019-12-29 DIAGNOSIS — E7800 Pure hypercholesterolemia, unspecified: Secondary | ICD-10-CM | POA: Diagnosis present

## 2019-12-29 DIAGNOSIS — Z20822 Contact with and (suspected) exposure to covid-19: Secondary | ICD-10-CM | POA: Diagnosis present

## 2019-12-29 DIAGNOSIS — Z794 Long term (current) use of insulin: Secondary | ICD-10-CM

## 2019-12-29 DIAGNOSIS — E1122 Type 2 diabetes mellitus with diabetic chronic kidney disease: Secondary | ICD-10-CM | POA: Diagnosis present

## 2019-12-29 DIAGNOSIS — I6521 Occlusion and stenosis of right carotid artery: Secondary | ICD-10-CM | POA: Diagnosis present

## 2019-12-29 DIAGNOSIS — I252 Old myocardial infarction: Secondary | ICD-10-CM

## 2019-12-29 DIAGNOSIS — Z87891 Personal history of nicotine dependence: Secondary | ICD-10-CM | POA: Diagnosis not present

## 2019-12-29 DIAGNOSIS — E1151 Type 2 diabetes mellitus with diabetic peripheral angiopathy without gangrene: Secondary | ICD-10-CM | POA: Diagnosis present

## 2019-12-29 DIAGNOSIS — I129 Hypertensive chronic kidney disease with stage 1 through stage 4 chronic kidney disease, or unspecified chronic kidney disease: Secondary | ICD-10-CM | POA: Diagnosis present

## 2019-12-29 HISTORY — DX: Cerebral infarction, unspecified: I63.9

## 2019-12-29 HISTORY — PX: ENDARTERECTOMY: SHX5162

## 2019-12-29 HISTORY — PX: PATCH ANGIOPLASTY: SHX6230

## 2019-12-29 HISTORY — DX: Malignant (primary) neoplasm, unspecified: C80.1

## 2019-12-29 HISTORY — DX: Peripheral vascular disease, unspecified: I73.9

## 2019-12-29 LAB — SURGICAL PCR SCREEN
MRSA, PCR: NEGATIVE
Staphylococcus aureus: NEGATIVE

## 2019-12-29 LAB — CBC
HCT: 43.2 % (ref 39.0–52.0)
Hemoglobin: 14.1 g/dL (ref 13.0–17.0)
MCH: 29.5 pg (ref 26.0–34.0)
MCHC: 32.6 g/dL (ref 30.0–36.0)
MCV: 90.4 fL (ref 80.0–100.0)
Platelets: 169 10*3/uL (ref 150–400)
RBC: 4.78 MIL/uL (ref 4.22–5.81)
RDW: 12.9 % (ref 11.5–15.5)
WBC: 11.4 10*3/uL — ABNORMAL HIGH (ref 4.0–10.5)
nRBC: 0 % (ref 0.0–0.2)

## 2019-12-29 LAB — COMPREHENSIVE METABOLIC PANEL
ALT: 15 U/L (ref 0–44)
AST: 20 U/L (ref 15–41)
Albumin: 3.2 g/dL — ABNORMAL LOW (ref 3.5–5.0)
Alkaline Phosphatase: 42 U/L (ref 38–126)
Anion gap: 12 (ref 5–15)
BUN: 23 mg/dL (ref 8–23)
CO2: 21 mmol/L — ABNORMAL LOW (ref 22–32)
Calcium: 9.1 mg/dL (ref 8.9–10.3)
Chloride: 104 mmol/L (ref 98–111)
Creatinine, Ser: 1.36 mg/dL — ABNORMAL HIGH (ref 0.61–1.24)
GFR calc Af Amer: 54 mL/min — ABNORMAL LOW (ref >60)
GFR calc non Af Amer: 47 mL/min — ABNORMAL LOW (ref >60)
Glucose, Bld: 140 mg/dL — ABNORMAL HIGH (ref 70–99)
Potassium: 4.4 mmol/L (ref 3.5–5.1)
Sodium: 137 mmol/L (ref 135–145)
Total Bilirubin: 0.6 mg/dL (ref 0.3–1.2)
Total Protein: 6.2 g/dL — ABNORMAL LOW (ref 6.5–8.1)

## 2019-12-29 LAB — TYPE AND SCREEN
ABO/RH(D): A POS
Antibody Screen: NEGATIVE

## 2019-12-29 LAB — HEMOGLOBIN A1C
Hgb A1c MFr Bld: 7.7 % — ABNORMAL HIGH (ref 4.8–5.6)
Mean Plasma Glucose: 174.29 mg/dL

## 2019-12-29 LAB — GLUCOSE, CAPILLARY
Glucose-Capillary: 143 mg/dL — ABNORMAL HIGH (ref 70–99)
Glucose-Capillary: 165 mg/dL — ABNORMAL HIGH (ref 70–99)
Glucose-Capillary: 279 mg/dL — ABNORMAL HIGH (ref 70–99)

## 2019-12-29 LAB — ABO/RH: ABO/RH(D): A POS

## 2019-12-29 LAB — APTT: aPTT: 31 seconds (ref 24–36)

## 2019-12-29 LAB — PROTIME-INR
INR: 0.9 (ref 0.8–1.2)
Prothrombin Time: 12.1 seconds (ref 11.4–15.2)

## 2019-12-29 SURGERY — ENDARTERECTOMY, CAROTID
Anesthesia: General | Laterality: Right

## 2019-12-29 MED ORDER — ACETAMINOPHEN 500 MG PO TABS
ORAL_TABLET | ORAL | Status: AC
  Filled 2019-12-29: qty 2

## 2019-12-29 MED ORDER — CHLORHEXIDINE GLUCONATE CLOTH 2 % EX PADS: 6.0000 | MEDICATED_PAD | Freq: Once | CUTANEOUS | Status: DC

## 2019-12-29 MED ORDER — DEXAMETHASONE SODIUM PHOSPHATE 10 MG/ML IJ SOLN
INTRAMUSCULAR | Status: DC | PRN
  Administered 2019-12-29: 5 mg via INTRAVENOUS

## 2019-12-29 MED ORDER — LIDOCAINE 2% (20 MG/ML) 5 ML SYRINGE
INTRAMUSCULAR | Status: DC | PRN
  Administered 2019-12-29: 60 mg via INTRAVENOUS

## 2019-12-29 MED ORDER — ACETAMINOPHEN 650 MG RE SUPP: 325.0000 mg | RECTAL | Status: DC | PRN

## 2019-12-29 MED ORDER — MIDAZOLAM HCL 2 MG/2ML IJ SOLN
INTRAMUSCULAR | Status: AC
  Filled 2019-12-29: qty 2

## 2019-12-29 MED ORDER — DOCUSATE SODIUM 100 MG PO CAPS
100.0000 mg | ORAL_CAPSULE | Freq: Every day | ORAL | Status: DC
  Administered 2019-12-30: 100 mg via ORAL
  Filled 2019-12-29: qty 1

## 2019-12-29 MED ORDER — SODIUM CHLORIDE 0.9 % IV SOLN: 500.0000 mL | Freq: Once | INTRAVENOUS | Status: DC | PRN

## 2019-12-29 MED ORDER — SODIUM CHLORIDE 0.9 % IV SOLN: INTRAVENOUS | Status: DC

## 2019-12-29 MED ORDER — ORAL CARE MOUTH RINSE: 15.0000 mL | Freq: Once | OROMUCOSAL | Status: AC

## 2019-12-29 MED ORDER — HEPARIN SODIUM (PORCINE) 1000 UNIT/ML IJ SOLN
INTRAMUSCULAR | Status: DC | PRN
  Administered 2019-12-29: 10000 [IU] via INTRAVENOUS

## 2019-12-29 MED ORDER — PROPOFOL 10 MG/ML IV BOLUS
INTRAVENOUS | Status: AC
  Filled 2019-12-29: qty 20

## 2019-12-29 MED ORDER — SODIUM CHLORIDE 0.9 % IV SOLN
0.0125 ug/kg/min | INTRAVENOUS | Status: DC
  Filled 2019-12-29: qty 2000

## 2019-12-29 MED ORDER — DIPHENHYDRAMINE HCL 25 MG PO CAPS
25.0000 mg | ORAL_CAPSULE | Freq: Every evening | ORAL | Status: DC | PRN
  Administered 2019-12-30: 25 mg via ORAL
  Filled 2019-12-29: qty 1

## 2019-12-29 MED ORDER — LACTATED RINGERS IV SOLN: INTRAVENOUS | Status: DC | PRN

## 2019-12-29 MED ORDER — ALUM & MAG HYDROXIDE-SIMETH 200-200-20 MG/5ML PO SUSP: 15.0000 mL | ORAL | Status: DC | PRN

## 2019-12-29 MED ORDER — EPHEDRINE SULFATE-NACL 50-0.9 MG/10ML-% IV SOSY
PREFILLED_SYRINGE | INTRAVENOUS | Status: DC | PRN
  Administered 2019-12-29 (×2): 10 mg via INTRAVENOUS
  Administered 2019-12-29 (×3): 5 mg via INTRAVENOUS

## 2019-12-29 MED ORDER — TAMSULOSIN HCL 0.4 MG PO CAPS
0.4000 mg | ORAL_CAPSULE | Freq: Every evening | ORAL | Status: DC
  Administered 2019-12-29: 0.4 mg via ORAL
  Filled 2019-12-29: qty 1

## 2019-12-29 MED ORDER — PANTOPRAZOLE SODIUM 40 MG PO TBEC
40.0000 mg | DELAYED_RELEASE_TABLET | Freq: Every day | ORAL | Status: DC
  Administered 2019-12-29 – 2019-12-30 (×2): 40 mg via ORAL
  Filled 2019-12-29 (×2): qty 1

## 2019-12-29 MED ORDER — AMLODIPINE BESYLATE 5 MG PO TABS
5.0000 mg | ORAL_TABLET | Freq: Every day | ORAL | Status: DC
  Administered 2019-12-30: 5 mg via ORAL
  Filled 2019-12-29: qty 1

## 2019-12-29 MED ORDER — PHENOL 1.4 % MT LIQD: 1.0000 | OROMUCOSAL | Status: DC | PRN

## 2019-12-29 MED ORDER — GLYCOPYRROLATE PF 0.2 MG/ML IJ SOSY
PREFILLED_SYRINGE | INTRAMUSCULAR | Status: DC | PRN
Start: 2019-12-29 — End: 2019-12-29
  Administered 2019-12-29 (×2): .1 mg via INTRAVENOUS

## 2019-12-29 MED ORDER — CHLORHEXIDINE GLUCONATE 0.12 % MT SOLN
15.0000 mL | Freq: Once | OROMUCOSAL | Status: AC
  Administered 2019-12-29: 15 mL via OROMUCOSAL
  Filled 2019-12-29: qty 15

## 2019-12-29 MED ORDER — CEFAZOLIN SODIUM-DEXTROSE 2-4 GM/100ML-% IV SOLN
2.0000 g | INTRAVENOUS | Status: AC
  Administered 2019-12-29: 2 g via INTRAVENOUS
  Filled 2019-12-29: qty 100

## 2019-12-29 MED ORDER — CLEVIDIPINE BUTYRATE 0.5 MG/ML IV EMUL
INTRAVENOUS | Status: DC | PRN
Start: 2019-12-29 — End: 2019-12-29
  Administered 2019-12-29: 2 mg/h via INTRAVENOUS

## 2019-12-29 MED ORDER — SIMVASTATIN 20 MG PO TABS
20.0000 mg | ORAL_TABLET | Freq: Every day | ORAL | Status: DC
  Administered 2019-12-29: 20 mg via ORAL
  Filled 2019-12-29: qty 1

## 2019-12-29 MED ORDER — DEXAMETHASONE SODIUM PHOSPHATE 10 MG/ML IJ SOLN
INTRAMUSCULAR | Status: AC
  Filled 2019-12-29: qty 1

## 2019-12-29 MED ORDER — METOPROLOL TARTRATE 100 MG PO TABS
100.0000 mg | ORAL_TABLET | Freq: Two times a day (BID) | ORAL | Status: DC
  Administered 2019-12-30: 100 mg via ORAL
  Filled 2019-12-29: qty 1

## 2019-12-29 MED ORDER — ONDANSETRON HCL 4 MG/2ML IJ SOLN: 4.0000 mg | Freq: Four times a day (QID) | INTRAMUSCULAR | Status: DC | PRN

## 2019-12-29 MED ORDER — SUGAMMADEX SODIUM 200 MG/2ML IV SOLN
INTRAVENOUS | Status: DC | PRN
  Administered 2019-12-29: 250 mg via INTRAVENOUS

## 2019-12-29 MED ORDER — SODIUM CHLORIDE 0.9 % IV SOLN
0.0125 ug/kg/min | INTRAVENOUS | Status: AC
  Administered 2019-12-29: .3 ug/kg/min via INTRAVENOUS
  Filled 2019-12-29: qty 2000

## 2019-12-29 MED ORDER — CHLORTHALIDONE 25 MG PO TABS
25.0000 mg | ORAL_TABLET | Freq: Every day | ORAL | Status: DC
  Administered 2019-12-30: 25 mg via ORAL
  Filled 2019-12-29: qty 1

## 2019-12-29 MED ORDER — ACETAMINOPHEN 325 MG PO TABS: 325.0000 mg | ORAL_TABLET | ORAL | Status: DC | PRN

## 2019-12-29 MED ORDER — ONDANSETRON HCL 4 MG/2ML IJ SOLN
INTRAMUSCULAR | Status: AC
  Filled 2019-12-29: qty 2

## 2019-12-29 MED ORDER — SODIUM CHLORIDE 0.9 % IV SOLN
INTRAVENOUS | Status: DC | PRN
  Administered 2019-12-29: 11:00:00 500 mL

## 2019-12-29 MED ORDER — METFORMIN HCL 500 MG PO TABS: 1000.0000 mg | ORAL_TABLET | Freq: Two times a day (BID) | ORAL | Status: DC

## 2019-12-29 MED ORDER — SODIUM CHLORIDE 0.9 % IV SOLN
INTRAVENOUS | Status: AC
  Filled 2019-12-29: qty 1.2

## 2019-12-29 MED ORDER — GUAIFENESIN-DM 100-10 MG/5ML PO SYRP: 15.0000 mL | ORAL_SOLUTION | ORAL | Status: DC | PRN

## 2019-12-29 MED ORDER — METOPROLOL TARTRATE 5 MG/5ML IV SOLN: 2.0000 mg | INTRAVENOUS | Status: DC | PRN

## 2019-12-29 MED ORDER — ONDANSETRON HCL 4 MG/2ML IJ SOLN
INTRAMUSCULAR | Status: DC | PRN
  Administered 2019-12-29: 4 mg via INTRAVENOUS

## 2019-12-29 MED ORDER — POTASSIUM CHLORIDE CRYS ER 20 MEQ PO TBCR: 20.0000 meq | EXTENDED_RELEASE_TABLET | Freq: Every day | ORAL | Status: DC | PRN

## 2019-12-29 MED ORDER — ASPIRIN EC 81 MG PO TBEC: 81.0000 mg | DELAYED_RELEASE_TABLET | Freq: Every day | ORAL | Status: DC

## 2019-12-29 MED ORDER — INSULIN ASPART 100 UNIT/ML ~~LOC~~ SOLN
0.0000 [IU] | Freq: Three times a day (TID) | SUBCUTANEOUS | Status: DC
  Administered 2019-12-30 (×2): 8 [IU] via SUBCUTANEOUS

## 2019-12-29 MED ORDER — POLYETHYLENE GLYCOL 3350 17 G PO PACK: 17.0000 g | PACK | Freq: Every day | ORAL | Status: DC | PRN

## 2019-12-29 MED ORDER — BISACODYL 10 MG RE SUPP: 10.0000 mg | Freq: Every day | RECTAL | Status: DC | PRN

## 2019-12-29 MED ORDER — HYDRALAZINE HCL 20 MG/ML IJ SOLN
INTRAMUSCULAR | Status: AC
  Filled 2019-12-29: qty 1

## 2019-12-29 MED ORDER — DIPHENHYDRAMINE-APAP (SLEEP) 25-500 MG PO TABS: 1.0000 | ORAL_TABLET | Freq: Every evening | ORAL | Status: DC | PRN

## 2019-12-29 MED ORDER — MORPHINE SULFATE (PF) 2 MG/ML IV SOLN: 2.0000 mg | INTRAVENOUS | Status: DC | PRN

## 2019-12-29 MED ORDER — ACETAMINOPHEN 500 MG PO TABS
1000.0000 mg | ORAL_TABLET | Freq: Once | ORAL | Status: AC
  Administered 2019-12-29: 1000 mg via ORAL

## 2019-12-29 MED ORDER — LABETALOL HCL 5 MG/ML IV SOLN: 10.0000 mg | INTRAVENOUS | Status: DC | PRN

## 2019-12-29 MED ORDER — LOSARTAN POTASSIUM 50 MG PO TABS
100.0000 mg | ORAL_TABLET | Freq: Every day | ORAL | Status: DC
  Administered 2019-12-29: 100 mg via ORAL
  Filled 2019-12-29: qty 2

## 2019-12-29 MED ORDER — PHENYLEPHRINE HCL-NACL 10-0.9 MG/250ML-% IV SOLN
INTRAVENOUS | Status: DC | PRN
Start: 2019-12-29 — End: 2019-12-29
  Administered 2019-12-29: 90 ug/min via INTRAVENOUS
  Administered 2019-12-29: 50 ug/min via INTRAVENOUS

## 2019-12-29 MED ORDER — HYDRALAZINE HCL 20 MG/ML IJ SOLN
5.0000 mg | INTRAMUSCULAR | Status: AC | PRN
  Administered 2019-12-29 (×2): 5 mg via INTRAVENOUS

## 2019-12-29 MED ORDER — LACTATED RINGERS IV SOLN: INTRAVENOUS | Status: DC

## 2019-12-29 MED ORDER — OXYCODONE-ACETAMINOPHEN 5-325 MG PO TABS
1.0000 | ORAL_TABLET | ORAL | Status: DC | PRN
  Administered 2019-12-29: 1 via ORAL
  Filled 2019-12-29: qty 1

## 2019-12-29 MED ORDER — HYDROMORPHONE HCL 1 MG/ML IJ SOLN: 0.2500 mg | INTRAMUSCULAR | Status: DC | PRN

## 2019-12-29 MED ORDER — ACETAMINOPHEN 500 MG PO TABS: 500.0000 mg | ORAL_TABLET | Freq: Every evening | ORAL | Status: DC | PRN

## 2019-12-29 MED ORDER — PROPOFOL 10 MG/ML IV BOLUS
INTRAVENOUS | Status: DC | PRN
  Administered 2019-12-29: 140 mg via INTRAVENOUS

## 2019-12-29 MED ORDER — PROTAMINE SULFATE 10 MG/ML IV SOLN
INTRAVENOUS | Status: DC | PRN
  Administered 2019-12-29: 50 mg via INTRAVENOUS

## 2019-12-29 MED ORDER — ROCURONIUM BROMIDE 100 MG/10ML IV SOLN
INTRAVENOUS | Status: DC | PRN
  Administered 2019-12-29: 60 mg via INTRAVENOUS

## 2019-12-29 MED ORDER — FENTANYL CITRATE (PF) 250 MCG/5ML IJ SOLN
INTRAMUSCULAR | Status: AC
  Filled 2019-12-29: qty 5

## 2019-12-29 MED ORDER — CEFAZOLIN SODIUM-DEXTROSE 2-4 GM/100ML-% IV SOLN
2.0000 g | Freq: Three times a day (TID) | INTRAVENOUS | Status: AC
  Administered 2019-12-29 – 2019-12-30 (×2): 2 g via INTRAVENOUS
  Filled 2019-12-29 (×2): qty 100

## 2019-12-29 MED ORDER — MAGNESIUM SULFATE 2 GM/50ML IV SOLN: 2.0000 g | Freq: Every day | INTRAVENOUS | Status: DC | PRN

## 2019-12-29 SURGICAL SUPPLY — 46 items
ADH SKN CLS APL DERMABOND .7 (GAUZE/BANDAGES/DRESSINGS) ×1
ADH SKN CLS LQ APL DERMABOND (GAUZE/BANDAGES/DRESSINGS) ×1
CANISTER SUCT 3000ML PPV (MISCELLANEOUS) ×3 IMPLANT
CANNULA VESSEL 3MM 2 BLNT TIP (CANNULA) ×6 IMPLANT
CATH ROBINSON RED A/P 18FR (CATHETERS) ×3 IMPLANT
CLIP LIGATING EXTRA MED SLVR (CLIP) ×3 IMPLANT
CLIP LIGATING EXTRA SM BLUE (MISCELLANEOUS) ×3 IMPLANT
COVER WAND RF STERILE (DRAPES) ×3 IMPLANT
DECANTER SPIKE VIAL GLASS SM (MISCELLANEOUS) IMPLANT
DERMABOND ADHESIVE PROPEN (GAUZE/BANDAGES/DRESSINGS) ×2
DERMABOND ADVANCED (GAUZE/BANDAGES/DRESSINGS) ×2
DERMABOND ADVANCED .7 DNX12 (GAUZE/BANDAGES/DRESSINGS) ×1 IMPLANT
DERMABOND ADVANCED .7 DNX6 (GAUZE/BANDAGES/DRESSINGS) IMPLANT
DRAIN HEMOVAC 1/8 X 5 (WOUND CARE) IMPLANT
ELECT REM PT RETURN 9FT ADLT (ELECTROSURGICAL) ×3
ELECTRODE REM PT RTRN 9FT ADLT (ELECTROSURGICAL) ×1 IMPLANT
EVACUATOR SILICONE 100CC (DRAIN) IMPLANT
GLOVE BIOGEL PI IND STRL 7.0 (GLOVE) IMPLANT
GLOVE BIOGEL PI INDICATOR 7.0 (GLOVE) ×2
GLOVE SS BIOGEL STRL SZ 7.5 (GLOVE) ×1 IMPLANT
GLOVE SUPERSENSE BIOGEL SZ 7.5 (GLOVE) ×2
GLOVE SURG SS PI 6.5 STRL IVOR (GLOVE) ×2 IMPLANT
GOWN STRL REUS W/ TWL LRG LVL3 (GOWN DISPOSABLE) ×3 IMPLANT
GOWN STRL REUS W/TWL LRG LVL3 (GOWN DISPOSABLE) ×12
KIT BASIN OR (CUSTOM PROCEDURE TRAY) ×3 IMPLANT
KIT SHUNT ARGYLE CAROTID ART 6 (VASCULAR PRODUCTS) IMPLANT
KIT TURNOVER KIT B (KITS) ×3 IMPLANT
NEEDLE 22X1 1/2 (OR ONLY) (NEEDLE) IMPLANT
NS IRRIG 1000ML POUR BTL (IV SOLUTION) ×6 IMPLANT
PACK CAROTID (CUSTOM PROCEDURE TRAY) ×3 IMPLANT
PAD ARMBOARD 7.5X6 YLW CONV (MISCELLANEOUS) ×6 IMPLANT
PATCH HEMASHIELD 8X75 (Vascular Products) ×2 IMPLANT
POSITIONER HEAD DONUT 9IN (MISCELLANEOUS) ×3 IMPLANT
SHUNT CAROTID BYPASS 10 (VASCULAR PRODUCTS) ×2 IMPLANT
SHUNT CAROTID BYPASS 12FRX15.5 (VASCULAR PRODUCTS) IMPLANT
SUT ETHILON 3 0 PS 1 (SUTURE) IMPLANT
SUT PROLENE 6 0 CC (SUTURE) ×7 IMPLANT
SUT SILK 3 0 (SUTURE)
SUT SILK 3-0 18XBRD TIE 12 (SUTURE) IMPLANT
SUT VIC AB 3-0 SH 27 (SUTURE) ×6
SUT VIC AB 3-0 SH 27X BRD (SUTURE) ×2 IMPLANT
SUT VICRYL 4-0 PS2 18IN ABS (SUTURE) ×3 IMPLANT
SYR CONTROL 10ML LL (SYRINGE) IMPLANT
SYR TOOMEY 50ML (SYRINGE) ×2 IMPLANT
TOWEL GREEN STERILE (TOWEL DISPOSABLE) ×3 IMPLANT
WATER STERILE IRR 1000ML POUR (IV SOLUTION) ×3 IMPLANT

## 2019-12-29 NOTE — Progress Notes (Signed)
  Day of Surgery Note    Subjective:  No complaints in recovery   Vitals:   12/29/19 1330 12/29/19 1345  BP: (!) 131/57 (!) 137/50  Pulse: (!) 49   Resp: 15   Temp:    SpO2: 93%     Incisions:   Clean and dry with mild fullness compared to the left Extremities:  Moving all extremities equally Lungs:  Non labored Neuro:  Tongue is midline   Assessment/Plan:  This is a 86 y.o. male who is s/p  Right carotid endarterectomy   -pt doing well in recovery.  He does have some mild fullness around the incision compared with the left but this is mild.   -to 4 east later today -anticipate possible discharge tomorrow    Leontine Locket, PA-C 12/29/2019 3:17 PM [phone]

## 2019-12-29 NOTE — Anesthesia Procedure Notes (Signed)
Arterial Line Insertion Start/End8/10/2019 9:00 AM Performed by: Janace Litten, CRNA, CRNA  Preanesthetic checklist: patient identified, IV checked, risks and benefits discussed, surgical consent and monitors and equipment checked Lidocaine 1% used for infiltration Left, radial was placed Catheter size: 20 G Hand hygiene performed  and maximum sterile barriers used  Allen's test indicative of satisfactory collateral circulation Attempts: 1 Procedure performed without using ultrasound guided technique. Following insertion, Biopatch and dressing applied. Post procedure assessment: normal  Patient tolerated the procedure well with no immediate complications.

## 2019-12-29 NOTE — Anesthesia Postprocedure Evaluation (Signed)
Anesthesia Post Note  Patient: Jonathan Avery  Procedure(s) Performed: ENDARTERECTOMY CAROTID (Right ) RIGHT CAROTID PATCH ANGIOPLASTY USING HEMASHIELD PLATINUM FINESSE (Right )     Patient location during evaluation: PACU Anesthesia Type: General Level of consciousness: awake and alert Pain management: pain level controlled Vital Signs Assessment: post-procedure vital signs reviewed and stable Respiratory status: spontaneous breathing, nonlabored ventilation, respiratory function stable and patient connected to nasal cannula oxygen Cardiovascular status: blood pressure returned to baseline and stable Postop Assessment: no apparent nausea or vomiting Anesthetic complications: no   No complications documented.  Last Vitals:  Vitals:   12/29/19 1430 12/29/19 1530  BP:  (!) 146/56  Pulse: (!) 45 (!) 47  Resp: (!) 21 15  Temp:  (!) 36.3 C  SpO2: 95% 96%    Last Pain:  Vitals:   12/29/19 1430  TempSrc:   PainSc: 0-No pain                 Clevon Khader,W. EDMOND

## 2019-12-29 NOTE — Anesthesia Preprocedure Evaluation (Addendum)
Anesthesia Evaluation  Patient identified by MRN, date of birth, ID band Patient awake    Reviewed: Allergy & Precautions, H&P , NPO status , Patient's Chart, lab work & pertinent test results, reviewed documented beta blocker date and time   Airway Mallampati: III  TM Distance: >3 FB Neck ROM: Full    Dental no notable dental hx. (+) Edentulous Upper, Edentulous Lower, Dental Advisory Given   Pulmonary neg pulmonary ROS, former smoker,    Pulmonary exam normal breath sounds clear to auscultation       Cardiovascular hypertension, Pt. on medications and Pt. on home beta blockers + Peripheral Vascular Disease   Rhythm:Regular Rate:Normal     Neuro/Psych CVA, No Residual Symptoms negative psych ROS   GI/Hepatic negative GI ROS, Neg liver ROS,   Endo/Other  diabetes, Insulin Dependent, Oral Hypoglycemic Agents  Renal/GU Renal InsufficiencyRenal disease  negative genitourinary   Musculoskeletal   Abdominal   Peds  Hematology negative hematology ROS (+)   Anesthesia Other Findings   Reproductive/Obstetrics negative OB ROS                            Anesthesia Physical Anesthesia Plan  ASA: III  Anesthesia Plan: General   Post-op Pain Management:    Induction: Intravenous  PONV Risk Score and Plan: 3 and Ondansetron, Dexamethasone and Treatment may vary due to age or medical condition  Airway Management Planned: Oral ETT  Additional Equipment: Arterial line  Intra-op Plan:   Post-operative Plan: Extubation in OR  Informed Consent: I have reviewed the patients History and Physical, chart, labs and discussed the procedure including the risks, benefits and alternatives for the proposed anesthesia with the patient or authorized representative who has indicated his/her understanding and acceptance.     Dental advisory given  Plan Discussed with: CRNA  Anesthesia Plan Comments:          Anesthesia Quick Evaluation

## 2019-12-29 NOTE — Transfer of Care (Signed)
Immediate Anesthesia Transfer of Care Note  Patient: Jonathan Avery  Procedure(s) Performed: ENDARTERECTOMY CAROTID (Right ) RIGHT CAROTID PATCH ANGIOPLASTY USING HEMASHIELD PLATINUM FINESSE (Right )  Patient Location: PACU  Anesthesia Type:General  Level of Consciousness: awake, drowsy and patient cooperative  Airway & Oxygen Therapy: Patient Spontanous Breathing and Patient connected to nasal cannula oxygen  Post-op Assessment: Report given to RN, Post -op Vital signs reviewed and stable and Patient moving all extremities X 4  Post vital signs: Reviewed and stable  Last Vitals:  Vitals Value Taken Time  BP 117/91 12/29/19 1251  Temp 36.4 C 12/29/19 1251  Pulse 51 12/29/19 1256  Resp 16 12/29/19 1256  SpO2 94 % 12/29/19 1256  Vitals shown include unvalidated device data. IABP 154/64.   Last Pain:  Vitals:   12/29/19 0821  TempSrc: Oral  PainSc: 0-No pain      Patients Stated Pain Goal: 3 (62/[id number])  Complications: No complications documented.

## 2019-12-29 NOTE — Interval H&P Note (Signed)
History and Physical Interval Note:  12/29/2019 9:48 AM  Jonathan Avery  has presented today for surgery, with the diagnosis of BILATERAL CAROTID ARTERY STENOSIS.  The various methods of treatment have been discussed with the patient and family. After consideration of risks, benefits and other options for treatment, the patient has consented to  Procedure(s): ENDARTERECTOMY CAROTID (Right) as a surgical intervention.  The patient's history has been reviewed, patient examined, no change in status, stable for surgery.  I have reviewed the patient's chart and labs.  Questions were answered to the patient's satisfaction.     Curt Jews

## 2019-12-29 NOTE — Op Note (Signed)
   OPERATIVE REPORT  DATE OF SURGERY: 12/29/2019  PATIENT: Jonathan Avery, 86 y.o. male MRN: [id number]  DOB: January 20, 1931  PRE-OPERATIVE DIAGNOSIS: Right Carotid Stenosis, Asymptomatic  POST-OPERATIVE DIAGNOSIS:  Same  PROCEDURE:  Right Carotid Endarterectomy with Dacron Patch Angioplasty  SURGEON:  Curt Jews, M.D.  PHYSICIAN ASSISTANT: Rhyne PAC  The assistant was needed for exposure and to expedite the case  ANESTHESIA:   general  EBL: Less than 200 ml  Total I/O In: 1100 [I.V.:1100] Out: -   BLOOD ADMINISTERED: none  DRAINS: none   SPECIMEN: none  COUNTS CORRECT:  YES  PLAN OF CARE: Admit to inpatient   PATIENT DISPOSITION:  PACU - hemodynamically stable and neurologically intact.  PROCEDURE DETAILS: The patient was taken to the operating room placed in supine position.  General anesthesia was administered.  The neck was prepped and draped in the usual sterile fashion.  An incision was made anterior to the sternocleidomastoid and carried down through the platysma with electrocautery.  The sternocleidomastoid was reflected posteriorly and the carotid sheath was opened.  The facial vein was ligated with 2-0 silk ties and divided.  The common carotid artery was encircled with an umbilical tape and Rummel tourniquet.  The vagus nerve was identified and preserved.  Dissection was continued onto the carotid bifurcation.  The superior thyroid artery was encircled with a 2-0 silk Potts tie.  The external carotid was encircled with a blue vessel loop and the internal carotid was encircled with an umbilical tape and Rummel tourniquet.  The hypoglossal nerve was identified and preserved.  The patient was given systemic heparin and after adequate circulation time, the internal, external and common carotid arteries were occluded with vascular clamps.  The common carotid artery was opened with an 11 blade and extended  longitudinally with Potts scissors.  The patient did have extension up  onto the internal carotid over approximately a centimeter and a half.  He had excellent backbleeding and therefore a shunt was not used.  The endarterectomy was begun on the common carotid artery and the plaque was divided proximally with Potts scissors.  The endarterectomy was continued onto the bifurcation.  The external carotid was endarterectomized with an eversion technique and the internal carotid was endarterectomized in an open fashion.  Remaining atheromatous debris was removed from the endarterectomy plane.  A Finesse Hemashield Dacron patch was brought onto the field and was sewn as a patch angioplasty with a running 6-0 Prolene suture.  Prior to completion of the closure the  usual flushing maneuvers were undertaken.  The anastomosis was completed and flow was restored first to the external and then the internal carotid artery.  Excellent flow characteristics were noted with hand-held Doppler in the internal and external carotid arteries.  The patient was given 50 mg of protamine to reverse the heparin.  The wounds were irrigated with saline.  Hemostasis was obtained with electrocautery.  The wounds were closed with 3-0 Vicryl to reapproximate the sternocleidomastoid over the carotid sheath.  The platysma was lysed with a running 3-0 Vicryl suture.  The skin was closed with a 4-0 subcuticular Vicryl stitch.  Dermabond was applied.  The patient was awakened neurologically intact in the operating room and transferred to the recovery room in stable condition   Curt Jews, M.D. 12/29/2019 12:54 PM

## 2019-12-29 NOTE — Anesthesia Procedure Notes (Signed)
Procedure Name: Intubation Date/Time: 12/29/2019 10:44 AM Performed by: Harden Mo, CRNA Pre-anesthesia Checklist: Patient identified, Emergency Drugs available, Suction available and Patient being monitored Patient Re-evaluated:Patient Re-evaluated prior to induction Oxygen Delivery Method: Circle System Utilized Preoxygenation: Pre-oxygenation with 100% oxygen Induction Type: IV induction Ventilation: Mask ventilation without difficulty Laryngoscope Size: Mac and 3 Grade View: Grade I Tube type: Oral Tube size: 7.5 mm Number of attempts: 1 Airway Equipment and Method: Stylet and Oral airway Placement Confirmation: ETT inserted through vocal cords under direct vision,  positive ETCO2 and breath sounds checked- equal and bilateral Secured at: 19 cm Tube secured with: Tape Dental Injury: Teeth and Oropharynx as per pre-operative assessment

## 2019-12-30 LAB — CBC
HCT: 34.7 % — ABNORMAL LOW (ref 39.0–52.0)
Hemoglobin: 11.6 g/dL — ABNORMAL LOW (ref 13.0–17.0)
MCH: 30 pg (ref 26.0–34.0)
MCHC: 33.4 g/dL (ref 30.0–36.0)
MCV: 89.7 fL (ref 80.0–100.0)
Platelets: 149 10*3/uL — ABNORMAL LOW (ref 150–400)
RBC: 3.87 MIL/uL — ABNORMAL LOW (ref 4.22–5.81)
RDW: 13.1 % (ref 11.5–15.5)
WBC: 10.5 10*3/uL (ref 4.0–10.5)
nRBC: 0 % (ref 0.0–0.2)

## 2019-12-30 LAB — BASIC METABOLIC PANEL
Anion gap: 9 (ref 5–15)
BUN: 29 mg/dL — ABNORMAL HIGH (ref 8–23)
CO2: 21 mmol/L — ABNORMAL LOW (ref 22–32)
Calcium: 7.7 mg/dL — ABNORMAL LOW (ref 8.9–10.3)
Chloride: 102 mmol/L (ref 98–111)
Creatinine, Ser: 1.58 mg/dL — ABNORMAL HIGH (ref 0.61–1.24)
GFR calc Af Amer: 45 mL/min — ABNORMAL LOW (ref >60)
GFR calc non Af Amer: 39 mL/min — ABNORMAL LOW (ref >60)
Glucose, Bld: 282 mg/dL — ABNORMAL HIGH (ref 70–99)
Potassium: 4.1 mmol/L (ref 3.5–5.1)
Sodium: 132 mmol/L — ABNORMAL LOW (ref 135–145)

## 2019-12-30 LAB — GLUCOSE, CAPILLARY
Glucose-Capillary: 264 mg/dL — ABNORMAL HIGH (ref 70–99)
Glucose-Capillary: 266 mg/dL — ABNORMAL HIGH (ref 70–99)

## 2019-12-30 MED ORDER — INSULIN ASPART 100 UNIT/ML ~~LOC~~ SOLN: 10.0000 [IU] | Freq: Two times a day (BID) | SUBCUTANEOUS | Status: DC

## 2019-12-30 MED ORDER — OXYCODONE HCL 5 MG PO TABS: 5.0000 mg | ORAL_TABLET | Freq: Four times a day (QID) | ORAL | 0 refills | Status: DC | PRN

## 2019-12-30 NOTE — Progress Notes (Signed)
Mobility Specialist - Progress Note   12/30/19 1326  Mobility  Activity Ambulated in hall  Level of Assistance Modified independent, requires aide device or extra time  Assistive Device Front wheel walker  Distance Ambulated (ft) 500 ft  Mobility Response Tolerated well  Mobility performed by Mobility specialist  $Mobility charge 1 Mobility    Pre-mobility: 70 HR, 160/62 BP, 94% SpO2 During mobility: 77 HR, 90% SpO2 Post-mobility: 65 HR, 95% BP, 175/59 SpO2  Pt's SpO2 initially dropped to 85% when ambulating, but it quickly rose to ~93% w/ a standing rest break and pursed lip breathing. After I instructed him to walk at a slower pace, his SpO2 stayed at 90%. Pt was asx throughout ambulation. RN notified of BP.   Pricilla Handler Mobility Specialist Mobility Specialist Phone: [phone]

## 2019-12-30 NOTE — Discharge Summary (Signed)
Discharge Summary     Jonathan Avery [birth year] 86 y.o. male  [id number]  Admission Date: 12/29/2019  Discharge Date: 12/30/2019  Physician: [name], MD  Admission Diagnosis: Asymptomatic carotid artery stenosis without infarction, right [I65.21]   HPI:   This is a 86 y.o. male who is here today for evaluation of carotid disease.  He is here with his son.  He was found to have a carotid bruit and underwent duplex which revealed moderate to severe right and moderate left carotid stenosis.  He is seen today for further discussion of this.  He is here with his son.  On questioning regarding neurologic deficits.  The patient reports that over the past several months he has had 3-4 episodes of very clear-cut expressive aphasia.  He did not bring this to anyone's attention from a medical standpoint.  He specifically denies any focal weakness.  On each occasion his speech returned completely to normal.  Hospital Course:  The patient was admitted to the hospital and taken to the operating room on 12/29/2019 and underwent right carotid endarterectomy.    The pt tolerated the procedure well and was transported to the PACU in good condition.   By POD 1, the pt neuro status was in tact.  He was swallowing without difficulty.  He was requiring 3LO2NC this was weaned successfully and pt was discharged home.   Pt did have an elevated creatinine of 1.58.  Pt's Metformin and Cozaar discontinued and pt instructed to f/u with his PCP in the next week to have his labs/meds evaluated.   The remainder of the hospital course consisted of increasing mobilization and increasing intake of solids without difficulty.   Recent Labs    12/29/19 0749 12/30/19 0641  NA 137 132*  K 4.4 4.1  CL 104 102  CO2 21* 21*  GLUCOSE 140* 282*  BUN 23 29*  CALCIUM 9.1 7.7*   Recent Labs    12/29/19 0749 12/30/19 0641  WBC 11.4* 10.5  HGB 14.1 11.6*  HCT 43.2 34.7*  PLT 169 149*   Recent Labs     12/29/19 0749  INR 0.9     Discharge Instructions    Discharge patient   Complete by: As directed    Dc pt once he has been weaned from oxygen.  Please walk with pt in the hallways and send pt home with incentive spirometer.   Discharge disposition: 01-Home or Self Care   Discharge patient date: 12/30/2019      Discharge Diagnosis:  Asymptomatic carotid artery stenosis without infarction, right [I65.21]  Secondary Diagnosis: Patient Active Problem List   Diagnosis Date Noted  . Asymptomatic carotid artery stenosis without infarction, right 12/29/2019  . Pancreatic mass   . Diabetes ([place]) 11/29/2017  . Essential hypertension 11/29/2017  . Dyslipidemia 11/29/2017  . Myocardial infarction ([place]) 11/29/2017  . BPH (benign prostatic hyperplasia) 11/29/2017  . SBO (small bowel obstruction) ([place]) 11/29/2017  . CKD (chronic kidney disease), stage III 11/29/2017  . Hx of colonic polyps   . History of colonic polyps 10/10/2009   Past Medical History:  Diagnosis Date  . BPH (benign prostatic hypertrophy)   . Cancer ([place])    skin cancer - basal  . Carotid artery occlusion   . Diabetes ([place])    Type II  . High cholesterol   . Hypertension   . Myocardial infarction ([place])   . Peripheral vascular disease ([place])   . Stroke [facility])      documented  12/28/2019- ? TIA  " weeks 3 weeks ago, expresive aphasia lasted 2 -3 minutes."     Allergies as of 12/30/2019   No Known Allergies     Medication List    STOP taking these medications   losartan 100 MG tablet Commonly known as: COZAAR   metFORMIN 1000 MG tablet Commonly known as: GLUCOPHAGE     TAKE these medications   amLODipine 5 MG tablet Commonly known as: NORVASC Take 5 mg by mouth daily.   aspirin 81 MG tablet Take 81 mg by mouth at bedtime.   chlorthalidone 25 MG tablet Commonly known as: HYGROTON Take 1 tablet (25 mg total) by mouth daily.   diphenhydramine-acetaminophen 25-500 MG Tabs tablet Commonly known as:  TYLENOL PM Take 1 tablet by mouth at bedtime as needed.   hydrALAZINE 50 MG tablet Commonly known as: APRESOLINE Take 50 mg by mouth in the morning and at bedtime. Additional 50 mg of needed it systolic blood pressure spiked to 190 or above   insulin aspart 100 UNIT/ML FlexPen Commonly known as: NOVOLOG Inject 10 Units into the skin 2 (two) times daily.   Lantus SoloStar 100 UNIT/ML Solostar Pen Generic drug: insulin glargine Inject 40 Units into the skin 2 (two) times daily.   magnesium gluconate 500 MG tablet Commonly known as: MAGONATE Take 500 mg by mouth daily.   metoprolol tartrate 100 MG tablet Commonly known as: LOPRESSOR Take 100 mg by mouth 2 (two) times daily.   oxyCODONE 5 MG immediate release tablet Commonly known as: Roxicodone Take 1 tablet (5 mg total) by mouth every 6 (six) hours as needed.   simvastatin 20 MG tablet Commonly known as: ZOCOR Take 20 mg by mouth daily at 6 PM.   tamsulosin 0.4 MG Caps capsule Commonly known as: FLOMAX Take 0.4 mg by mouth every evening.   triamcinolone cream 0.1 % Commonly known as: KENALOG Apply 1 application topically daily as needed (irritation).        [facility] Discharge Instructions Carotid Endarterectomy (CEA)  Please refer to the following instructions for your post-procedure care. Your surgeon or physician assistant will discuss any changes with you.  Activity  You are encouraged to walk as much as you can. You can slowly return to normal activities but must avoid strenuous activity and heavy lifting until your doctor tell you it's OK. Avoid activities such as vacuuming or swinging a golf club. You can drive after one week if you are comfortable and you are no longer taking prescription pain medications. It is normal to feel tired for serval weeks after your surgery. It is also normal to have difficulty with sleep habits, eating, and bowel movements after surgery. These will  go away with time.  Bathing/Showering  You may shower after you come home. Do not soak in a bathtub, hot tub, or swim until the incision heals completely.  Incision Care  Shower every day. Clean your incision with mild soap and water. Pat the area dry with a clean towel. You do not need a bandage unless otherwise instructed. Do not apply any ointments or creams to your incision. You may have skin glue on your incision. Do not peel it off. It will come off on its own in about one week. Your incision may feel thickened and raised for several weeks after your surgery. This is normal and the skin will soften over time. For Men Only: It's OK to shave around the incision but do not  shave the incision itself for 2 weeks. It is common to have numbness under your chin that could last for several months.  Diet  Resume your normal diet. There are no special food restrictions following this procedure. A low fat/low cholesterol diet is recommended for all patients with vascular disease. In order to heal from your surgery, it is CRITICAL to get adequate nutrition. Your body requires vitamins, minerals, and protein. Vegetables are the best source of vitamins and minerals. Vegetables also provide the perfect balance of protein. Processed food has little nutritional value, so try to avoid this.  Medications  Resume taking all of your medications unless your doctor or physician assistant tells you not to.  If your incision is causing pain, you may take over-the- counter pain relievers such as acetaminophen (Tylenol). If you were prescribed a stronger pain medication, please be aware these medications can cause nausea and constipation.  Prevent nausea by taking the medication with a snack or meal. Avoid constipation by drinking plenty of fluids and eating foods with a high amount of fiber, such as fruits, vegetables, and grains.  Do not take Tylenol if you are taking prescription pain medications.  YOUR RENAL  FUNCTION WAS SLIGHTLY ELEVATED.  PLEASE STOP TAKING YOUR METFORMIN AND COZAAR.  FOLLOW UP WITH DR. FAGAN IN THE NEXT WEEK TO HAVE YOUR LABS DRAWN AND MEDICATIONS EVALUATED.  Follow Up  Our office will schedule a follow up appointment 2-3 weeks following discharge.  Please call us immediately for any of the following conditions  . Increased pain, redness, drainage (pus) from your incision site. . Fever of 101 degrees or higher. . If you should develop stroke (slurred speech, difficulty swallowing, weakness on one side of your body, loss of vision) you should call 911 and go to the nearest emergency room. .  Reduce your risk of vascular disease:  . Stop smoking. If you would like help call [facility] at 1-800-QUIT-NOW 310-231-5643) or [place] at (470)287-7392. . Manage your cholesterol . Maintain a desired weight . Control your diabetes . Keep your blood pressure down .  If you have any questions, please call the office at [phone].  Prescriptions given: 1.   Roxicodone #6 No Refill  Disposition: home  Patient's condition: is Good  Follow up: 1. Dr. Donnetta Hutching in 4 weeks in [place] office. 2. Dr. Willey Blade in one week to check renal function and evaluate medications. (Metformin and Cozaar discontinued until labs re-checked).   Leontine Locket, PA-C [facility] [phone]   --- For [facility] use ---   Modified Rankin score at D/C (0-6): 0  IV medication needed for:  1. Hypertension: No 2. Hypotension: No  Post-op Complications: No  1. Post-op CVA or TIA: No  If yes: Event classification (right eye, left eye, right cortical, left cortical, verterobasilar, other): n/a  If yes: Timing of event (intra-op, <6 hrs post-op, >=6 hrs post-op, unknown): n/a  2. CN injury: No  If yes: CN n/a injuried   3. Myocardial infarction: No  If yes: Dx by (EKG or clinical, Troponin): n/a  4.  CHF: No  5.  Dysrhythmia (new): No  6. Wound infection:  No  7. Reperfusion symptoms: No  8. Return to OR: No  If yes: return to OR for (bleeding, neurologic, other CEA incision, other): n/a  Discharge medications: Statin use:  Yes ASA use:  Yes   Beta blocker use:  Yes ACE-Inhibitor use:  No  ARB use:  No CCB use: Yes  P2Y12 Antagonist use: No, [ ]  Plavix, [ ]  Plasugrel, [ ]  Ticlopinine, [ ]  Ticagrelor, [ ]  Other, [ ]  No for medical reason, [ ]  Non-compliant, [ ]  Not-indicated Anti-coagulant use:  No, [ ]  Warfarin, [ ]  Rivaroxaban, [ ]  Dabigatran,

## 2019-12-30 NOTE — Progress Notes (Signed)
Pt successfully weaned off O2.  Pt O2 at RA stable, ambulating at 90%, and 93%-96% at rest.

## 2019-12-30 NOTE — Discharge Instructions (Signed)
[facility]  Discharge Instructions   Carotid Surgery  Please refer to the following instructions for your post-procedure care. Your surgeon or physician assistant will discuss any changes with you.  Activity  You are encouraged to walk as much as you can. You can slowly return to normal activities but must avoid strenuous activity and heavy lifting until your doctor tell you it's okay. Avoid activities such as vacuuming or swinging a golf club. You can drive after one week if you are comfortable and you are no longer taking prescription pain medications. It is normal to feel tired for serval weeks after your surgery. It is also normal to have difficulty with sleep habits, eating, and bowel movements after surgery. These will go away with time.  Bathing/Showering  Shower daily after you go home. Do not soak in a bathtub, hot tub, or swim until the incision heals completely.  Incision Care  Shower every day. Clean your incision with mild soap and water. Pat the area dry with a clean towel. You do not need a bandage unless otherwise instructed. Do not apply any ointments or creams to your incision. You may have skin glue on your incision. Do not peel it off. It will come off on its own in about one week. Your incision may feel thickened and raised for several weeks after your surgery. This is normal and the skin will soften over time.   For Men Only: It's okay to shave around the incision but do not shave the incision itself for 2 weeks. It is common to have numbness under your chin that could last for several months.  Diet  Resume your normal diet. There are no special food restrictions following this procedure. A low fat/low cholesterol diet is recommended for all patients with vascular disease. In order to heal from your surgery, it is CRITICAL to get adequate nutrition. Your body requires vitamins, minerals, and protein. Vegetables are the best source of  vitamins and minerals. Vegetables also provide the perfect balance of protein. Processed food has little nutritional value, so try to avoid this.  Medications  Resume taking all of your medications unless your doctor or physician assistant tells you not to. If your incision is causing pain, you may take over-the- counter pain relievers such as acetaminophen (Tylenol). If you were prescribed a stronger pain medication, please be aware these medications can cause nausea and constipation. Prevent nausea by taking the medication with a snack or meal. Avoid constipation by drinking plenty of fluids and eating foods with a high amount of fiber, such as fruits, vegetables, and grains.  Do not take Tylenol if you are taking prescription pain medications.  YOUR RENAL FUNCTION WAS SLIGHTLY ELEVATED.  PLEASE STOP TAKING YOUR METFORMIN AND COZAAR.  FOLLOW UP WITH DR. FAGAN IN THE NEXT WEEK TO HAVE YOUR LABS DRAWN AND MEDICATIONS EVALUATED.  Follow Up  Our office will schedule a follow up appointment 2-3 weeks following discharge.  Please call us immediately for any of the following conditions  . Increased pain, redness, drainage (pus) from your incision site. . Fever of 101 degrees or higher. . If you should develop stroke (slurred speech, difficulty swallowing, weakness on one side of your body, loss of vision) you should call 911 and go to the nearest emergency room. .  Reduce your risk of vascular disease:  . Stop smoking. If you would like help call [facility] at 1-800-QUIT-NOW 5676054528) or [place] at [phone]. . Manage  your cholesterol . Maintain a desired weight . Control your diabetes . Keep your blood pressure down .  If you have any questions, please call the office at [phone].

## 2019-12-30 NOTE — Progress Notes (Signed)
Pt discharged today to home with family.  Pt's IV removed. Pt taken off telemetry and CCMD notified.  Pt left with all of their personal belongings.  AVS documentation reviewed with Pt and all questions answered.   

## 2019-12-30 NOTE — Progress Notes (Addendum)
  Progress Note    12/30/2019 8:20 AM 1 Day Post-Op  Subjective:  I feel great.  Denies any trouble swallowing.  Says his throat is a little bit sore.   Afebrile HR 50's-70's  440'N-027'O systolic 53% RA  Gtts:  None  Vitals:   12/29/19 2001 12/30/19 0743  BP: (!) 150/59 (!) 161/71  Pulse: 62 74  Resp: 19 18  Temp: 98.3 F (36.8 C) 98.5 F (36.9 C)  SpO2: 95% 94%     Physical Exam: Neuro:  In tact; tongue is midline Lungs:  Non labored Incision:  Clean and dry with mild fullness and some ecchymosis.   CBC    Component Value Date/Time   WBC 10.5 12/30/2019 0641   RBC 3.87 (L) 12/30/2019 0641   HGB 11.6 (L) 12/30/2019 0641   HCT 34.7 (L) 12/30/2019 0641   PLT 149 (L) 12/30/2019 0641   MCV 89.7 12/30/2019 0641   MCH 30.0 12/30/2019 0641   MCHC 33.4 12/30/2019 0641   RDW 13.1 12/30/2019 0641   LYMPHSABS 2.0 07/14/2019 0951   MONOABS 0.7 07/14/2019 0951   EOSABS 0.2 07/14/2019 0951   BASOSABS 0.0 07/14/2019 0951    BMET    Component Value Date/Time   NA 132 (L) 12/30/2019 0641   K 4.1 12/30/2019 0641   CL 102 12/30/2019 0641   CO2 21 (L) 12/30/2019 0641   GLUCOSE 282 (H) 12/30/2019 0641   BUN 29 (H) 12/30/2019 0641   CREATININE 1.58 (H) 12/30/2019 0641   CALCIUM 7.7 (L) 12/30/2019 0641   GFRNONAA 39 (L) 12/30/2019 0641   GFRAA 45 (L) 12/30/2019 0641     Intake/Output Summary (Last 24 hours) at 12/30/2019 0820 Last data filed at 12/30/2019 0600 Gross per 24 hour  Intake 1540 ml  Output 75 ml  Net 1465 ml     Assessment/Plan:  This is a 86 y.o. male who is s/p right CEA 1 Day Post-Op  -pt is doing well this am. -pt neuro exam is in tact -creatinine is up this am at 1.58.  Will stop his Metformin and ARB and have him f/u next week with his PCP to check labs and evaluate his meds. -pt has not ambulated -pt has voided -f/u with Dr. Donnetta Hutching in 4 weeks in the [place] office.    Leontine Locket, PA-C [facility] [phone]  Addendum:  RN reports pt on 3LO2NC and desats when weaning.  Pt not on oxygen at home.  Needs to walk and will get pt an incentive spirometer.    Leontine Locket, [facility] 12/30/2019 9:23 AM  I agree with the above.  I have seen and evaluated the patient.  He is postoperative day #1 from a right carotid endarterectomy.  He is neurologically intact.  He is currently on 3 L nasal cannula.  We will try to wean his oxygen throughout the day.  Hopefully he can go home later this afternoon  Annamarie Major

## 2020-01-01 ENCOUNTER — Encounter (HOSPITAL_COMMUNITY): Payer: Self-pay | Admitting: Vascular Surgery

## 2020-01-04 DIAGNOSIS — I10 Essential (primary) hypertension: Secondary | ICD-10-CM | POA: Diagnosis not present

## 2020-01-04 DIAGNOSIS — Z9862 Peripheral vascular angioplasty status: Secondary | ICD-10-CM | POA: Diagnosis not present

## 2020-01-25 DIAGNOSIS — I2510 Atherosclerotic heart disease of native coronary artery without angina pectoris: Secondary | ICD-10-CM | POA: Diagnosis not present

## 2020-01-25 DIAGNOSIS — I10 Essential (primary) hypertension: Secondary | ICD-10-CM | POA: Diagnosis not present

## 2020-01-25 DIAGNOSIS — E1129 Type 2 diabetes mellitus with other diabetic kidney complication: Secondary | ICD-10-CM | POA: Diagnosis not present

## 2020-01-25 DIAGNOSIS — Z79899 Other long term (current) drug therapy: Secondary | ICD-10-CM | POA: Diagnosis not present

## 2020-02-01 DIAGNOSIS — E1122 Type 2 diabetes mellitus with diabetic chronic kidney disease: Secondary | ICD-10-CM | POA: Diagnosis not present

## 2020-02-01 DIAGNOSIS — I10 Essential (primary) hypertension: Secondary | ICD-10-CM | POA: Diagnosis not present

## 2020-02-01 DIAGNOSIS — R7309 Other abnormal glucose: Secondary | ICD-10-CM | POA: Diagnosis not present

## 2020-02-01 DIAGNOSIS — N1831 Chronic kidney disease, stage 3a: Secondary | ICD-10-CM | POA: Diagnosis not present

## 2020-02-05 ENCOUNTER — Ambulatory Visit: Payer: Self-pay | Admitting: Vascular Surgery

## 2020-02-05 ENCOUNTER — Other Ambulatory Visit: Payer: Self-pay

## 2020-02-05 ENCOUNTER — Encounter: Payer: Self-pay | Admitting: Vascular Surgery

## 2020-02-05 VITALS — BP 185/68 | HR 47 | Temp 97.3°F | Resp 16 | Ht 70.0 in | Wt 240.0 lb

## 2020-02-05 DIAGNOSIS — I6523 Occlusion and stenosis of bilateral carotid arteries: Secondary | ICD-10-CM

## 2020-02-05 NOTE — Progress Notes (Signed)
Patient name: Jonathan Avery MRN: [id number] DOB: 08-02-1930 Sex: male  Seen today in our [place] office  REASON FOR VISIT: Follow-up right carotid endarterectomy from 12/29/2019  HPI: Jonathan Avery is a 86 y.o. male here today for follow-up.  He underwent uneventful endarterectomy on 12/29/2019 was discharged home on postoperative day 1.  He has had no difficulty with wound healing.  He has had no neurologic deficits.  He does report some sensation of difficulty with swallowing solids and reports that he will have to have a swallow of water if he is eating bread.  This is improving and he is not getting choked.  He does have the usual periincisional numbness  Current Outpatient Medications  Medication Sig Dispense Refill  . amLODipine (NORVASC) 5 MG tablet Take 5 mg by mouth daily.     Marland Kitchen aspirin 81 MG tablet Take 81 mg by mouth at bedtime.     . cloNIDine (CATAPRES) 0.1 MG tablet Take 0.1 mg by mouth 2 (two) times daily.    . diphenhydramine-acetaminophen (TYLENOL PM) 25-500 MG TABS tablet Take 1 tablet by mouth at bedtime as needed.    . hydrALAZINE (APRESOLINE) 50 MG tablet Take 50 mg by mouth in the morning and at bedtime. Additional 50 mg of needed it systolic blood pressure spiked to 190 or above    . insulin aspart (NOVOLOG) 100 UNIT/ML FlexPen Inject 10 Units into the skin 2 (two) times daily.     Marland Kitchen LANTUS SOLOSTAR 100 UNIT/ML Solostar Pen Inject 40 Units into the skin 2 (two) times daily.     Marland Kitchen losartan (COZAAR) 50 MG tablet Take 50 mg by mouth daily.    . magnesium gluconate (MAGONATE) 500 MG tablet Take 500 mg by mouth daily.    . metFORMIN (GLUCOPHAGE) 500 MG tablet Take 500 mg by mouth daily with breakfast.    . metoprolol tartrate (LOPRESSOR) 100 MG tablet Take 100 mg by mouth 2 (two) times daily.    . simvastatin (ZOCOR) 20 MG tablet Take 20 mg by mouth daily at 6 PM.     . tamsulosin (FLOMAX) 0.4 MG CAPS capsule Take 0.4 mg by mouth every  evening.     . triamcinolone cream (KENALOG) 0.1 % Apply 1 application topically daily as needed (irritation).    . chlorthalidone (HYGROTON) 25 MG tablet Take 1 tablet (25 mg total) by mouth daily. 90 tablet 3   No current facility-administered medications for this visit.     PHYSICAL EXAM: Vitals:   02/05/20 0946  BP: (!) 185/68  Pulse: (!) 47  Resp: 16  Temp: (!) 97.3 F (36.3 C)  TempSrc: Temporal  SpO2: 95%  Weight: 240 lb (108.9 kg)  Height: 5' 10"  (1.778 m)    GENERAL: The patient is a well-nourished male, in no acute distress. The vital signs are documented above. Right neck incision is well-healed.  No carotid bruits bilaterally. Neurologically intact  MEDICAL ISSUES: Stable overall.  He underwent right carotid endarterectomy for critical carotid stenosis.  He does report several distinct episodes of expressive aphasia approximately 4 months ago.  He has been on long-term aspirin therapy for 20 years.  His carotid duplex did not show a great deal of narrowing in the left carotid.  CTA of his neck was obtained and this does show a very focal calcified narrowing in his internal carotid artery.  The bifurcation has minimal disease.  This was felt to be approximately 60% narrowed.  Explained that it is  difficult to determine if the moderate focal narrowing in his internal carotid at any relationship to his episodes of aphasia.  I did discuss the possibility of neurology consultation.  I will discuss his case with neurology to determine if further work-up is needed or simply follow for new symptoms.  He will also have a repeat carotid duplex of his endarterectomy in 9 months.   [name], MD FACS [facility] Tel [phone] Pager [phone]

## 2020-02-06 ENCOUNTER — Telehealth: Payer: Self-pay | Admitting: Vascular Surgery

## 2020-02-06 NOTE — Telephone Encounter (Signed)
I spoke with telephone with Jonathan Avery.  In my office visit with him yesterday we had discussed his episode of expressive aphasia 3 to 4 months ago.  I discussed his case with one of the stroke neurologist.  His concern was that this could have been cardiogenic with cardiac irregularity and recommended cardiology evaluation.  He has seen [name] and Domenic Polite in the past.  I explained to Jonathan Avery that we would arrange referral for evaluation.

## 2020-02-08 ENCOUNTER — Telehealth: Payer: Self-pay | Admitting: *Deleted

## 2020-02-08 NOTE — Telephone Encounter (Signed)
-----   Message from Arnoldo Lenis, MD sent at 02/08/2020 11:28 AM EDT -----   ----- Message ----- From: [name], MD Sent: 02/06/2020   1:08 PM EDT To: Arnoldo Lenis, MD

## 2020-02-08 NOTE — Telephone Encounter (Signed)
Scheduled pt with [name], PA 10/7 @330  pm pt requested [place] office     Can we get this patient a 3 week f/u me or PA   Zandra Abts MD     Early, Arvilla Meres, MD spoke with telephone with Mr. Recore.  In my office visit with him yesterday we had discussed his episode of expressive aphasia 3 to 4 months ago.  I discussed his case with one of the stroke neurologist.  His concern was that this could have been cardiogenic with cardiac irregularity and recommended cardiology evaluation.  He has seen [name] and Domenic Polite in the past.  I explained to Mr. Aman that we would arrange referral for evaluation.

## 2020-02-08 NOTE — Telephone Encounter (Signed)
Can we get this patient a 3 week f/u me or PA   Zandra Abts MD

## 2020-02-09 ENCOUNTER — Other Ambulatory Visit: Payer: Self-pay | Admitting: *Deleted

## 2020-02-09 DIAGNOSIS — I6523 Occlusion and stenosis of bilateral carotid arteries: Secondary | ICD-10-CM

## 2020-02-22 ENCOUNTER — Telehealth: Payer: Self-pay

## 2020-02-22 NOTE — Telephone Encounter (Signed)
Patient's friend Alton Revere called to report patient had an episode of not seeing clearly involving both eyes that lasted <10 seconds. He had previous CEA with Dr. Donnetta Hutching. Per a discussion with the PA and Early's note, referred patient for an urgent neuro consult.

## 2020-02-29 ENCOUNTER — Other Ambulatory Visit: Payer: Self-pay

## 2020-02-29 ENCOUNTER — Ambulatory Visit (INDEPENDENT_AMBULATORY_CARE_PROVIDER_SITE_OTHER): Payer: Medicare HMO | Admitting: Student

## 2020-02-29 ENCOUNTER — Encounter: Payer: Self-pay | Admitting: Student

## 2020-02-29 VITALS — BP 142/88 | HR 54 | Ht 70.0 in | Wt 239.2 lb

## 2020-02-29 DIAGNOSIS — I10 Essential (primary) hypertension: Secondary | ICD-10-CM

## 2020-02-29 DIAGNOSIS — G459 Transient cerebral ischemic attack, unspecified: Secondary | ICD-10-CM

## 2020-02-29 DIAGNOSIS — I6523 Occlusion and stenosis of bilateral carotid arteries: Secondary | ICD-10-CM

## 2020-02-29 DIAGNOSIS — I2510 Atherosclerotic heart disease of native coronary artery without angina pectoris: Secondary | ICD-10-CM

## 2020-02-29 DIAGNOSIS — E782 Mixed hyperlipidemia: Secondary | ICD-10-CM

## 2020-02-29 NOTE — Progress Notes (Signed)
Cardiology Office Note    Date:  02/29/2020   ID:  Jawann, Urbani 1930/07/31, MRN [id number]  PCP:  Asencion Noble, MD  Cardiologist: Carlyle Dolly, MD    Chief Complaint  Patient presents with  . Follow-up    recent episodes of presumed TIA's and concern for a cardiac etiology    History of Present Illness:    Jonathan Avery is a 86 y.o. male with past medical history of CAD (s/p prior PTCA 20+ years ago but no records available for review, NST in 03/2018 showing evidence of prior infarct with no ischemia), HTN, HLD, and Type 2 DM who presents to the office today for evaluation of a possible cardioembolic event.   He was last examined by [name] in 03/2018 as a new patient referral for preoperative evaluation prior to surgery for a pancreatic mass. He denied any recent chest pain but did report dyspnea on exertion when walking up inclines. An echocardiogram and stress test were recommended for further evaluation. His echocardiogram showed a preserved EF of 55 to 60% with grade 1 diastolic dysfunction and no regional motion abnormalities. He did have mild mitral valve regurgitation and mild aortic regurgitation. His nuclear stress test showed a large inferior wall infarct from the apex to base with no current ischemia. He was cleared to proceed with his planned surgery. 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 Was overall stable by most recent imaging in 04/2019.  In the interim, he was referred to Vascular Surgery earlier this year for carotid artery stenosis and underwent right carotid endarterectomy with dacron patch angioplasty in 12/2019 by Dr. Donnetta Hutching. In follow-up visits, he did report episodes of expressive aphasia which were thought to be most consistent with TIA's and given his cardiac history, there was concern for a cardiogenic etiology and follow-up was recommended.  In  talking with the patient and his friend today, he reports several episodes of expressive aphasia which were occurring earlier this year and typically most notable at church when singing in the choir. The episodes would last for several seconds and then resolve. No associated vision changes or weakness at that time. He did not seek medical attention given resolution of his symptoms. He did undergo CEA as outlined above and denies any recurrent episodes of aphasia since but has experienced intermittent episodes of vision loss and was therefore referred for cardiac evaluation.  He denies any recent chest pain or dyspnea on exertion. No recent palpitations, orthopnea or PND. He does experience intermittent lower extremity edema and was previously using compression stockings but now has difficulty getting these on.  Past Medical History:  Diagnosis Date  . BPH (benign prostatic hypertrophy)   . Cancer ([place])    skin cancer - basal  . Carotid artery occlusion   . Diabetes ([place])    Type II  . High cholesterol   . Hypertension   . Myocardial infarction ([place])   . Peripheral vascular disease ([place])   . Stroke [facility])      documented 12/28/2019- ? TIA  " weeks 3 weeks ago, expresive aphasia lasted 2 -3 minutes."     Past Surgical History:  Procedure Laterality Date  . CARDIAC CATHETERIZATION    . CHOLECYSTECTOMY  1973  . COLONOSCOPY  07/20/2006   Dr. [name]:Status post right hemicolectomy. Residual colonic mucosa appeared normal, normal rectum.   . COLONOSCOPY  2006/2007  sprawling villous adenoma at ileocecal valve  . COLONOSCOPY  2011   Dr. [name]: normal rectum, pancolonic diverticulosis, 2 diminutive polyps, with path benign polypoid colonic mucosa  . COLONOSCOPY N/A 01/02/2015   Procedure: COLONOSCOPY;  Surgeon: Daneil Dolin, MD;  Location: [facility];  Service: Endoscopy;  Laterality: N/A;  1245  . CORONARY ANGIOPLASTY    . ENDARTERECTOMY Right 12/29/2019   Procedure: ENDARTERECTOMY CAROTID;   Surgeon: [name], MD;  Location: [facility] OR;  Service: Vascular;  Laterality: Right;  . ESOPHAGOGASTRODUODENOSCOPY (EGD) WITH PROPOFOL N/A 01/13/2018   Procedure: ESOPHAGOGASTRODUODENOSCOPY (EGD) WITH PROPOFOL;  Surgeon: [name], MD;  Location: [facility] ENDOSCOPY;  Service: Endoscopy;  Laterality: N/A;  . EUS N/A 01/13/2018   Procedure: UPPER ENDOSCOPIC ULTRASOUND (EUS) RADIAL;  Surgeon: [name], MD;  Location: [facility] ENDOSCOPY;  Service: Endoscopy;  Laterality: N/A;  . FINE NEEDLE ASPIRATION N/A 01/13/2018   Procedure: FINE NEEDLE ASPIRATION (FNA) LINEAR;  Surgeon: [name], MD;  Location: [facility] ENDOSCOPY;  Service: Endoscopy;  Laterality: N/A;  . open hemicolectomy  2007   due to villous adenoma  . PATCH ANGIOPLASTY Right 12/29/2019   Procedure: RIGHT CAROTID PATCH ANGIOPLASTY USING HEMASHIELD PLATINUM FINESSE;  Surgeon: [name], MD;  Location: [facility] OR;  Service: Vascular;  Laterality: Right;    Current Medications: Outpatient Medications Prior to Visit  Medication Sig Dispense Refill  . amLODipine (NORVASC) 5 MG tablet Take 5 mg by mouth daily.     Marland Kitchen aspirin 81 MG tablet Take 81 mg by mouth at bedtime.     . chlorthalidone (HYGROTON) 25 MG tablet Take 1 tablet (25 mg total) by mouth daily. 90 tablet 3  . cloNIDine (CATAPRES) 0.1 MG tablet Take 0.1 mg by mouth 2 (two) times daily.    . diphenhydramine-acetaminophen (TYLENOL PM) 25-500 MG TABS tablet Take 1 tablet by mouth at bedtime as needed.    . hydrALAZINE (APRESOLINE) 50 MG tablet Take 50 mg by mouth in the morning and at bedtime. Additional 50 mg of needed it systolic blood pressure spiked to 190 or above    . insulin aspart (NOVOLOG) 100 UNIT/ML FlexPen Inject 10 Units into the skin 2 (two) times daily.     Marland Kitchen LANTUS SOLOSTAR 100 UNIT/ML Solostar Pen Inject 40 Units into the skin 2 (two) times daily.     Marland Kitchen losartan (COZAAR) 50 MG tablet Take 50 mg by mouth daily.    . magnesium gluconate (MAGONATE) 500 MG tablet Take 500  mg by mouth daily.    . metFORMIN (GLUCOPHAGE) 500 MG tablet Take 500 mg by mouth daily with breakfast.    . metoprolol tartrate (LOPRESSOR) 100 MG tablet Take 100 mg by mouth 2 (two) times daily.    . simvastatin (ZOCOR) 20 MG tablet Take 20 mg by mouth daily at 6 PM.     . tamsulosin (FLOMAX) 0.4 MG CAPS capsule Take 0.4 mg by mouth every evening.     . triamcinolone cream (KENALOG) 0.1 % Apply 1 application topically daily as needed (irritation).     No facility-administered medications prior to visit.     Allergies:   Patient has no known allergies.   Social History   Socioeconomic History  . Marital status: Widowed    Spouse name: Not on file  . Number of children: Not on file  . Years of education: Not on file  . Highest education level: Not on file  Occupational History  . Not on file  Tobacco Use  . Smoking status: Former Smoker    Years: 10.00    Types: Cigars  . Smokeless tobacco: Never Used  Vaping Use  . Vaping Use: Never used  Substance and Sexual Activity  . Alcohol use: No    Alcohol/week: 0.0 standard drinks  . Drug use: No  . Sexual activity: Not Currently  Other Topics Concern  . Not on file  Social History Narrative  . Not on file   Social Determinants of Health   Financial Resource Strain:   . Difficulty of Paying Living Expenses: Not on file  Food Insecurity:   . Worried About Charity fundraiser in the Last Year: Not on file  . Ran Out of Food in the Last Year: Not on file  Transportation Needs:   . Lack of Transportation (Medical): Not on file  . Lack of Transportation (Non-Medical): Not on file  Physical Activity:   . Days of Exercise per Week: Not on file  . Minutes of Exercise per Session: Not on file  Stress:   . Feeling of Stress : Not on file  Social Connections:   . Frequency of Communication with Friends and Family: Not on file  . Frequency of Social Gatherings with Friends and Family: Not on file  . Attends Religious Services:  Not on file  . Active Member of Clubs or Organizations: Not on file  . Attends Archivist Meetings: Not on file  . Marital Status: Not on file     Family History:  The patient's family history includes Diabetes type II in his brother and brother; Ulcers in his father.   Review of Systems:   Please see the history of present illness.     General:  No chills, fever, night sweats or weight changes.  Cardiovascular:  No chest pain, dyspnea on exertion, edema, orthopnea, palpitations, paroxysmal nocturnal dyspnea. Dermatological: No rash, lesions/masses Respiratory: No cough, dyspnea Urologic: No hematuria, dysuria Abdominal:   No nausea, vomiting, diarrhea, bright red blood per rectum, melena, or hematemesis Neurologic:  No visual changes, wkns, changes in mental status. Positive for aphasia (resolved).   All other systems reviewed and are otherwise negative except as noted above.   Physical Exam:    VS:  BP (!) 142/88   Pulse (!) 54   Ht 5' 10"  (1.778 m)   Wt 239 lb 3.2 oz (108.5 kg)   SpO2 94%   BMI 34.32 kg/m    General: Well developed, elderly male appearing in no acute distress. Head: Normocephalic, atraumatic. Neck: CEA site appears well-healed with no erythema. JVD not elevated.  Lungs: Respirations regular and unlabored, without wheezes or rales.  Heart: Regular rhythm, bradycardiac rate. No S3 or S4.  No murmur, no rubs, or gallops appreciated. Abdomen: Appears non-distended. No obvious abdominal masses. Msk:  Strength and tone appear normal for age. No obvious joint deformities or effusions. Extremities: No clubbing or cyanosis. 1+ pitting edema up to mid-shins bilaterally.  Distal pedal pulses are 2+ bilaterally. Neuro: Alert and oriented X 3. Moves all extremities spontaneously. No focal deficits noted. Psych:  Responds to questions appropriately with a normal affect. Skin: No rashes or lesions noted  Wt Readings from Last 3 Encounters:  02/29/20 239 lb  3.2 oz (108.5 kg)  02/05/20 240 lb (108.9 kg)  12/29/19 240 lb (108.9 kg)     Studies/Labs Reviewed:   EKG:  EKG is not ordered today. EKG from 06/2019 is reviewed and shows sinus bradycardia, HR  58 with inferior infarct pattern.   Recent Labs: 12/29/2019: ALT 15 12/30/2019: BUN 29; Creatinine, Ser 1.58; Hemoglobin 11.6; Platelets 149; Potassium 4.1; Sodium 132   Lipid Panel No results found for: CHOL, TRIG, HDL, CHOLHDL, VLDL, LDLCALC, LDLDIRECT  Additional studies/ records that were reviewed today include:   NST: 03/2018  Blood pressure demonstrated a normal response to exercise.  There was no ST segment deviation noted during stress.  Findings consistent with prior myocardial infarction.  This is an intermediate risk study.  The left ventricular ejection fraction is moderately decreased (30-44%).   Large inferior wall infarct from apex to base with no ischemia EF 42%  Echocardiogram: 03/2018 Study Conclusions   - Left ventricle: The cavity size was normal. Wall thickness was  normal. Systolic function was normal. The estimated ejection  fraction was in the range of 55% to 60%. Wall motion was normal;  there were no regional wall motion abnormalities. Doppler  parameters are consistent with abnormal left ventricular  relaxation (grade 1 diastolic dysfunction).  - Aortic valve: Mildly calcified annulus. Trileaflet. There was  mild regurgitation.  - Mitral valve: Mildly calcified annulus. There was mild  regurgitation.  - Left atrium: The atrium was mildly dilated.  - Tricuspid valve: There was trivial regurgitation. Peak RV-RA  gradient (S): 29 mm Hg.  - Pulmonary arteries: Systolic pressure could not be accurately  estimated.  - Pericardium, extracardiac: There was no pericardial effusion.  Assessment:    1. Coronary artery disease involving native coronary artery of native heart without angina pectoris   2. TIA (transient ischemic attack)   3.  Bilateral carotid artery stenosis   4. Essential hypertension   5. Mixed hyperlipidemia      Plan:   In order of problems listed above:  1. CAD - He is s/p prior PTCA 20+ years ago but no records available for review. He did undergo a NST in 03/2018 which showed evidence of prior infarct with no ischemia. - No recent chest pain and his breathing has been at baseline. Continue ASA 77m daily, Simvastatin 278mdaily, and Lopressor 10071mID. He does have baseline bradycardia but denies any dizziness or presyncope. We reviewed if he developed any of those symptoms, would recommend reducing Lopressor.   2. TIA's - His episodes of expressive aphasia and vision changes are concerning for TIA's and Vascular reviewed this with Neurology who recommended to rule-out a cardiac etiology. No recurrent aphasia since CEA but he has experienced intermittent vision changes. Will arrange for a 30-day cardiac monitor to rule-out any significant arrhythmias.   3. Carotid Artery Stenosis - He is s/p right carotid endarterectomy with dacron patch angioplasty in 12/2019. Being followed by Vascular Surgery. Remains on ASA and statin therapy.   4. HTN - BP was initially elevated at 160/70, at 142/88 on recheck. He reports this has been well-controlled at home. Continue current medication regimen with Amlodipine, Chlorthalidone, Clonidine, Hydralazine, Losartan and Lopressor. We reviewed the importance of limiting sodium intake given his edema.   5. HLD - Followed by PCP. He remains on Simvastatin 52m58mily with goal LDL less than 70 with known CAD.    Medication Adjustments/Labs and Tests Ordered: Current medicines are reviewed at length with the patient today.  Concerns regarding medicines are outlined above.  Medication changes, Labs and Tests ordered today are listed in the Patient Instructions below. Patient Instructions  Medication Instructions:  Your physician recommends that you continue on your  current medications as directed. Please refer  to the Current Medication list given to you today.  *If you need a refill on your cardiac medications before your next appointment, please call your pharmacy*   Lab Work: NONE   If you have labs (blood work) drawn today and your tests are completely normal, you will receive your results only by: Marland Kitchen MyChart Message (if you have MyChart) OR . A paper copy in the mail If you have any lab test that is abnormal or we need to change your treatment, we will call you to review the results.   Testing/Procedures: Your physician has recommended that you wear an event monitor. Event monitors are medical devices that record the heart's electrical activity. Doctors most often [place] these monitors to diagnose arrhythmias. Arrhythmias are problems with the speed or rhythm of the heartbeat. The monitor is a small, portable device. You can wear one while you do your normal daily activities. This is usually used to diagnose what is causing palpitations/syncope (passing out). Preventice for 30 days.      Follow-Up: At [facility], you and your health needs are our priority.  As part of our continuing mission to provide you with exceptional heart care, we have created designated Provider Care Teams.  These Care Teams include your primary Cardiologist (physician) and Advanced Practice Providers (APPs -  Physician Assistants and Nurse Practitioners) who all work together to provide you with the care you need, when you need it.  We recommend signing up for the patient portal called "MyChart".  Sign up information is provided on this After Visit Summary.  MyChart is used to connect with patients for Virtual Visits (Telemedicine).  Patients are able to view lab/test results, encounter notes, upcoming appointments, etc.  Non-urgent messages can be sent to your provider as well.   To learn more about what you can do with MyChart, go to [email or web address].    Your next  appointment:   1 year(s)  The format for your next appointment:   In Person  Provider:   Carlyle Dolly, MD   Other Instructions Thank you for choosing [facility]!       Signed, Erma Heritage, PA-C  02/29/2020 8:00 PM    [place]. [address] [place], [place] [address] Phone: [phone] Fax: [phone]

## 2020-02-29 NOTE — Patient Instructions (Signed)
Medication Instructions:  Your physician recommends that you continue on your current medications as directed. Please refer to the Current Medication list given to you today.  *If you need a refill on your cardiac medications before your next appointment, please call your pharmacy*   Lab Work: NONE   If you have labs (blood work) drawn today and your tests are completely normal, you will receive your results only by: Marland Kitchen MyChart Message (if you have MyChart) OR . A paper copy in the mail If you have any lab test that is abnormal or we need to change your treatment, we will call you to review the results.   Testing/Procedures: Your physician has recommended that you wear an event monitor. Event monitors are medical devices that record the heart's electrical activity. Doctors most often [place] these monitors to diagnose arrhythmias. Arrhythmias are problems with the speed or rhythm of the heartbeat. The monitor is a small, portable device. You can wear one while you do your normal daily activities. This is usually used to diagnose what is causing palpitations/syncope (passing out). Preventice for 30 days.      Follow-Up: At [facility], you and your health needs are our priority.  As part of our continuing mission to provide you with exceptional heart care, we have created designated Provider Care Teams.  These Care Teams include your primary Cardiologist (physician) and Advanced Practice Providers (APPs -  Physician Assistants and Nurse Practitioners) who all work together to provide you with the care you need, when you need it.  We recommend signing up for the patient portal called "MyChart".  Sign up information is provided on this After Visit Summary.  MyChart is used to connect with patients for Virtual Visits (Telemedicine).  Patients are able to view lab/test results, encounter notes, upcoming appointments, etc.  Non-urgent messages can be sent to your provider as well.   To learn more  about what you can do with MyChart, go to [email or web address].    Your next appointment:   1 year(s)  The format for your next appointment:   In Person  Provider:   Carlyle Dolly, MD   Other Instructions Thank you for choosing [facility]!

## 2020-03-12 ENCOUNTER — Other Ambulatory Visit: Payer: Self-pay

## 2020-03-12 ENCOUNTER — Ambulatory Visit (INDEPENDENT_AMBULATORY_CARE_PROVIDER_SITE_OTHER): Payer: Medicare HMO

## 2020-03-12 DIAGNOSIS — G459 Transient cerebral ischemic attack, unspecified: Secondary | ICD-10-CM | POA: Diagnosis not present

## 2020-03-12 DIAGNOSIS — G464 Cerebellar stroke syndrome: Secondary | ICD-10-CM | POA: Diagnosis not present

## 2020-03-12 NOTE — Addendum Note (Signed)
Addended by: Levonne Hubert on: 03/12/2020 05:12 PM   Modules accepted: Orders

## 2020-04-08 DIAGNOSIS — E1129 Type 2 diabetes mellitus with other diabetic kidney complication: Secondary | ICD-10-CM | POA: Diagnosis not present

## 2020-04-08 DIAGNOSIS — N183 Chronic kidney disease, stage 3 unspecified: Secondary | ICD-10-CM | POA: Diagnosis not present

## 2020-04-08 DIAGNOSIS — I10 Essential (primary) hypertension: Secondary | ICD-10-CM | POA: Diagnosis not present

## 2020-04-08 DIAGNOSIS — Z79899 Other long term (current) drug therapy: Secondary | ICD-10-CM | POA: Diagnosis not present

## 2020-04-15 DIAGNOSIS — E785 Hyperlipidemia, unspecified: Secondary | ICD-10-CM | POA: Diagnosis not present

## 2020-04-15 DIAGNOSIS — C259 Malignant neoplasm of pancreas, unspecified: Secondary | ICD-10-CM | POA: Diagnosis not present

## 2020-04-15 DIAGNOSIS — G451 Carotid artery syndrome (hemispheric): Secondary | ICD-10-CM | POA: Diagnosis not present

## 2020-04-15 DIAGNOSIS — I10 Essential (primary) hypertension: Secondary | ICD-10-CM | POA: Diagnosis not present

## 2020-04-15 DIAGNOSIS — E1122 Type 2 diabetes mellitus with diabetic chronic kidney disease: Secondary | ICD-10-CM | POA: Diagnosis not present

## 2020-04-15 DIAGNOSIS — Z0001 Encounter for general adult medical examination with abnormal findings: Secondary | ICD-10-CM | POA: Diagnosis not present

## 2020-04-15 DIAGNOSIS — I2510 Atherosclerotic heart disease of native coronary artery without angina pectoris: Secondary | ICD-10-CM | POA: Diagnosis not present

## 2020-04-15 DIAGNOSIS — R7309 Other abnormal glucose: Secondary | ICD-10-CM | POA: Diagnosis not present

## 2020-04-15 DIAGNOSIS — N1832 Chronic kidney disease, stage 3b: Secondary | ICD-10-CM | POA: Diagnosis not present

## 2020-04-23 ENCOUNTER — Telehealth: Payer: Self-pay | Admitting: Student

## 2020-04-23 NOTE — Telephone Encounter (Signed)
New message     Patient calling to get results from monitor

## 2020-04-24 NOTE — Telephone Encounter (Signed)
Patient is aware that Dr.Branch needs to result report to us.patient is ok with this.

## 2020-04-24 NOTE — Telephone Encounter (Signed)
New message   2nd call for monitor results , last day he wore monitor was the 17th and he mailed it back.

## 2020-04-25 ENCOUNTER — Telehealth: Payer: Self-pay | Admitting: Student

## 2020-04-25 ENCOUNTER — Telehealth: Payer: Self-pay | Admitting: *Deleted

## 2020-04-25 DIAGNOSIS — Z85828 Personal history of other malignant neoplasm of skin: Secondary | ICD-10-CM | POA: Diagnosis not present

## 2020-04-25 DIAGNOSIS — Z08 Encounter for follow-up examination after completed treatment for malignant neoplasm: Secondary | ICD-10-CM | POA: Diagnosis not present

## 2020-04-25 DIAGNOSIS — X32XXXD Exposure to sunlight, subsequent encounter: Secondary | ICD-10-CM | POA: Diagnosis not present

## 2020-04-25 DIAGNOSIS — L570 Actinic keratosis: Secondary | ICD-10-CM | POA: Diagnosis not present

## 2020-04-25 DIAGNOSIS — L820 Inflamed seborrheic keratosis: Secondary | ICD-10-CM | POA: Diagnosis not present

## 2020-04-25 MED ORDER — METOPROLOL TARTRATE 50 MG PO TABS
50.0000 mg | ORAL_TABLET | Freq: Two times a day (BID) | ORAL | 3 refills | Status: DC
Start: 2020-04-25 — End: 2020-05-03

## 2020-04-25 NOTE — Telephone Encounter (Signed)
New message     New prescription needs to go to [facility] is provider Fax [phone]   Fax new prescription to this number asap and the monitor results

## 2020-04-25 NOTE — Telephone Encounter (Signed)
-----   Message from Erma Heritage, [place] sent at 04/24/2020  4:42 PM EST ----- Please let the patient know his event monitor showed normal sinus rhythm with no significant arrhythmias like atrial fibrillation or atrial flutter to account for possible TIA's. He was found to have frequent sinus bradycardia as his average heart rate was in the 40's while he wore the monitor which can contribute to fatigue and can cause dizziness. He is on Lopressor 100 mg twice daily and I would recommend reducing this to 50 mg twice daily given his significant bradycardia. He should continue to follow BP at home as Lopressor does impact the heart rate and blood pressure, therefore we might have to further adjust some of his blood pressure medications going forward to account for the reduction in this.

## 2020-04-25 NOTE — Telephone Encounter (Signed)
I faxed monitor result [facility] will fax prescription after it is signed by provider.

## 2020-04-29 ENCOUNTER — Telehealth: Payer: Self-pay | Admitting: Student

## 2020-04-29 NOTE — Telephone Encounter (Signed)
New message    Since changing Jonathan Avery medication his bp is running higher than normal   Today 126/56 196/82 Yesterday 170/76 156/78 Saturday 136/58 180/78 Friday 146/78

## 2020-04-30 MED ORDER — HYDRALAZINE HCL 50 MG PO TABS
50.0000 mg | ORAL_TABLET | Freq: Three times a day (TID) | ORAL | 3 refills | Status: DC
Start: 2020-04-30 — End: 2020-05-13

## 2020-04-30 NOTE — Telephone Encounter (Signed)
I spoke with family and they confirmed hydralazine is currently BID.They will increase to TID and monitor BP.E-scribed to [facility].

## 2020-04-30 NOTE — Telephone Encounter (Signed)
     I am not surprised his readings have become elevated given the decrease in Lopressor but I am glad to see his heart rate is starting to improve. I would recommend increasing Hydralazine from 50 mg twice daily to 50 mg 3 times daily. If he is already taking this as 50 mg 3 times daily, would go to 75 mg 3 times daily and continue to follow BP.  Signed, Erma Heritage, PA-C 04/30/2020, 2:27 PM Pager: [phone]

## 2020-04-30 NOTE — Telephone Encounter (Signed)
Family reports heart rates since decrease in Lopressor: 44,56,48,52,56,48,56   I will forward to B.Strader, PA-C for dispo.

## 2020-05-03 ENCOUNTER — Telehealth: Payer: Self-pay | Admitting: Student

## 2020-05-03 MED ORDER — METOPROLOL TARTRATE 50 MG PO TABS: 50.0000 mg | ORAL_TABLET | Freq: Two times a day (BID) | ORAL | 3 refills | Status: DC

## 2020-05-03 NOTE — Telephone Encounter (Signed)
New message      *STAT* If patient is at the pharmacy, call can be transferred to refill team.   1. Which medications need to be refilled? (please list name of each medication and dose if known) metoprolol tartrate (LOPRESSOR) 50 MG tablet  2. Which pharmacy/location (including street and city if local pharmacy) is medication to be sent to? [facility]   3. Do they need a 30 day or 90 day supply? [place]

## 2020-05-03 NOTE — Telephone Encounter (Signed)
Refilled  lopressor 50 mg bid to [facility]

## 2020-05-10 ENCOUNTER — Encounter: Payer: Self-pay | Admitting: Student

## 2020-05-13 ENCOUNTER — Telehealth: Payer: Self-pay | Admitting: *Deleted

## 2020-05-13 MED ORDER — HYDRALAZINE HCL 50 MG PO TABS
75.0000 mg | ORAL_TABLET | Freq: Three times a day (TID) | ORAL | 3 refills | Status: DC
Start: 2020-05-13 — End: 2020-05-28

## 2020-05-13 NOTE — Telephone Encounter (Signed)
Pt notified order placed

## 2020-05-13 NOTE — Telephone Encounter (Signed)
-----   Message from Erma Heritage, [place] sent at 05/12/2020  6:25 PM EST ----- Please let the patient know I reviewed his BP log and the notes provided from where his Nephrologist discontinued his Amlodipine. Given BP readings remain elevated, I would recommend further increasing Hydralazine to 73m three times daily. Continue to follow readings at home as this may require further titration but would make gradual adjustments given all of his recent medication changes. Also, if the swelling in his legs does not improve with discontinuation of Amlodipine, I would recommend they make his Nephrologist aware.

## 2020-05-27 ENCOUNTER — Encounter: Payer: Self-pay | Admitting: Student

## 2020-05-27 ENCOUNTER — Telehealth: Payer: Self-pay | Admitting: Student

## 2020-05-27 NOTE — Telephone Encounter (Signed)
New message     Please call if you need to adjust patients meds,  See bp list scanned in

## 2020-05-28 MED ORDER — HYDRALAZINE HCL 100 MG PO TABS
100.0000 mg | ORAL_TABLET | Freq: Three times a day (TID) | ORAL | 5 refills | Status: DC
Start: 2020-05-28 — End: 2023-02-18

## 2020-05-28 NOTE — Telephone Encounter (Signed)
-----   Message from Erma Heritage, [place] sent at 05/28/2020  9:24 AM EST ----- Please let the patient know I reviewed his BP log and readings overall remain elevated. Would recommend increasing his Hydralazine to 144m TID. He can use the remaining 547mtablets and we can send in a prescription for 10063mablets to make this easier. Please check to see if his swelling improved with discontinuation of Amlodipine by his Nephrologist. If it did not, we may need to consider adding this back in the future.

## 2020-05-28 NOTE — Telephone Encounter (Signed)
Called patient and he verbalized understanding of the medication changes. He also stated that since being taken off the Amlodipine by his neurologist, the swelling that he has had, improved drastically. I relayed to him that he can take two (2) 50 mg Hydralazine tablets TID, but was asked to call in the 100 mg tablets to make things a little easier.

## 2020-05-30 ENCOUNTER — Observation Stay (HOSPITAL_COMMUNITY)
Admission: EM | Admit: 2020-05-30 | Discharge: 2020-05-31 | Disposition: A | Discharge status: Home / Self Care | Payer: Medicare HMO | Attending: Internal Medicine | Admitting: Internal Medicine

## 2020-05-30 ENCOUNTER — Encounter (HOSPITAL_COMMUNITY): Payer: Self-pay

## 2020-05-30 ENCOUNTER — Emergency Department (HOSPITAL_COMMUNITY): Payer: Medicare HMO

## 2020-05-30 ENCOUNTER — Other Ambulatory Visit: Payer: Self-pay

## 2020-05-30 DIAGNOSIS — N183 Chronic kidney disease, stage 3 unspecified: Secondary | ICD-10-CM | POA: Diagnosis not present

## 2020-05-30 DIAGNOSIS — I161 Hypertensive emergency: Secondary | ICD-10-CM | POA: Diagnosis not present

## 2020-05-30 DIAGNOSIS — I6389 Other cerebral infarction: Secondary | ICD-10-CM | POA: Diagnosis not present

## 2020-05-30 DIAGNOSIS — Z79899 Other long term (current) drug therapy: Secondary | ICD-10-CM | POA: Diagnosis not present

## 2020-05-30 DIAGNOSIS — I6523 Occlusion and stenosis of bilateral carotid arteries: Secondary | ICD-10-CM | POA: Diagnosis not present

## 2020-05-30 DIAGNOSIS — I639 Cerebral infarction, unspecified: Secondary | ICD-10-CM

## 2020-05-30 DIAGNOSIS — I10 Essential (primary) hypertension: Secondary | ICD-10-CM | POA: Diagnosis not present

## 2020-05-30 DIAGNOSIS — R4701 Aphasia: Secondary | ICD-10-CM | POA: Diagnosis not present

## 2020-05-30 DIAGNOSIS — I674 Hypertensive encephalopathy: Secondary | ICD-10-CM | POA: Diagnosis not present

## 2020-05-30 DIAGNOSIS — R29818 Other symptoms and signs involving the nervous system: Secondary | ICD-10-CM | POA: Diagnosis not present

## 2020-05-30 DIAGNOSIS — I6782 Cerebral ischemia: Secondary | ICD-10-CM | POA: Insufficient documentation

## 2020-05-30 DIAGNOSIS — U071 COVID-19: Secondary | ICD-10-CM | POA: Diagnosis not present

## 2020-05-30 DIAGNOSIS — E1122 Type 2 diabetes mellitus with diabetic chronic kidney disease: Secondary | ICD-10-CM | POA: Diagnosis not present

## 2020-05-30 DIAGNOSIS — Z794 Long term (current) use of insulin: Secondary | ICD-10-CM | POA: Insufficient documentation

## 2020-05-30 DIAGNOSIS — Z87891 Personal history of nicotine dependence: Secondary | ICD-10-CM | POA: Diagnosis not present

## 2020-05-30 LAB — I-STAT CHEM 8, ED
BUN: 23 mg/dL (ref 8–23)
Calcium, Ion: 1.21 mmol/L (ref 1.15–1.40)
Chloride: 105 mmol/L (ref 98–111)
Creatinine, Ser: 1.5 mg/dL — ABNORMAL HIGH (ref 0.61–1.24)
Glucose, Bld: 166 mg/dL — ABNORMAL HIGH (ref 70–99)
HCT: 42 % (ref 39.0–52.0)
Hemoglobin: 14.3 g/dL (ref 13.0–17.0)
Potassium: 3.8 mmol/L (ref 3.5–5.1)
Sodium: 137 mmol/L (ref 135–145)
TCO2: 22 mmol/L (ref 22–32)

## 2020-05-30 LAB — URINALYSIS, ROUTINE W REFLEX MICROSCOPIC
Bilirubin Urine: NEGATIVE
Glucose, UA: 50 mg/dL — AB
Hgb urine dipstick: NEGATIVE
Ketones, ur: NEGATIVE mg/dL
Leukocytes,Ua: NEGATIVE
Nitrite: NEGATIVE
Protein, ur: >=300 mg/dL — AB
Specific Gravity, Urine: 1.009 (ref 1.005–1.030)
pH: 7 (ref 5.0–8.0)

## 2020-05-30 LAB — COMPREHENSIVE METABOLIC PANEL
ALT: 13 U/L (ref 0–44)
AST: 18 U/L (ref 15–41)
Albumin: 3.5 g/dL (ref 3.5–5.0)
Alkaline Phosphatase: 51 U/L (ref 38–126)
Anion gap: 10 (ref 5–15)
BUN: 24 mg/dL — ABNORMAL HIGH (ref 8–23)
CO2: 22 mmol/L (ref 22–32)
Calcium: 9.1 mg/dL (ref 8.9–10.3)
Chloride: 104 mmol/L (ref 98–111)
Creatinine, Ser: 1.57 mg/dL — ABNORMAL HIGH (ref 0.61–1.24)
GFR, Estimated: 42 mL/min — ABNORMAL LOW (ref >60)
Glucose, Bld: 164 mg/dL — ABNORMAL HIGH (ref 70–99)
Potassium: 3.7 mmol/L (ref 3.5–5.1)
Sodium: 136 mmol/L (ref 135–145)
Total Bilirubin: 0.9 mg/dL (ref 0.3–1.2)
Total Protein: 6.7 g/dL (ref 6.5–8.1)

## 2020-05-30 LAB — DIFFERENTIAL
Abs Immature Granulocytes: 0.03 10*3/uL (ref 0.00–0.07)
Basophils Absolute: 0.1 10*3/uL (ref 0.0–0.1)
Basophils Relative: 1 %
Eosinophils Absolute: 0.4 10*3/uL (ref 0.0–0.5)
Eosinophils Relative: 4 %
Immature Granulocytes: 0 %
Lymphocytes Relative: 32 %
Lymphs Abs: 3 10*3/uL (ref 0.7–4.0)
Monocytes Absolute: 0.5 10*3/uL (ref 0.1–1.0)
Monocytes Relative: 6 %
Neutro Abs: 5.4 10*3/uL (ref 1.7–7.7)
Neutrophils Relative %: 57 %

## 2020-05-30 LAB — CBC
HCT: 45.2 % (ref 39.0–52.0)
Hemoglobin: 15.3 g/dL (ref 13.0–17.0)
MCH: 29.9 pg (ref 26.0–34.0)
MCHC: 33.8 g/dL (ref 30.0–36.0)
MCV: 88.5 fL (ref 80.0–100.0)
Platelets: 173 10*3/uL (ref 150–400)
RBC: 5.11 MIL/uL (ref 4.22–5.81)
RDW: 13.4 % (ref 11.5–15.5)
WBC: 9.4 10*3/uL (ref 4.0–10.5)
nRBC: 0 % (ref 0.0–0.2)

## 2020-05-30 LAB — FERRITIN: Ferritin: 53 ng/mL (ref 24–336)

## 2020-05-30 LAB — APTT: aPTT: 29 seconds (ref 24–36)

## 2020-05-30 LAB — D-DIMER, QUANTITATIVE: D-Dimer, Quant: 1.18 ug/mL-FEU — ABNORMAL HIGH (ref 0.00–0.50)

## 2020-05-30 LAB — CBG MONITORING, ED
Glucose-Capillary: 157 mg/dL — ABNORMAL HIGH (ref 70–99)
Glucose-Capillary: 153 mg/dL — ABNORMAL HIGH (ref 70–99)

## 2020-05-30 LAB — PROTIME-INR
INR: 0.9 (ref 0.8–1.2)
Prothrombin Time: 11.8 seconds (ref 11.4–15.2)

## 2020-05-30 LAB — RESP PANEL BY RT-PCR (FLU A&B, COVID) ARPGX2
Influenza A by PCR: NEGATIVE
Influenza B by PCR: NEGATIVE
SARS Coronavirus 2 by RT PCR: POSITIVE — AB

## 2020-05-30 LAB — RAPID URINE DRUG SCREEN, HOSP PERFORMED
Amphetamines: NOT DETECTED
Barbiturates: NOT DETECTED
Benzodiazepines: NOT DETECTED
Cocaine: NOT DETECTED
Opiates: NOT DETECTED
Tetrahydrocannabinol: NOT DETECTED

## 2020-05-30 LAB — C-REACTIVE PROTEIN: CRP: 0.9 mg/dL (ref <1.0)

## 2020-05-30 LAB — ETHANOL: Alcohol, Ethyl (B): <10 mg/dL (ref <10)

## 2020-05-30 MED ORDER — INSULIN ASPART 100 UNIT/ML ~~LOC~~ SOLN
10.0000 [IU] | Freq: Two times a day (BID) | SUBCUTANEOUS | Status: DC
  Administered 2020-05-31: 10 [IU] via SUBCUTANEOUS

## 2020-05-30 MED ORDER — ONDANSETRON HCL 4 MG/2ML IJ SOLN: 4.0000 mg | Freq: Four times a day (QID) | INTRAMUSCULAR | Status: DC | PRN

## 2020-05-30 MED ORDER — ONDANSETRON HCL 4 MG PO TABS
4.0000 mg | ORAL_TABLET | Freq: Four times a day (QID) | ORAL | Status: DC | PRN
  Administered 2020-05-30: 4 mg via ORAL
  Filled 2020-05-30: qty 1

## 2020-05-30 MED ORDER — CLONIDINE HCL 0.1 MG PO TABS
0.1000 mg | ORAL_TABLET | Freq: Two times a day (BID) | ORAL | Status: DC
  Administered 2020-05-30 – 2020-05-31 (×2): 0.1 mg via ORAL
  Filled 2020-05-30 (×2): qty 1

## 2020-05-30 MED ORDER — LOSARTAN POTASSIUM 50 MG PO TABS
100.0000 mg | ORAL_TABLET | Freq: Every day | ORAL | Status: DC
  Administered 2020-05-31: 100 mg via ORAL
  Filled 2020-05-30: qty 2

## 2020-05-30 MED ORDER — ASPIRIN EC 81 MG PO TBEC
81.0000 mg | DELAYED_RELEASE_TABLET | Freq: Every day | ORAL | Status: DC
  Administered 2020-05-30: 81 mg via ORAL
  Filled 2020-05-30 (×2): qty 1

## 2020-05-30 MED ORDER — ACETAMINOPHEN 325 MG PO TABS
650.0000 mg | ORAL_TABLET | Freq: Four times a day (QID) | ORAL | Status: DC | PRN
  Administered 2020-05-30: 650 mg via ORAL
  Filled 2020-05-30: qty 2

## 2020-05-30 MED ORDER — HYDRALAZINE HCL 20 MG/ML IJ SOLN
10.0000 mg | Freq: Once | INTRAMUSCULAR | Status: AC
  Administered 2020-05-30: 10 mg via INTRAVENOUS
  Filled 2020-05-30: qty 1

## 2020-05-30 MED ORDER — INSULIN GLARGINE 100 UNIT/ML ~~LOC~~ SOLN
40.0000 [IU] | Freq: Two times a day (BID) | SUBCUTANEOUS | Status: DC
  Administered 2020-05-31: 40 [IU] via SUBCUTANEOUS
  Filled 2020-05-30 (×10): qty 0.4

## 2020-05-30 MED ORDER — ONDANSETRON HCL 4 MG/2ML IJ SOLN: 4.0000 mg | Freq: Once | INTRAMUSCULAR | Status: AC

## 2020-05-30 MED ORDER — INSULIN ASPART 100 UNIT/ML FLEXPEN: 10.0000 [IU] | PEN_INJECTOR | Freq: Two times a day (BID) | SUBCUTANEOUS | Status: DC

## 2020-05-30 MED ORDER — ACETAMINOPHEN 650 MG RE SUPP: 650.0000 mg | Freq: Four times a day (QID) | RECTAL | Status: DC | PRN

## 2020-05-30 MED ORDER — ONDANSETRON HCL 4 MG/2ML IJ SOLN
INTRAMUSCULAR | Status: AC
  Administered 2020-05-30: 4 mg via INTRAVENOUS
  Filled 2020-05-30: qty 2

## 2020-05-30 MED ORDER — ENOXAPARIN SODIUM 40 MG/0.4ML ~~LOC~~ SOLN
40.0000 mg | SUBCUTANEOUS | Status: DC
  Administered 2020-05-30: 40 mg via SUBCUTANEOUS
  Filled 2020-05-30: qty 0.4

## 2020-05-30 MED ORDER — INSULIN ASPART 100 UNIT/ML ~~LOC~~ SOLN
0.0000 [IU] | Freq: Every day | SUBCUTANEOUS | Status: DC
  Filled 2020-05-30: qty 1

## 2020-05-30 MED ORDER — SIMVASTATIN 20 MG PO TABS: 20.0000 mg | ORAL_TABLET | Freq: Every day | ORAL | Status: DC

## 2020-05-30 MED ORDER — CLEVIDIPINE BUTYRATE 0.5 MG/ML IV EMUL
1.0000 mg/h | INTRAVENOUS | Status: DC
  Filled 2020-05-30: qty 50

## 2020-05-30 MED ORDER — HYDRALAZINE HCL 20 MG/ML IJ SOLN: 10.0000 mg | INTRAMUSCULAR | Status: DC | PRN

## 2020-05-30 MED ORDER — INSULIN ASPART 100 UNIT/ML ~~LOC~~ SOLN
0.0000 [IU] | Freq: Three times a day (TID) | SUBCUTANEOUS | Status: DC
  Administered 2020-05-31: 3 [IU] via SUBCUTANEOUS
  Administered 2020-05-31: 2 [IU] via SUBCUTANEOUS

## 2020-05-30 MED ORDER — INSULIN GLARGINE 100 UNIT/ML SOLOSTAR PEN: 40.0000 [IU] | PEN_INJECTOR | Freq: Two times a day (BID) | SUBCUTANEOUS | Status: DC

## 2020-05-30 MED ORDER — HYDRALAZINE HCL 20 MG/ML IJ SOLN
20.0000 mg | Freq: Once | INTRAMUSCULAR | Status: AC
  Administered 2020-05-30: 20 mg via INTRAVENOUS
  Filled 2020-05-30: qty 1

## 2020-05-30 MED ORDER — GADOBUTROL 1 MMOL/ML IV SOLN
10.0000 mL | Freq: Once | INTRAVENOUS | Status: AC | PRN
  Administered 2020-05-30: 10 mL via INTRAVENOUS

## 2020-05-30 MED ORDER — TAMSULOSIN HCL 0.4 MG PO CAPS
0.4000 mg | ORAL_CAPSULE | Freq: Every evening | ORAL | Status: DC
  Administered 2020-05-30: 0.4 mg via ORAL
  Filled 2020-05-30: qty 1

## 2020-05-30 MED ORDER — METOPROLOL TARTRATE 50 MG PO TABS
50.0000 mg | ORAL_TABLET | Freq: Two times a day (BID) | ORAL | Status: DC
  Administered 2020-05-30 – 2020-05-31 (×2): 50 mg via ORAL
  Filled 2020-05-30 (×2): qty 1

## 2020-05-30 MED ORDER — HYDRALAZINE HCL 25 MG PO TABS
100.0000 mg | ORAL_TABLET | Freq: Three times a day (TID) | ORAL | Status: DC
  Administered 2020-05-30 – 2020-05-31 (×3): 100 mg via ORAL
  Filled 2020-05-30 (×5): qty 4

## 2020-05-30 MED ORDER — ASPIRIN 325 MG PO TABS
325.0000 mg | ORAL_TABLET | Freq: Once | ORAL | Status: AC
  Administered 2020-05-30: 325 mg via ORAL
  Filled 2020-05-30: qty 1

## 2020-05-30 NOTE — Progress Notes (Signed)
Code Stroke Time Documentation   0379 Call Time  Beeper Time 0800 Exam Started  0803 Exam Finished 0803 Images sent to [place] Exam completed in [place] radiology called

## 2020-05-30 NOTE — ED Notes (Signed)
Pt transported to MRI 

## 2020-05-30 NOTE — Consult Note (Signed)
[facility] TELEMEDICINE  CONSULT   Date of service: May 30, 2020 Patient Name: Jonathan Avery MRN:  [id number] DOB:  1930/10/23 Requesting Provider: Dr. Langston Masker Location of the provider: [facility] Location of the patient: ED Reason for consult: "slurred speech"   This consult was provided via telemedicine with 2-way video and audio communication. The patient/family was informed that care would be provided in this way and agreed to receive care in this manner.    History of Present Illness  Jonathan Avery is a 87 y.o. male right handed  has a past medical history of BPH, Cancer, DM 2, HLD, HTN, MI, PAD, bilateral carotid stenosis s/p right endarterectomy in Aug 2021, TIA and Stroke who presents with  Aphasia. The patient's neighbor Jonathan Avery (at the bedside) stated LN 1930 and the the patient called him ~1 hour at 0700 complaining of difficulty speaking. The patient was aphasic in the ED unable to convey when his symptoms began, or whether he woke up with them. Reportedly told ED staff "I am having a hard time understanding you." Friend stated, "He usually wakes up around 6 AM, and he called me around 7 AM telling me he couldn't speak."  It is unclear if there is mild aphasia at baseline.  The patient denies active headache. He reports difficulty speaking. Per medical record review, this is happened several times in the past, it is documented history of TIAs with expressive aphasia in the past.  No AC - only takes aspirin 94m daily.  Stroke Measures   Last Known Well: 1930 TPA Given: No IR Thrombectomy: No lvo mRS: Modified Rankin Scale: 1-No significant post stroke disability and can perform usual duties with stroke symptoms Time of teleneurologist evaluation: [place]   05/30/20 0806 05/30/20 0815 05/30/20 0830 05/30/20 0845  BP: (!) 168/118 (!) 196/122 (!) 217/87 (!) 223/98  Pulse: 71 72 70 70  Resp: 17 18 (!) 21 14   SpO2: 92% 96% 94% 97%  Height:         Body mass index is 34.32 kg/m.  Tele-neuro Exam and NIHSS   General: NAD  LOC Responsiveness 0 LOC Questions 1 LOC Commands 0 Horizontal eye movement 0 Visual field 0 Facial palsy 0 Motor arm - Right arm 0 Motor arm - Left arm 0 Motor leg - Right leg 0 Motor leg - Left leg 0 Limb ataxia 1 dysmetria left upper extremity. Sensory test 0 Language 2 Speech 0 Extinction and inattention 0  Result  NIH Stroke Scale/Score (NIHSS) 4   Other exam findings:  Mild impaired fluency, impaired comprehension, impaired repetition  Mild left upper extremity low amplitude course tremor.  Imaging and Labs   CBC:  Recent Labs  Lab 05/30/20 0755 05/30/20 0756  WBC 9.4  --   NEUTROABS 5.4  --   HGB 15.3 14.3  HCT 45.2 42.0  MCV 88.5  --   PLT 173  --     Basic Metabolic Panel:  Lab Results  Component Value Date   NA 137 05/30/2020   K 3.8 05/30/2020   CO2 22 05/30/2020   GLUCOSE 166 (H) 05/30/2020   BUN 23 05/30/2020   CREATININE 1.50 (H) 05/30/2020   CALCIUM 9.1 05/30/2020   GFRNONAA 42 (L) 05/30/2020   GFRAA 45 (L) 12/30/2019   Lipid Panel: No results found for: LDLCALC HgbA1c:  Lab Results  Component Value Date   HGBA1C 7.7 (H) 12/29/2019  Urine Drug Screen: No results found for: LABOPIA, COCAINSCRNUR, LABBENZ, AMPHETMU, THCU, LABBARB  Alcohol Level     Component Value Date/Time   [facility] <10 05/30/2020 0755    Results for orders placed during the hospital encounter of 05/30/20  CT HEAD CODE STROKE WO CONTRAST  Narrative CLINICAL DATA:  Code stroke.  Aphasia  EXAM: CT HEAD WITHOUT CONTRAST  TECHNIQUE: Contiguous axial images were obtained from the base of the skull through the vertex without intravenous contrast.  COMPARISON:  12/22/2019  FINDINGS: Brain: No acute intracranial hemorrhage, mass effect, or edema. Gray-white differentiation remains preserved. Chronic small vessel infarct of the left lentiform  nucleus. New age-indeterminate small vessel infarct of the left putamen. Additional patchy hypoattenuation in the supratentorial white matter is nonspecific but may reflect stable mild chronic microvascular ischemic changes. Prominence of the ventricles and sulci reflects minor generalized parenchymal volume loss. There is no extra-axial collection.  Vascular: No hyperdense vessel. There is intracranial atherosclerotic calcification at the skull base.  Skull: Unremarkable.  Sinuses/Orbits: No acute finding.  Other: Mastoid air cells are clear.  ASPECTS ([place] Stroke Program Early CT Score)  - Ganglionic level infarction (caudate, lentiform nuclei, internal capsule, insula, M1-M3 cortex): 7  - Supraganglionic infarction (M4-M6 cortex): 3  Total score (0-10 with 10 being normal): 10  IMPRESSION: No acute intracranial hemorrhage or definite acute infarction. Age-indeterminate small vessel infarct of the left putamen.  Chronic microvascular ischemic changes. Chronic left basal ganglia infarct.  These results were called by telephone at the time of interpretation on 05/30/2020 at 8:14 am to provider [facility] , who verbally acknowledged these results.   Electronically Signed By: Macy Mis M.D. On: 05/30/2020 08:15  Reviewed MRI brain MRA head and neck which did not show acute infarct. Chronic left basal ganglia infarct and chronic microvascular ischemic disease. No evidence of large vessel occlusion though motion degraded. Left supraclinoid ICA narrowing appears similar to prior when comparing across modalities. Additionally, similar mild narrowing of the right P1 PCA and left P2 PCA.  Impression   Jonathan Avery is a 87 y.o. male right handed with PMHx significant for DM 2, HLD, HTN, MI, PAD, bilateral carotid stenosis s/p right EAC Aug 2021, TIA and Stroke. His limited tele-neurologic examination is notable for transcortical mixed/mild global aphasia. Wake-up stroke  with last normal 1930. NCT head did not show acute ischemic changes and chronic left lentiform lacune. Initial NIHSS 5 which improved to 4. BP prior to evaluation 168/118--->192/122 at end of evaluation. Patient has significant vascular risk factors and would benefit from MRI to evaluate timing of stroke for possible tPA. Requested STAT bedside swallow and one time dose of ASA 322m. Discussed with ED and STAT MRI ordered as well as vessel imaging in light of his recent right EAC and left stenosis.   Recommendations    Recommend admitting for hypertensive encephalopathy and gradual reduction of BP pressure. Gradually reduce MAP ~10-20% the first hour and by 5-15% over the next 23 hours.  Target blood pressure of <180/<1238mg for the first hour. Then <160/<11034m for the next 23 hours.  Continue aspirin for intracranial atherosclerosis.    Electronically signed by:  HunLynnae SandhoffD Page: [id number]/2022, 8:50 AM  This patient is receiving care for possible acute neurological changes. There was 70 minutes of care by this provider at the time of service, including time for direct evaluation via telemedicine, review of medical records, imaging studies and discussion of findings with providers, the patient and/or  family.

## 2020-05-30 NOTE — ED Notes (Signed)
Dr Roderic Palau notified BP 174/60, orders received to continue to hold Clevidipine.

## 2020-05-30 NOTE — H&P (Signed)
History and Physical    Jonathan Avery OJJ:[id number] DOB: 1931/01/27 DOA: 05/30/2020  PCP: Asencion Noble, MD  Patient coming from: home  I have personally briefly reviewed patient's old medical records in [place]  Chief Complaint: difficulty with speech  HPI: Jonathan Avery is a 87 y.o. male with medical history significant of HTN, CAD, DM2, presents to the hospital with aphasia. Patient was last known normal before he went to bed last night. He work up this morning and noted to have expressive aphasia. He denied any other unilateral weakness or numbness. No changes in vision or headache. He did have some nausea and vomiting. On arrival to ED, patient was noted to be hypertensive with sbp>200. He reports taking his medication this morning, He denies any chest pain or nausea. He has not had any fever, cough or shortness of breath. He was also noted to be covid positive.  Review of Systems:  Review of Systems  Constitutional: Negative for chills and fever.  HENT: Negative for congestion and sore throat.   Eyes: Negative for blurred vision and double vision.  Respiratory: Negative for cough and shortness of breath.   Cardiovascular: Negative for chest pain and leg swelling.  Gastrointestinal: Positive for nausea and vomiting. Negative for diarrhea and heartburn.  Genitourinary: Negative for dysuria.  Neurological: Positive for speech change and weakness. Negative for focal weakness.   .    Past Medical History:  Diagnosis Date  . BPH (benign prostatic hypertrophy)   . Cancer ([place])    skin cancer - basal  . Carotid artery occlusion   . Diabetes ([place])    Type II  . High cholesterol   . Hypertension   . Myocardial infarction ([place])   . Peripheral vascular disease ([place])   . Stroke [facility])      documented 12/28/2019- ? TIA  " weeks 3 weeks ago, expresive aphasia lasted 2 -3 minutes."     Past Surgical History:  Procedure Laterality Date  . CARDIAC CATHETERIZATION    .  CHOLECYSTECTOMY  1973  . COLONOSCOPY  07/20/2006   Dr. [name]:Status post right hemicolectomy. Residual colonic mucosa appeared normal, normal rectum.   . COLONOSCOPY  2006/2007   sprawling villous adenoma at ileocecal valve  . COLONOSCOPY  2011   Dr. [name]: normal rectum, pancolonic diverticulosis, 2 diminutive polyps, with path benign polypoid colonic mucosa  . COLONOSCOPY N/A 01/02/2015   Procedure: COLONOSCOPY;  Surgeon: Daneil Dolin, MD;  Location[facility];  Service: Endoscopy;  Laterality: N/A;  1245  . CORONARY ANGIOPLASTY    . ENDARTERECTOMY Right 12/29/2019   Procedure: ENDARTERECTOMY CAROTID;  Surgeon: [name], MD;  Location: [facility] OR;  Service: Vascular;  Laterality: Right;  . ESOPHAGOGASTRODUODENOSCOPY (EGD) WITH PROPOFOL N/A 01/13/2018   Procedure: ESOPHAGOGASTRODUODENOSCOPY (EGD) WITH PROPOFOL;  Surgeon: [name], MD;  Location: [facility] ENDOSCOPY;  Service: Endoscopy;  Laterality: N/A;  . EUS N/A 01/13/2018   Procedure: UPPER ENDOSCOPIC ULTRASOUND (EUS) RADIAL;  Surgeon: [name], MD;  Location: [facility] ENDOSCOPY;  Service: Endoscopy;  Laterality: N/A;  . FINE NEEDLE ASPIRATION N/A 01/13/2018   Procedure: FINE NEEDLE ASPIRATION (FNA) LINEAR;  Surgeon: [name], MD;  Location: [facility] ENDOSCOPY;  Service: Endoscopy;  Laterality: N/A;  . open hemicolectomy  2007   due to villous adenoma  . PATCH ANGIOPLASTY Right 12/29/2019   Procedure: RIGHT CAROTID PATCH ANGIOPLASTY USING HEMASHIELD PLATINUM FINESSE;  Surgeon: [name], MD;  Location: [place];  Service:  Vascular;  Laterality: Right;    Social History:  reports that he has quit smoking. His smoking use included cigars. He quit after 10.00 years of use. He has never used smokeless tobacco. He reports that he does not drink alcohol and does not use drugs.  No Known Allergies  Family History  Problem Relation Age of Onset  . Ulcers Father   . Diabetes type II Brother   . Diabetes type II Brother   . Colon  cancer Neg Hx     Prior to Admission medications   Medication Sig Start Date End Date Taking? Authorizing Provider  aspirin 81 MG tablet Take 81 mg by mouth at bedtime.    Yes [provider]  chlorthalidone (HYGROTON) 25 MG tablet Take 1 tablet (25 mg total) by mouth daily. 03/28/18 02/29/20 Yes BranchAlphonse Guild, MD  cloNIDine (CATAPRES) 0.1 MG tablet Take 0.1 mg by mouth 2 (two) times daily. 12/29/19  Yes [provider]  diphenhydramine-acetaminophen (TYLENOL PM) 25-500 MG TABS tablet Take 1 tablet by mouth at bedtime as needed (sleep).   Yes [provider]  hydrALAZINE (APRESOLINE) 100 MG tablet Take 1 tablet (100 mg total) by mouth 3 (three) times daily. Dose change. 05/28/20  Yes Strader, [place] M, PA-C  insulin aspart (NOVOLOG) 100 UNIT/ML FlexPen Inject 10 Units into the skin 2 (two) times daily.    Yes [provider]  LANTUS SOLOSTAR 100 UNIT/ML Solostar Pen Inject 40 Units into the skin 2 (two) times daily.  11/27/14  Yes [provider]  losartan (COZAAR) 50 MG tablet Take 100 mg by mouth daily.   Yes [provider]  magnesium gluconate (MAGONATE) 500 MG tablet Take 500 mg by mouth daily.   Yes [provider]  metFORMIN (GLUCOPHAGE) 500 MG tablet Take 500 mg by mouth in the morning and at bedtime.   Yes [provider]  metoprolol tartrate (LOPRESSOR) 50 MG tablet Take 1 tablet (50 mg total) by mouth 2 (two) times daily. 05/03/20 08/01/20 Yes BranchAlphonse Guild, MD  simvastatin (ZOCOR) 20 MG tablet Take 20 mg by mouth daily at 6 PM.  11/27/14  Yes [provider]  tamsulosin (FLOMAX) 0.4 MG CAPS capsule Take 0.4 mg by mouth every evening.  11/05/14  Yes [provider]  triamcinolone cream (KENALOG) 0.1 % Apply 1 application topically daily as needed (irritation).   Yes [provider]    Physical Exam: Vitals:   05/30/20 1800 05/30/20 1830 05/30/20 1926 05/30/20 2140  BP: (!) 166/66 (!)  168/62 (!) 162/64 (!) 148/55  Pulse:   90 60  Resp:   (!) 22 20  Temp:   100.2 F (37.9 C)   TempSrc:   Oral   SpO2:   94% 92%  Height:        Constitutional: NAD, calm, comfortable Eyes: PERRL, lids and conjunctivae normal ENMT: Mucous membranes are moist. Posterior pharynx clear of any exudate or lesions.Normal dentition.  Neck: normal, supple, no masses, no thyromegaly Respiratory: clear to auscultation bilaterally, no wheezing, no crackles. Normal respiratory effort. No accessory muscle use.  Cardiovascular: Regular rate and rhythm, no murmurs / rubs / gallops. No extremity edema. 2+ pedal pulses. No carotid bruits.  Abdomen: no tenderness, no masses palpated. No hepatosplenomegaly. Bowel sounds positive.  Musculoskeletal: no clubbing / cyanosis. No joint deformity upper and lower extremities. Good ROM, no contractures. Normal muscle tone.  Skin: no rashes, lesions, ulcers. No induration Neurologic: CN 2-12 grossly intact. Sensation  intact, DTR normal. Strength 5/5 in all 4.  Psychiatric: Normal judgment and insight. Alert and oriented x 3. Normal mood.    Labs on Admission: I have personally reviewed following labs and imaging studies  CBC: Recent Labs  Lab 05/30/20 0755 05/30/20 0756  WBC 9.4  --   NEUTROABS 5.4  --   HGB 15.3 14.3  HCT 45.2 42.0  MCV 88.5  --   PLT 173  --    Basic Metabolic Panel: Recent Labs  Lab 05/30/20 0755 05/30/20 0756  NA 136 137  K 3.7 3.8  CL 104 105  CO2 22  --   GLUCOSE 164* 166*  BUN 24* 23  CREATININE 1.57* 1.50*  CALCIUM 9.1  --    GFR: CrCl cannot be calculated (Unknown ideal weight.). Liver Function Tests: Recent Labs  Lab 05/30/20 0755  AST 18  ALT 13  ALKPHOS 51  BILITOT 0.9  PROT 6.7  ALBUMIN 3.5   No results for input(s): LIPASE, AMYLASE in the last 168 hours. No results for input(s): AMMONIA in the last 168 hours. Coagulation Profile: Recent Labs  Lab 05/30/20 0755  INR 0.9   Cardiac Enzymes: No  results for input(s): CKTOTAL, CKMB, CKMBINDEX, TROPONINI in the last 168 hours. BNP (last 3 results) No results for input(s): PROBNP in the last 8760 hours. HbA1C: No results for input(s): HGBA1C in the last 72 hours. CBG: Recent Labs  Lab 05/30/20 0755 05/30/20 1951  GLUCAP 157* 153*   Lipid Profile: No results for input(s): CHOL, HDL, LDLCALC, TRIG, CHOLHDL, LDLDIRECT in the last 72 hours. Thyroid Function Tests: No results for input(s): TSH, T4TOTAL, FREET4, T3FREE, THYROIDAB in the last 72 hours. Anemia Panel: Recent Labs    05/30/20 0755  FERRITIN 53   Urine analysis:    Component Value Date/Time   COLORURINE YELLOW 05/30/2020 0942   APPEARANCEUR CLOUDY (A) 05/30/2020 0942   LABSPEC 1.009 05/30/2020 0942   PHURINE 7.0 05/30/2020 0942   GLUCOSEU 50 (A) 05/30/2020 0942   HGBUR NEGATIVE 05/30/2020 0942   BILIRUBINUR NEGATIVE 05/30/2020 0942   KETONESUR NEGATIVE 05/30/2020 0942   PROTEINUR >=300 (A) 05/30/2020 0942   NITRITE NEGATIVE 05/30/2020 0942   LEUKOCYTESUR NEGATIVE 05/30/2020 0942    Radiological Exams on Admission: MR ANGIO HEAD WO CONTRAST  Result Date: 05/30/2020 CLINICAL DATA:  Stroke, follow-up.  Expressive/receptive aphasia. EXAM: MRI HEAD WITHOUT CONTRAST MRA HEAD WITHOUT CONTRAST MRA NECK WITH AND WITHOUT CONTRAST TECHNIQUE: Multiplanar, multiecho pulse sequences of the brain and surrounding structures were obtained without intravenous contrast. Angiographic images of the Circle of Willis were obtained using MRA technique without intravenous contrast. Angiographic images of the neck were obtained using MRA technique with and without intravenous contrast. Carotid stenosis measurements (when applicable) are obtained utilizing NASCET criteria, using the distal internal carotid diameter as the denominator. COMPARISON:  CTA head/neck December 22, 2019. FINDINGS: MRI HEAD FINDINGS Brain: No acute infarction, hemorrhage, hydrocephalus, extra-axial collection or mass  lesion. Remote lacunar infarct in the left basal ganglia. Patchy white matter T2/FLAIR hyperintensity, most likely related to chronic microvascular ischemic disease. Vascular: There is absent flow related signal loss in the right transverse, sigmoid sinus and jugular bulb; however, this is favored related to slow flow given that on the postcontrast MRA of the neck the visualized dural sinuses in this region appear patent. Skull and upper cervical spine: Normal marrow signal. Sinuses/Orbits: Mild paranasal sinus mucosal thickening without air-fluid levels. Unremarkable orbits. MRA HEAD FINDINGS Motion limited evaluation. Anterior circulation: Bilateral  internal carotid arteries are patent. Left supraclinoid ICA narrowing appears similar to prior when comparing across modalities. Bilateral M1 and proximal M2 MCAs are patent without evidence of hemodynamically significant stenosis. Bilateral ACAs are patent proximally. More distal MCA/ACA evaluation is limited by motion. Posterior circulation: Bilateral visualized distal intradural vertebral arteries and the basilar artery are patent. Multifocal mild-to-moderate narrowing of the visualized distal intradural vertebral artery. Bilateral PCAs are patent. Suspected mild right P1 and left P2 PCA stenosis, likely similar to prior. MRA NECK FINDINGS Severely motion limited evaluation of the neck without visible occlusion. Suspected severe stenosis of the right ICA origin and left vertebral artery at its dural margin, poorly visualized on this study. IMPRESSION: 1. No evidence of acute infarct. Remote left basal ganglia infarct and chronic microvascular ischemic disease. 2. No evidence of large vessel occlusion. 3. Motion limited intracranial evaluation. Left supraclinoid ICA narrowing appears similar to prior when comparing across modalities. Additionally, similar mild narrowing of the right P1 PCA and left P2 PCA. 4. Severely motion limited evaluation of the neck without  visible occlusion. Suspected severe stenosis of the right ICA origin and left vertebral artery at its dural margin, poorly visualized on this study. Electronically Signed   By: Margaretha Sheffield MD   On: 05/30/2020 11:09   MR Angiogram Neck W or Wo Contrast  Result Date: 05/30/2020 CLINICAL DATA:  Stroke, follow-up.  Expressive/receptive aphasia. EXAM: MRI HEAD WITHOUT CONTRAST MRA HEAD WITHOUT CONTRAST MRA NECK WITH AND WITHOUT CONTRAST TECHNIQUE: Multiplanar, multiecho pulse sequences of the brain and surrounding structures were obtained without intravenous contrast. Angiographic images of the Circle of Willis were obtained using MRA technique without intravenous contrast. Angiographic images of the neck were obtained using MRA technique with and without intravenous contrast. Carotid stenosis measurements (when applicable) are obtained utilizing NASCET criteria, using the distal internal carotid diameter as the denominator. COMPARISON:  CTA head/neck December 22, 2019. FINDINGS: MRI HEAD FINDINGS Brain: No acute infarction, hemorrhage, hydrocephalus, extra-axial collection or mass lesion. Remote lacunar infarct in the left basal ganglia. Patchy white matter T2/FLAIR hyperintensity, most likely related to chronic microvascular ischemic disease. Vascular: There is absent flow related signal loss in the right transverse, sigmoid sinus and jugular bulb; however, this is favored related to slow flow given that on the postcontrast MRA of the neck the visualized dural sinuses in this region appear patent. Skull and upper cervical spine: Normal marrow signal. Sinuses/Orbits: Mild paranasal sinus mucosal thickening without air-fluid levels. Unremarkable orbits. MRA HEAD FINDINGS Motion limited evaluation. Anterior circulation: Bilateral internal carotid arteries are patent. Left supraclinoid ICA narrowing appears similar to prior when comparing across modalities. Bilateral M1 and proximal M2 MCAs are patent without evidence  of hemodynamically significant stenosis. Bilateral ACAs are patent proximally. More distal MCA/ACA evaluation is limited by motion. Posterior circulation: Bilateral visualized distal intradural vertebral arteries and the basilar artery are patent. Multifocal mild-to-moderate narrowing of the visualized distal intradural vertebral artery. Bilateral PCAs are patent. Suspected mild right P1 and left P2 PCA stenosis, likely similar to prior. MRA NECK FINDINGS Severely motion limited evaluation of the neck without visible occlusion. Suspected severe stenosis of the right ICA origin and left vertebral artery at its dural margin, poorly visualized on this study. IMPRESSION: 1. No evidence of acute infarct. Remote left basal ganglia infarct and chronic microvascular ischemic disease. 2. No evidence of large vessel occlusion. 3. Motion limited intracranial evaluation. Left supraclinoid ICA narrowing appears similar to prior when comparing across modalities. Additionally, similar mild narrowing  of the right P1 PCA and left P2 PCA. 4. Severely motion limited evaluation of the neck without visible occlusion. Suspected severe stenosis of the right ICA origin and left vertebral artery at its dural margin, poorly visualized on this study. Electronically Signed   By: Margaretha Sheffield MD   On: 05/30/2020 11:09   MR BRAIN WO CONTRAST  Result Date: 05/30/2020 CLINICAL DATA:  Stroke, follow-up.  Expressive/receptive aphasia. EXAM: MRI HEAD WITHOUT CONTRAST MRA HEAD WITHOUT CONTRAST MRA NECK WITH AND WITHOUT CONTRAST TECHNIQUE: Multiplanar, multiecho pulse sequences of the brain and surrounding structures were obtained without intravenous contrast. Angiographic images of the Circle of Willis were obtained using MRA technique without intravenous contrast. Angiographic images of the neck were obtained using MRA technique with and without intravenous contrast. Carotid stenosis measurements (when applicable) are obtained utilizing  NASCET criteria, using the distal internal carotid diameter as the denominator. COMPARISON:  CTA head/neck December 22, 2019. FINDINGS: MRI HEAD FINDINGS Brain: No acute infarction, hemorrhage, hydrocephalus, extra-axial collection or mass lesion. Remote lacunar infarct in the left basal ganglia. Patchy white matter T2/FLAIR hyperintensity, most likely related to chronic microvascular ischemic disease. Vascular: There is absent flow related signal loss in the right transverse, sigmoid sinus and jugular bulb; however, this is favored related to slow flow given that on the postcontrast MRA of the neck the visualized dural sinuses in this region appear patent. Skull and upper cervical spine: Normal marrow signal. Sinuses/Orbits: Mild paranasal sinus mucosal thickening without air-fluid levels. Unremarkable orbits. MRA HEAD FINDINGS Motion limited evaluation. Anterior circulation: Bilateral internal carotid arteries are patent. Left supraclinoid ICA narrowing appears similar to prior when comparing across modalities. Bilateral M1 and proximal M2 MCAs are patent without evidence of hemodynamically significant stenosis. Bilateral ACAs are patent proximally. More distal MCA/ACA evaluation is limited by motion. Posterior circulation: Bilateral visualized distal intradural vertebral arteries and the basilar artery are patent. Multifocal mild-to-moderate narrowing of the visualized distal intradural vertebral artery. Bilateral PCAs are patent. Suspected mild right P1 and left P2 PCA stenosis, likely similar to prior. MRA NECK FINDINGS Severely motion limited evaluation of the neck without visible occlusion. Suspected severe stenosis of the right ICA origin and left vertebral artery at its dural margin, poorly visualized on this study. IMPRESSION: 1. No evidence of acute infarct. Remote left basal ganglia infarct and chronic microvascular ischemic disease. 2. No evidence of large vessel occlusion. 3. Motion limited intracranial  evaluation. Left supraclinoid ICA narrowing appears similar to prior when comparing across modalities. Additionally, similar mild narrowing of the right P1 PCA and left P2 PCA. 4. Severely motion limited evaluation of the neck without visible occlusion. Suspected severe stenosis of the right ICA origin and left vertebral artery at its dural margin, poorly visualized on this study. Electronically Signed   By: Margaretha Sheffield MD   On: 05/30/2020 11:09   DG CHEST PORT 1 VIEW  Result Date: 05/30/2020 CLINICAL DATA:  Code stroke, expressive aphasia EXAM: PORTABLE CHEST 1 VIEW COMPARISON:  Portable exam 1151 hours compared to 07/14/2019 FINDINGS: Normal heart size, mediastinal contours, and pulmonary vascularity. Rotated to the RIGHT. Lungs clear. No infiltrate, pleural effusion, or pneumothorax. Bones demineralized. IMPRESSION: No acute abnormalities. Electronically Signed   By: Lavonia Dana M.D.   On: 05/30/2020 12:01   CT HEAD CODE STROKE WO CONTRAST  Result Date: 05/30/2020 CLINICAL DATA:  Code stroke.  Aphasia EXAM: CT HEAD WITHOUT CONTRAST TECHNIQUE: Contiguous axial images were obtained from the base of the skull through the vertex  without intravenous contrast. COMPARISON:  12/22/2019 FINDINGS: Brain: No acute intracranial hemorrhage, mass effect, or edema. Gray-white differentiation remains preserved. Chronic small vessel infarct of the left lentiform nucleus. New age-indeterminate small vessel infarct of the left putamen. Additional patchy hypoattenuation in the supratentorial white matter is nonspecific but may reflect stable mild chronic microvascular ischemic changes. Prominence of the ventricles and sulci reflects minor generalized parenchymal volume loss. There is no extra-axial collection. Vascular: No hyperdense vessel. There is intracranial atherosclerotic calcification at the skull base. Skull: Unremarkable. Sinuses/Orbits: No acute finding. Other: Mastoid air cells are clear. ASPECTS ([place]  Stroke Program Early CT Score) - Ganglionic level infarction (caudate, lentiform nuclei, internal capsule, insula, M1-M3 cortex): 7 - Supraganglionic infarction (M4-M6 cortex): 3 Total score (0-10 with 10 being normal): 10 IMPRESSION: No acute intracranial hemorrhage or definite acute infarction. Age-indeterminate small vessel infarct of the left putamen. Chronic microvascular ischemic changes. Chronic left basal ganglia infarct. These results were called by telephone at the time of interpretation on 05/30/2020 at 8:14 am to provider [facility] , who verbally acknowledged these results. Electronically Signed   By: Macy Mis M.D.   On: 05/30/2020 08:15    EKG: not available for review  Assessment/Plan Active Problems:   Hypertensive emergency   Hypertensive encephalopathy     Hypertensive emergency/hypertensive encephalopathy -patient initially presented with expressive aphasia, nausea and vomiting -markedly hypertensive on arrival -he received IV hydralazine in ED with improvement in BP -overall symptoms improved -seen by neurology -MRI imaging negative for infarct -continue on home medications -use hydralazine prn -continue aspirin  Covid infection -patient is vaccinated -chest xray is negative -appears to be asymptomatic -continue supportive treatment  DM -continue on sliding scale insulin  CKD stage 3b -creatinine is currently at baseline -continue to monitor  BPH -continue tamsulosin  HLD -continue statin    DVT prophylaxis: lovenox  Code Status: full code  Family Communication: discussed with daughter at the bedside  Disposition Plan: discharge home in AM if blood pressure remains stable  Consults called: neurology  Admission status: inpatient telemetry   Kathie Dike MD [facility]   If 7PM-7AM, please contact night-coverage [email or web address]   05/30/2020, 10:23 PM

## 2020-05-30 NOTE — ED Notes (Signed)
Date and time results received: 05/30/20 1100 (use smartphrase ".now" to insert current time)  Test:Covid Critical Value: Positive  Name of Provider Notified: Dr Langston Masker  Orders Received? Or Actions Taken?:NA

## 2020-05-30 NOTE — ED Provider Notes (Signed)
[facility] EMERGENCY DEPARTMENT Provider Note   CSN: [id number] Arrival date & time: 05/30/20  2035  An emergency department physician performed an initial assessment on this suspected stroke patient at 56.  History CC;  Difficulty speaking  Jonathan Avery is a 87 y.o. male w/ hx of diabetes, bilateral carotid stenosis s/p right endarterectomy in Aug 2021, MI, HTN, TIA, presenting to the ED with aphasia.  Patient called his neighbor and friend Mortimer Fries, who was present at the bedside, approximately 1 hour prior to arrival at 0700, complaining of difficulty speaking. On my exam the patient is unable to clarify exactly when his symptoms began, or whether he woke up with them.  He tells me "I am having a hard time understanding you."  His friend Mortimer Fries at bedside states, "He usually wakes up around 6 AM, and he called me around 7 AM telling me he couldn't speak."  The patient denies active headache.  He reports difficulty speaking.  Per medical record review, this is happened several times in the past, it is documented history of TIAs with expressive aphasia in the past.  He takes aspirin 81 mg daily, no other forms of A/C.  HPI     Past Medical History:  Diagnosis Date  . BPH (benign prostatic hypertrophy)   . Cancer ([place])    skin cancer - basal  . Carotid artery occlusion   . Diabetes ([place])    Type II  . High cholesterol   . Hypertension   . Myocardial infarction ([place])   . Peripheral vascular disease ([place])   . Stroke [facility])      documented 12/28/2019- ? TIA  " weeks 3 weeks ago, expresive aphasia lasted 2 -3 minutes."     Patient Active Problem List   Diagnosis Date Noted  . Hypertensive emergency 05/30/2020  . Asymptomatic carotid artery stenosis without infarction, right 12/29/2019  . Pancreatic mass   . Diabetes ([place]) 11/29/2017  . Essential hypertension 11/29/2017  . Dyslipidemia 11/29/2017  . Myocardial infarction ([place]) 11/29/2017  . BPH (benign prostatic hyperplasia)  11/29/2017  . SBO (small bowel obstruction) ([place]) 11/29/2017  . CKD (chronic kidney disease), stage III ([place]) 11/29/2017  . Hx of colonic polyps   . History of colonic polyps 10/10/2009    Past Surgical History:  Procedure Laterality Date  . CARDIAC CATHETERIZATION    . CHOLECYSTECTOMY  1973  . COLONOSCOPY  07/20/2006   Dr. [name]:Status post right hemicolectomy. Residual colonic mucosa appeared normal, normal rectum.   . COLONOSCOPY  2006/2007   sprawling villous adenoma at ileocecal valve  . COLONOSCOPY  2011   Dr. [name]: normal rectum, pancolonic diverticulosis, 2 diminutive polyps, with path benign polypoid colonic mucosa  . COLONOSCOPY N/A 01/02/2015   Procedure: COLONOSCOPY;  Surgeon: Daneil Dolin, MD;  Location: [facility] ENDO [facility]ITE;  Service: Endoscopy;  Laterality: N/A;  1245  . CORONARY ANGIOPLASTY    . ENDARTERECTOMY Right 12/29/2019   Procedure: ENDARTERECTOMY CAROTID;  Surgeon: [name], MD;  Location: [facility] OR;  Service: Vascular;  Laterality: Right;  . ESOPHAGOGASTRODUODENOSCOPY (EGD) WITH PROPOFOL N/A 01/13/2018   Procedure: ESOPHAGOGASTRODUODENOSCOPY (EGD) WITH PROPOFOL;  Surgeon: [name], MD;  Location: [facility] ENDOSCOPY;  Service: Endoscopy;  Laterality: N/A;  . EUS N/A 01/13/2018   Procedure: UPPER ENDOSCOPIC ULTRASOUND (EUS) RADIAL;  Surgeon: [name], MD;  Location: [facility] ENDOSCOPY;  Service: Endoscopy;  Laterality: N/A;  . FINE NEEDLE ASPIRATION N/A 01/13/2018   Procedure: FINE NEEDLE ASPIRATION (FNA) LINEAR;  Surgeon: [name], MD;  Location: [facility];  Service: Endoscopy;  Laterality: N/A;  . open hemicolectomy  2007   due to villous adenoma  . PATCH ANGIOPLASTY Right 12/29/2019   Procedure: RIGHT CAROTID PATCH ANGIOPLASTY USING HEMASHIELD PLATINUM FINESSE;  Surgeon: [name], MD;  Location: [facility] OR;  Service: Vascular;  Laterality: Right;       Family History  Problem Relation Age of Onset  . Ulcers Father   . Diabetes type II Brother   .  Diabetes type II Brother   . Colon cancer Neg Hx     Social History   Tobacco Use  . Smoking status: Former Smoker    Years: 10.00    Types: Cigars  . Smokeless tobacco: Never Used  Vaping Use  . Vaping Use: Never used  Substance Use Topics  . Alcohol use: No    Alcohol/week: 0.0 standard drinks  . Drug use: No    Home Medications Prior to Admission medications   Medication Sig Start Date End Date Taking? Authorizing Provider  amLODipine (NORVASC) 5 MG tablet Take 5 mg by mouth daily.  11/28/14   [provider]  aspirin 81 MG tablet Take 81 mg by mouth at bedtime.     [provider]  chlorthalidone (HYGROTON) 25 MG tablet Take 1 tablet (25 mg total) by mouth daily. 03/28/18 02/29/20  Arnoldo Lenis, MD  cloNIDine (CATAPRES) 0.1 MG tablet Take 0.1 mg by mouth 2 (two) times daily. 12/29/19   [provider]  diphenhydramine-acetaminophen (TYLENOL PM) 25-500 MG TABS tablet Take 1 tablet by mouth at bedtime as needed.    [provider]  hydrALAZINE (APRESOLINE) 100 MG tablet Take 1 tablet (100 mg total) by mouth 3 (three) times daily. Dose change. 05/28/20   Strader, Fransisco Hertz, PA-C  insulin aspart (NOVOLOG) 100 UNIT/ML FlexPen Inject 10 Units into the skin 2 (two) times daily.     [provider]  LANTUS SOLOSTAR 100 UNIT/ML Solostar Pen Inject 40 Units into the skin 2 (two) times daily.  11/27/14   [provider]  losartan (COZAAR) 50 MG tablet Take 50 mg by mouth daily.    [provider]  magnesium gluconate (MAGONATE) 500 MG tablet Take 500 mg by mouth daily.    [provider]  metFORMIN (GLUCOPHAGE) 500 MG tablet Take 500 mg by mouth daily with breakfast.    [provider]  metoprolol tartrate (LOPRESSOR) 50 MG tablet Take 1 tablet (50 mg total) by mouth 2 (two) times daily. 05/03/20 08/01/20  Arnoldo Lenis, MD  simvastatin (ZOCOR) 20 MG tablet Take 20 mg by mouth daily at 6 PM.  11/27/14    [provider]  tamsulosin (FLOMAX) 0.4 MG CAPS capsule Take 0.4 mg by mouth every evening.  11/05/14   [provider]  triamcinolone cream (KENALOG) 0.1 % Apply 1 application topically daily as needed (irritation).    [provider]    Allergies    Patient has no known allergies.  Review of Systems   Review of Systems  Unable to perform ROS: Mental status change (level 5 caveat)    Physical Exam Updated Vital Signs BP (!) 174/75   Pulse 86   Temp 98.2 F (36.8 C) (Oral)   Resp 19   Ht 5' 10"  (1.778 m)   SpO2 93%   BMI 34.32 kg/m   Physical Exam Vitals and nursing note reviewed.  Constitutional:      Appearance:  He is well-developed and well-nourished.  HENT:     Head: Normocephalic.     Comments: Possible right lower facial droop Eyes:     Extraocular Movements: Extraocular movements intact.     Conjunctiva/sclera: Conjunctivae normal.     Pupils: Pupils are equal, round, and reactive to light.  Cardiovascular:     Rate and Rhythm: Normal rate and regular rhythm.     Pulses: Normal pulses.  Pulmonary:     Effort: Pulmonary effort is normal. No respiratory distress.     Breath sounds: Normal breath sounds.  Abdominal:     Palpations: Abdomen is soft.     Tenderness: There is no abdominal tenderness.  Musculoskeletal:        General: No edema.     Cervical back: Neck supple.  Skin:    General: Skin is warm and dry.  Neurological:     Mental Status: He is alert.     GCS: GCS eye subscore is 4. GCS verbal subscore is 4. GCS motor subscore is 6.     Comments: Possible right lower facial droop Expressive aphasia, receptive aphasia +Pronator drift right arm Intermittently compliant with neuro exam  Psychiatric:        Mood and Affect: Mood and affect normal.     ED Results / Procedures / Treatments   Labs (all labs ordered are listed, but only abnormal results are displayed) Labs Reviewed  RESP PANEL BY RT-PCR (FLU A&B, COVID)  ARPGX2 - Abnormal; Notable for the following components:      Result Value   SARS Coronavirus 2 by RT PCR POSITIVE (*)    All other components within normal limits  COMPREHENSIVE METABOLIC PANEL - Abnormal; Notable for the following components:   Glucose, Bld 164 (*)    BUN 24 (*)    Creatinine, Ser 1.57 (*)    GFR, Estimated 42 (*)    All other components within normal limits  URINALYSIS, ROUTINE W REFLEX MICROSCOPIC - Abnormal; Notable for the following components:   APPearance CLOUDY (*)    Glucose, UA 50 (*)    Protein, ur >=300 (*)    Bacteria, UA RARE (*)    All other components within normal limits  I-STAT CHEM 8, ED - Abnormal; Notable for the following components:   Creatinine, Ser 1.50 (*)    Glucose, Bld 166 (*)    All other components within normal limits  CBG MONITORING, ED - Abnormal; Notable for the following components:   Glucose-Capillary 157 (*)    All other components within normal limits  ETHANOL  PROTIME-INR  APTT  CBC  DIFFERENTIAL  RAPID URINE DRUG SCREEN, HOSP PERFORMED  D-DIMER, QUANTITATIVE (NOT AT [facility])  C-REACTIVE PROTEIN  FERRITIN    EKG None  Radiology MR ANGIO HEAD WO CONTRAST  Result Date: 05/30/2020 CLINICAL DATA:  Stroke, follow-up.  Expressive/receptive aphasia. EXAM: MRI HEAD WITHOUT CONTRAST MRA HEAD WITHOUT CONTRAST MRA NECK WITH AND WITHOUT CONTRAST TECHNIQUE: Multiplanar, multiecho pulse sequences of the brain and surrounding structures were obtained without intravenous contrast. Angiographic images of the Circle of Willis were obtained using MRA technique without intravenous contrast. Angiographic images of the neck were obtained using MRA technique with and without intravenous contrast. Carotid stenosis measurements (when applicable) are obtained utilizing NASCET criteria, using the distal internal carotid diameter as the denominator. COMPARISON:  CTA head/neck December 22, 2019. FINDINGS: MRI HEAD FINDINGS Brain: No acute infarction,  hemorrhage, hydrocephalus, extra-axial collection or mass lesion. Remote lacunar infarct in the  left basal ganglia. Patchy white matter T2/FLAIR hyperintensity, most likely related to chronic microvascular ischemic disease. Vascular: There is absent flow related signal loss in the right transverse, sigmoid sinus and jugular bulb; however, this is favored related to slow flow given that on the postcontrast MRA of the neck the visualized dural sinuses in this region appear patent. Skull and upper cervical spine: Normal marrow signal. Sinuses/Orbits: Mild paranasal sinus mucosal thickening without air-fluid levels. Unremarkable orbits. MRA HEAD FINDINGS Motion limited evaluation. Anterior circulation: Bilateral internal carotid arteries are patent. Left supraclinoid ICA narrowing appears similar to prior when comparing across modalities. Bilateral M1 and proximal M2 MCAs are patent without evidence of hemodynamically significant stenosis. Bilateral ACAs are patent proximally. More distal MCA/ACA evaluation is limited by motion. Posterior circulation: Bilateral visualized distal intradural vertebral arteries and the basilar artery are patent. Multifocal mild-to-moderate narrowing of the visualized distal intradural vertebral artery. Bilateral PCAs are patent. Suspected mild right P1 and left P2 PCA stenosis, likely similar to prior. MRA NECK FINDINGS Severely motion limited evaluation of the neck without visible occlusion. Suspected severe stenosis of the right ICA origin and left vertebral artery at its dural margin, poorly visualized on this study. IMPRESSION: 1. No evidence of acute infarct. Remote left basal ganglia infarct and chronic microvascular ischemic disease. 2. No evidence of large vessel occlusion. 3. Motion limited intracranial evaluation. Left supraclinoid ICA narrowing appears similar to prior when comparing across modalities. Additionally, similar mild narrowing of the right P1 PCA and left P2 PCA. 4.  Severely motion limited evaluation of the neck without visible occlusion. Suspected severe stenosis of the right ICA origin and left vertebral artery at its dural margin, poorly visualized on this study. Electronically Signed   By: Margaretha Sheffield MD   On: 05/30/2020 11:09   MR Angiogram Neck W or Wo Contrast  Result Date: 05/30/2020 CLINICAL DATA:  Stroke, follow-up.  Expressive/receptive aphasia. EXAM: MRI HEAD WITHOUT CONTRAST MRA HEAD WITHOUT CONTRAST MRA NECK WITH AND WITHOUT CONTRAST TECHNIQUE: Multiplanar, multiecho pulse sequences of the brain and surrounding structures were obtained without intravenous contrast. Angiographic images of the Circle of Willis were obtained using MRA technique without intravenous contrast. Angiographic images of the neck were obtained using MRA technique with and without intravenous contrast. Carotid stenosis measurements (when applicable) are obtained utilizing NASCET criteria, using the distal internal carotid diameter as the denominator. COMPARISON:  CTA head/neck December 22, 2019. FINDINGS: MRI HEAD FINDINGS Brain: No acute infarction, hemorrhage, hydrocephalus, extra-axial collection or mass lesion. Remote lacunar infarct in the left basal ganglia. Patchy white matter T2/FLAIR hyperintensity, most likely related to chronic microvascular ischemic disease. Vascular: There is absent flow related signal loss in the right transverse, sigmoid sinus and jugular bulb; however, this is favored related to slow flow given that on the postcontrast MRA of the neck the visualized dural sinuses in this region appear patent. Skull and upper cervical spine: Normal marrow signal. Sinuses/Orbits: Mild paranasal sinus mucosal thickening without air-fluid levels. Unremarkable orbits. MRA HEAD FINDINGS Motion limited evaluation. Anterior circulation: Bilateral internal carotid arteries are patent. Left supraclinoid ICA narrowing appears similar to prior when comparing across modalities.  Bilateral M1 and proximal M2 MCAs are patent without evidence of hemodynamically significant stenosis. Bilateral ACAs are patent proximally. More distal MCA/ACA evaluation is limited by motion. Posterior circulation: Bilateral visualized distal intradural vertebral arteries and the basilar artery are patent. Multifocal mild-to-moderate narrowing of the visualized distal intradural vertebral artery. Bilateral PCAs are patent. Suspected mild right P1 and  left P2 PCA stenosis, likely similar to prior. MRA NECK FINDINGS Severely motion limited evaluation of the neck without visible occlusion. Suspected severe stenosis of the right ICA origin and left vertebral artery at its dural margin, poorly visualized on this study. IMPRESSION: 1. No evidence of acute infarct. Remote left basal ganglia infarct and chronic microvascular ischemic disease. 2. No evidence of large vessel occlusion. 3. Motion limited intracranial evaluation. Left supraclinoid ICA narrowing appears similar to prior when comparing across modalities. Additionally, similar mild narrowing of the right P1 PCA and left P2 PCA. 4. Severely motion limited evaluation of the neck without visible occlusion. Suspected severe stenosis of the right ICA origin and left vertebral artery at its dural margin, poorly visualized on this study. Electronically Signed   By: Margaretha Sheffield MD   On: 05/30/2020 11:09   MR BRAIN WO CONTRAST  Result Date: 05/30/2020 CLINICAL DATA:  Stroke, follow-up.  Expressive/receptive aphasia. EXAM: MRI HEAD WITHOUT CONTRAST MRA HEAD WITHOUT CONTRAST MRA NECK WITH AND WITHOUT CONTRAST TECHNIQUE: Multiplanar, multiecho pulse sequences of the brain and surrounding structures were obtained without intravenous contrast. Angiographic images of the Circle of Willis were obtained using MRA technique without intravenous contrast. Angiographic images of the neck were obtained using MRA technique with and without intravenous contrast. Carotid  stenosis measurements (when applicable) are obtained utilizing NASCET criteria, using the distal internal carotid diameter as the denominator. COMPARISON:  CTA head/neck December 22, 2019. FINDINGS: MRI HEAD FINDINGS Brain: No acute infarction, hemorrhage, hydrocephalus, extra-axial collection or mass lesion. Remote lacunar infarct in the left basal ganglia. Patchy white matter T2/FLAIR hyperintensity, most likely related to chronic microvascular ischemic disease. Vascular: There is absent flow related signal loss in the right transverse, sigmoid sinus and jugular bulb; however, this is favored related to slow flow given that on the postcontrast MRA of the neck the visualized dural sinuses in this region appear patent. Skull and upper cervical spine: Normal marrow signal. Sinuses/Orbits: Mild paranasal sinus mucosal thickening without air-fluid levels. Unremarkable orbits. MRA HEAD FINDINGS Motion limited evaluation. Anterior circulation: Bilateral internal carotid arteries are patent. Left supraclinoid ICA narrowing appears similar to prior when comparing across modalities. Bilateral M1 and proximal M2 MCAs are patent without evidence of hemodynamically significant stenosis. Bilateral ACAs are patent proximally. More distal MCA/ACA evaluation is limited by motion. Posterior circulation: Bilateral visualized distal intradural vertebral arteries and the basilar artery are patent. Multifocal mild-to-moderate narrowing of the visualized distal intradural vertebral artery. Bilateral PCAs are patent. Suspected mild right P1 and left P2 PCA stenosis, likely similar to prior. MRA NECK FINDINGS Severely motion limited evaluation of the neck without visible occlusion. Suspected severe stenosis of the right ICA origin and left vertebral artery at its dural margin, poorly visualized on this study. IMPRESSION: 1. No evidence of acute infarct. Remote left basal ganglia infarct and chronic microvascular ischemic disease. 2. No  evidence of large vessel occlusion. 3. Motion limited intracranial evaluation. Left supraclinoid ICA narrowing appears similar to prior when comparing across modalities. Additionally, similar mild narrowing of the right P1 PCA and left P2 PCA. 4. Severely motion limited evaluation of the neck without visible occlusion. Suspected severe stenosis of the right ICA origin and left vertebral artery at its dural margin, poorly visualized on this study. Electronically Signed   By: Margaretha Sheffield MD   On: 05/30/2020 11:09   DG CHEST PORT 1 VIEW  Result Date: 05/30/2020 CLINICAL DATA:  Code stroke, expressive aphasia EXAM: PORTABLE CHEST 1 VIEW COMPARISON:  Portable exam 1151 hours compared to 07/14/2019 FINDINGS: Normal heart size, mediastinal contours, and pulmonary vascularity. Rotated to the RIGHT. Lungs clear. No infiltrate, pleural effusion, or pneumothorax. Bones demineralized. IMPRESSION: No acute abnormalities. Electronically Signed   By: Lavonia Dana M.D.   On: 05/30/2020 12:01   CT HEAD CODE STROKE WO CONTRAST  Result Date: 05/30/2020 CLINICAL DATA:  Code stroke.  Aphasia EXAM: CT HEAD WITHOUT CONTRAST TECHNIQUE: Contiguous axial images were obtained from the base of the skull through the vertex without intravenous contrast. COMPARISON:  12/22/2019 FINDINGS: Brain: No acute intracranial hemorrhage, mass effect, or edema. Gray-white differentiation remains preserved. Chronic small vessel infarct of the left lentiform nucleus. New age-indeterminate small vessel infarct of the left putamen. Additional patchy hypoattenuation in the supratentorial white matter is nonspecific but may reflect stable mild chronic microvascular ischemic changes. Prominence of the ventricles and sulci reflects minor generalized parenchymal volume loss. There is no extra-axial collection. Vascular: No hyperdense vessel. There is intracranial atherosclerotic calcification at the skull base. Skull: Unremarkable. Sinuses/Orbits: No  acute finding. Other: Mastoid air cells are clear. ASPECTS ([place] Stroke Program Early CT Score) - Ganglionic level infarction (caudate, lentiform nuclei, internal capsule, insula, M1-M3 cortex): 7 - Supraganglionic infarction (M4-M6 cortex): 3 Total score (0-10 with 10 being normal): 10 IMPRESSION: No acute intracranial hemorrhage or definite acute infarction. Age-indeterminate small vessel infarct of the left putamen. Chronic microvascular ischemic changes. Chronic left basal ganglia infarct. These results were called by telephone at the time of interpretation on 05/30/2020 at 8:14 am to provider [facility] , who verbally acknowledged these results. Electronically Signed   By: Macy Mis M.D.   On: 05/30/2020 08:15    Procedures .Critical Care Performed by: Wyvonnia Dusky, MD Authorized by: Wyvonnia Dusky, MD   Critical care provider statement:    Critical care time (minutes):  45   Critical care was necessary to treat or prevent imminent or life-threatening deterioration of the following conditions:  CNS failure or compromise and circulatory failure   Critical care was time spent personally by me on the following activities:  Discussions with consultants, evaluation of patient's response to treatment, examination of patient, ordering and performing treatments and interventions, ordering and review of laboratory studies, ordering and review of radiographic studies, pulse oximetry, re-evaluation of patient's condition, obtaining history from patient or surrogate and review of old charts Comments:     Consideration for tPA - not given, in consultation with neurology Hypertensive emergency - IV antihypertensive medications given   (including critical care time)  Medications Ordered in ED Medications  ondansetron (ZOFRAN) injection 4 mg (has no administration in time range)  clevidipine (CLEVIPREX) infusion 0.5 mg/mL (has no administration in time range)  aspirin tablet 325 mg (325  mg Oral Given 05/30/20 0822)  hydrALAZINE (APRESOLINE) injection 10 mg (10 mg Intravenous Given 05/30/20 0852)  gadobutrol (GADAVIST) 1 MMOL/ML injection 10 mL (10 mLs Intravenous Contrast Given 05/30/20 0922)  ondansetron (ZOFRAN) injection 4 mg (4 mg Intravenous Given 05/30/20 0934)  hydrALAZINE (APRESOLINE) injection 20 mg (20 mg Intravenous Given 05/30/20 1031)    ED Course  I have reviewed the triage vital signs and the nursing notes.  Pertinent labs & imaging results that were available during my care of the patient were reviewed by me and considered in my medical decision making (see chart for details).  87 year old male presenting with expressive aphasia.  Onset possibly 1.5 hours ago according to initial report from friend Mikki Santee at bedside -  but cannot clarify onset with patient himself.  It is possible he woke up with these symptoms  HTN here - patient reports he DID take morning BP meds  Code stroke initiated, tele neuro consult, stat [facility] Labs pending  Prior medical records reviewed including last hospitalization, hx of endarectomy Supplemental history provided by neighbor/friend Mikki Santee at bedside Labs ordered and reviewed personally showing CKD with Cr near baseline at 1.5, K normal, UA without sign of evident infection, CBC unremarkable, Covid/flu PCR negative. CT head ordered by myself, reviewed showing no acute infarct Neurology consulted and history and workup discussed with them.   Neurology recommending MRI/MRA as noted below    Clinical Course as of 05/30/20 1220  Thu May 30, 2020  0758 Glucose-Capillary(!): 157 [MT]  2637 Neurologist recommending MRI brain, MRA head and neck stat - and to hold off on tPA at this time, but can reconsider if acute findings on MRI.  MRI technician called regarding priority scans. [MT]  [phone] Patient at MRI now.  10 mg hydralazine given, I've instructed nursing to give an addition 20 mg IV hydralazine on his return.  He is on large doses of anti HTN  meds and hasn't taken any yet today, but unfortunately is vomiting and an aspiration risk, and cannot take PO meds  I suspect the last two autodocumented elevations with DBP 205 & 167 were erroneous as he was actively vomiting during these reads. [MT]  [phone] Daughter now arrived and at bedside. [MT]  1029 I spoke again to Dr Theda Sers from neurology who reports he sees no evidence of acute infarct on MRI, and at this time does not recommend tPA.  I agree with this assessment.  The neurologist recommends admission for further medical management for possible hypertensive emergency - agreed [MT]  1039 BP now 175/87 upon return, his speech is improving although still slow.  He denies HA.  Nausea is gone. [MT]  1039 Patient reports to me that he DID take his morning BP meds today. [MT]  1100 Covid POSITIVE [MT]  1104 Patient reports he got 2 covid vaccines and a booster shot.  Now nauseous and vomiting again, shaking. [MT]  1142 Signed out to hospitalist  [MT]    Clinical Course User Index [MT] Aviel Davalos, Carola Rhine, MD    Final Clinical Impression(s) / ED Diagnoses Final diagnoses:  COVID-19  Expressive aphasia  Hypertension, unspecified type    Rx / DC Orders ED Discharge Orders    None       Wyvonnia Dusky, MD 05/30/20 1220

## 2020-05-30 NOTE — ED Triage Notes (Signed)
LKW at [facility]. Friend saw pt at [facility] last night and was normal  Pt called friend at 0720 this morning and pt found to have expressive aphasia

## 2020-05-31 DIAGNOSIS — I674 Hypertensive encephalopathy: Secondary | ICD-10-CM | POA: Diagnosis not present

## 2020-05-31 DIAGNOSIS — I161 Hypertensive emergency: Secondary | ICD-10-CM | POA: Diagnosis not present

## 2020-05-31 DIAGNOSIS — R4701 Aphasia: Secondary | ICD-10-CM | POA: Diagnosis not present

## 2020-05-31 DIAGNOSIS — U071 COVID-19: Secondary | ICD-10-CM | POA: Diagnosis not present

## 2020-05-31 LAB — HEMOGLOBIN A1C
Hgb A1c MFr Bld: 7.6 % — ABNORMAL HIGH (ref 4.8–5.6)
Mean Plasma Glucose: 171.42 mg/dL

## 2020-05-31 LAB — GLUCOSE, CAPILLARY
Glucose-Capillary: 140 mg/dL — ABNORMAL HIGH (ref 70–99)
Glucose-Capillary: 157 mg/dL — ABNORMAL HIGH (ref 70–99)

## 2020-05-31 MED ORDER — AMLODIPINE BESYLATE 5 MG PO TABS: 5.0000 mg | ORAL_TABLET | Freq: Every day | ORAL | 11 refills | Status: DC

## 2020-05-31 NOTE — Discharge Summary (Signed)
Physician Discharge Summary  Jonathan Avery BLT:[id number] DOB: 1930-11-08 DOA: 05/30/2020  PCP: Asencion Noble, MD  Admit date: 05/30/2020 Discharge date: 05/31/2020  Admitted From: Home Disposition: Home  Recommendations for Outpatient Follow-up:  1. Follow up with PCP in 1-2 weeks 2. Please obtain BMP/CBC in one week  Home Health: Equipment/Devices:  Discharge Condition: Stable CODE STATUS: Full code Diet recommendation: Heart healthy  Brief/Interim Summary: Jonathan Avery is a 87 y.o. male with medical history significant of HTN, CAD, DM2, presents to the hospital with aphasia. Patient was last known normal before he went to bed last night. He work up this morning and noted to have expressive aphasia. He denied any other unilateral weakness or numbness. No changes in vision or headache. He did have some nausea and vomiting. On arrival to ED, patient was noted to be hypertensive with sbp>200. He reports taking his medication this morning, He denies any chest pain or nausea. He has not had any fever, cough or shortness of breath. He was also noted to be covid positive.  Discharge Diagnoses:  Active Problems:   Hypertensive emergency   Hypertensive encephalopathy  Hypertensive emergency/hypertensive encephalopathy -patient initially presented with expressive aphasia, nausea and vomiting -markedly hypertensive on arrival -he received IV hydralazine in ED with improvement in BP -overall symptoms improved -seen by neurology -MRI imaging negative for infarct -continue on home medications -use hydralazine prn -continue aspirin -Overall blood pressures remained relatively stable throughout his hospital stay, although still elevated -Norvasc has been added to his medication regimen  Covid infection -patient is vaccinated -chest xray is negative -appears to be asymptomatic -continue supportive treatment  DM -Blood sugar stable -Continue home regimen on discharge  CKD stage  3b -creatinine is currently at baseline -continue to monitor  BPH -continue tamsulosin  HLD -continue statin  Discharge Instructions  Discharge Instructions    Diet - low sodium heart healthy   Complete by: As directed    Increase activity slowly   Complete by: As directed      Allergies as of 05/31/2020   No Known Allergies     Medication List    TAKE these medications   amLODipine 5 MG tablet Commonly known as: NORVASC Take 1 tablet (5 mg total) by mouth daily.   aspirin 81 MG tablet Take 81 mg by mouth at bedtime.   chlorthalidone 25 MG tablet Commonly known as: HYGROTON Take 1 tablet (25 mg total) by mouth daily.   cloNIDine 0.1 MG tablet Commonly known as: CATAPRES Take 0.1 mg by mouth 2 (two) times daily.   diphenhydramine-acetaminophen 25-500 MG Tabs tablet Commonly known as: TYLENOL PM Take 1 tablet by mouth at bedtime as needed (sleep).   hydrALAZINE 100 MG tablet Commonly known as: APRESOLINE Take 1 tablet (100 mg total) by mouth 3 (three) times daily. Dose change.   insulin aspart 100 UNIT/ML FlexPen Commonly known as: NOVOLOG Inject 10 Units into the skin 2 (two) times daily.   Lantus SoloStar 100 UNIT/ML Solostar Pen Generic drug: insulin glargine Inject 40 Units into the skin 2 (two) times daily.   losartan 50 MG tablet Commonly known as: COZAAR Take 100 mg by mouth daily.   magnesium gluconate 500 MG tablet Commonly known as: MAGONATE Take 500 mg by mouth daily.   metFORMIN 500 MG tablet Commonly known as: GLUCOPHAGE Take 500 mg by mouth in the morning and at bedtime.   metoprolol tartrate 50 MG tablet Commonly known as: LOPRESSOR Take 1 tablet (50 mg total)  by mouth 2 (two) times daily.   simvastatin 20 MG tablet Commonly known as: ZOCOR Take 20 mg by mouth daily at 6 PM.   tamsulosin 0.4 MG Caps capsule Commonly known as: FLOMAX Take 0.4 mg by mouth every evening.   triamcinolone 0.1 % Commonly known as: KENALOG Apply  1 application topically daily as needed (irritation).       No Known Allergies  Consultations:  Tele-neurology   Procedures/Studies: MR ANGIO HEAD WO CONTRAST  Result Date: 05/30/2020 CLINICAL DATA:  Stroke, follow-up.  Expressive/receptive aphasia. EXAM: MRI HEAD WITHOUT CONTRAST MRA HEAD WITHOUT CONTRAST MRA NECK WITH AND WITHOUT CONTRAST TECHNIQUE: Multiplanar, multiecho pulse sequences of the brain and surrounding structures were obtained without intravenous contrast. Angiographic images of the Circle of Willis were obtained using MRA technique without intravenous contrast. Angiographic images of the neck were obtained using MRA technique with and without intravenous contrast. Carotid stenosis measurements (when applicable) are obtained utilizing NASCET criteria, using the distal internal carotid diameter as the denominator. COMPARISON:  CTA head/neck December 22, 2019. FINDINGS: MRI HEAD FINDINGS Brain: No acute infarction, hemorrhage, hydrocephalus, extra-axial collection or mass lesion. Remote lacunar infarct in the left basal ganglia. Patchy white matter T2/FLAIR hyperintensity, most likely related to chronic microvascular ischemic disease. Vascular: There is absent flow related signal loss in the right transverse, sigmoid sinus and jugular bulb; however, this is favored related to slow flow given that on the postcontrast MRA of the neck the visualized dural sinuses in this region appear patent. Skull and upper cervical spine: Normal marrow signal. Sinuses/Orbits: Mild paranasal sinus mucosal thickening without air-fluid levels. Unremarkable orbits. MRA HEAD FINDINGS Motion limited evaluation. Anterior circulation: Bilateral internal carotid arteries are patent. Left supraclinoid ICA narrowing appears similar to prior when comparing across modalities. Bilateral M1 and proximal M2 MCAs are patent without evidence of hemodynamically significant stenosis. Bilateral ACAs are patent proximally. More  distal MCA/ACA evaluation is limited by motion. Posterior circulation: Bilateral visualized distal intradural vertebral arteries and the basilar artery are patent. Multifocal mild-to-moderate narrowing of the visualized distal intradural vertebral artery. Bilateral PCAs are patent. Suspected mild right P1 and left P2 PCA stenosis, likely similar to prior. MRA NECK FINDINGS Severely motion limited evaluation of the neck without visible occlusion. Suspected severe stenosis of the right ICA origin and left vertebral artery at its dural margin, poorly visualized on this study. IMPRESSION: 1. No evidence of acute infarct. Remote left basal ganglia infarct and chronic microvascular ischemic disease. 2. No evidence of large vessel occlusion. 3. Motion limited intracranial evaluation. Left supraclinoid ICA narrowing appears similar to prior when comparing across modalities. Additionally, similar mild narrowing of the right P1 PCA and left P2 PCA. 4. Severely motion limited evaluation of the neck without visible occlusion. Suspected severe stenosis of the right ICA origin and left vertebral artery at its dural margin, poorly visualized on this study. Electronically Signed   By: Margaretha Sheffield MD   On: 05/30/2020 11:09   MR Angiogram Neck W or Wo Contrast  Result Date: 05/30/2020 CLINICAL DATA:  Stroke, follow-up.  Expressive/receptive aphasia. EXAM: MRI HEAD WITHOUT CONTRAST MRA HEAD WITHOUT CONTRAST MRA NECK WITH AND WITHOUT CONTRAST TECHNIQUE: Multiplanar, multiecho pulse sequences of the brain and surrounding structures were obtained without intravenous contrast. Angiographic images of the Circle of Willis were obtained using MRA technique without intravenous contrast. Angiographic images of the neck were obtained using MRA technique with and without intravenous contrast. Carotid stenosis measurements (when applicable) are obtained utilizing NASCET criteria,  using the distal internal carotid diameter as the  denominator. COMPARISON:  CTA head/neck December 22, 2019. FINDINGS: MRI HEAD FINDINGS Brain: No acute infarction, hemorrhage, hydrocephalus, extra-axial collection or mass lesion. Remote lacunar infarct in the left basal ganglia. Patchy white matter T2/FLAIR hyperintensity, most likely related to chronic microvascular ischemic disease. Vascular: There is absent flow related signal loss in the right transverse, sigmoid sinus and jugular bulb; however, this is favored related to slow flow given that on the postcontrast MRA of the neck the visualized dural sinuses in this region appear patent. Skull and upper cervical spine: Normal marrow signal. Sinuses/Orbits: Mild paranasal sinus mucosal thickening without air-fluid levels. Unremarkable orbits. MRA HEAD FINDINGS Motion limited evaluation. Anterior circulation: Bilateral internal carotid arteries are patent. Left supraclinoid ICA narrowing appears similar to prior when comparing across modalities. Bilateral M1 and proximal M2 MCAs are patent without evidence of hemodynamically significant stenosis. Bilateral ACAs are patent proximally. More distal MCA/ACA evaluation is limited by motion. Posterior circulation: Bilateral visualized distal intradural vertebral arteries and the basilar artery are patent. Multifocal mild-to-moderate narrowing of the visualized distal intradural vertebral artery. Bilateral PCAs are patent. Suspected mild right P1 and left P2 PCA stenosis, likely similar to prior. MRA NECK FINDINGS Severely motion limited evaluation of the neck without visible occlusion. Suspected severe stenosis of the right ICA origin and left vertebral artery at its dural margin, poorly visualized on this study. IMPRESSION: 1. No evidence of acute infarct. Remote left basal ganglia infarct and chronic microvascular ischemic disease. 2. No evidence of large vessel occlusion. 3. Motion limited intracranial evaluation. Left supraclinoid ICA narrowing appears similar to prior  when comparing across modalities. Additionally, similar mild narrowing of the right P1 PCA and left P2 PCA. 4. Severely motion limited evaluation of the neck without visible occlusion. Suspected severe stenosis of the right ICA origin and left vertebral artery at its dural margin, poorly visualized on this study. Electronically Signed   By: Margaretha Sheffield MD   On: 05/30/2020 11:09   MR BRAIN WO CONTRAST  Result Date: 05/30/2020 CLINICAL DATA:  Stroke, follow-up.  Expressive/receptive aphasia. EXAM: MRI HEAD WITHOUT CONTRAST MRA HEAD WITHOUT CONTRAST MRA NECK WITH AND WITHOUT CONTRAST TECHNIQUE: Multiplanar, multiecho pulse sequences of the brain and surrounding structures were obtained without intravenous contrast. Angiographic images of the Circle of Willis were obtained using MRA technique without intravenous contrast. Angiographic images of the neck were obtained using MRA technique with and without intravenous contrast. Carotid stenosis measurements (when applicable) are obtained utilizing NASCET criteria, using the distal internal carotid diameter as the denominator. COMPARISON:  CTA head/neck December 22, 2019. FINDINGS: MRI HEAD FINDINGS Brain: No acute infarction, hemorrhage, hydrocephalus, extra-axial collection or mass lesion. Remote lacunar infarct in the left basal ganglia. Patchy white matter T2/FLAIR hyperintensity, most likely related to chronic microvascular ischemic disease. Vascular: There is absent flow related signal loss in the right transverse, sigmoid sinus and jugular bulb; however, this is favored related to slow flow given that on the postcontrast MRA of the neck the visualized dural sinuses in this region appear patent. Skull and upper cervical spine: Normal marrow signal. Sinuses/Orbits: Mild paranasal sinus mucosal thickening without air-fluid levels. Unremarkable orbits. MRA HEAD FINDINGS Motion limited evaluation. Anterior circulation: Bilateral internal carotid arteries are patent.  Left supraclinoid ICA narrowing appears similar to prior when comparing across modalities. Bilateral M1 and proximal M2 MCAs are patent without evidence of hemodynamically significant stenosis. Bilateral ACAs are patent proximally. More distal MCA/ACA evaluation is limited by  motion. Posterior circulation: Bilateral visualized distal intradural vertebral arteries and the basilar artery are patent. Multifocal mild-to-moderate narrowing of the visualized distal intradural vertebral artery. Bilateral PCAs are patent. Suspected mild right P1 and left P2 PCA stenosis, likely similar to prior. MRA NECK FINDINGS Severely motion limited evaluation of the neck without visible occlusion. Suspected severe stenosis of the right ICA origin and left vertebral artery at its dural margin, poorly visualized on this study. IMPRESSION: 1. No evidence of acute infarct. Remote left basal ganglia infarct and chronic microvascular ischemic disease. 2. No evidence of large vessel occlusion. 3. Motion limited intracranial evaluation. Left supraclinoid ICA narrowing appears similar to prior when comparing across modalities. Additionally, similar mild narrowing of the right P1 PCA and left P2 PCA. 4. Severely motion limited evaluation of the neck without visible occlusion. Suspected severe stenosis of the right ICA origin and left vertebral artery at its dural margin, poorly visualized on this study. Electronically Signed   By: Margaretha Sheffield MD   On: 05/30/2020 11:09   DG CHEST PORT 1 VIEW  Result Date: 05/30/2020 CLINICAL DATA:  Code stroke, expressive aphasia EXAM: PORTABLE CHEST 1 VIEW COMPARISON:  Portable exam 1151 hours compared to 07/14/2019 FINDINGS: Normal heart size, mediastinal contours, and pulmonary vascularity. Rotated to the RIGHT. Lungs clear. No infiltrate, pleural effusion, or pneumothorax. Bones demineralized. IMPRESSION: No acute abnormalities. Electronically Signed   By: Lavonia Dana M.D.   On: 05/30/2020 12:01    CT HEAD CODE STROKE WO CONTRAST  Result Date: 05/30/2020 CLINICAL DATA:  Code stroke.  Aphasia EXAM: CT HEAD WITHOUT CONTRAST TECHNIQUE: Contiguous axial images were obtained from the base of the skull through the vertex without intravenous contrast. COMPARISON:  12/22/2019 FINDINGS: Brain: No acute intracranial hemorrhage, mass effect, or edema. Gray-white differentiation remains preserved. Chronic small vessel infarct of the left lentiform nucleus. New age-indeterminate small vessel infarct of the left putamen. Additional patchy hypoattenuation in the supratentorial white matter is nonspecific but may reflect stable mild chronic microvascular ischemic changes. Prominence of the ventricles and sulci reflects minor generalized parenchymal volume loss. There is no extra-axial collection. Vascular: No hyperdense vessel. There is intracranial atherosclerotic calcification at the skull base. Skull: Unremarkable. Sinuses/Orbits: No acute finding. Other: Mastoid air cells are clear. ASPECTS ([place] Stroke Program Early CT Score) - Ganglionic level infarction (caudate, lentiform nuclei, internal capsule, insula, M1-M3 cortex): 7 - Supraganglionic infarction (M4-M6 cortex): 3 Total score (0-10 with 10 being normal): 10 IMPRESSION: No acute intracranial hemorrhage or definite acute infarction. Age-indeterminate small vessel infarct of the left putamen. Chronic microvascular ischemic changes. Chronic left basal ganglia infarct. These results were called by telephone at the time of interpretation on 05/30/2020 at 8:14 am to provider [facility] , who verbally acknowledged these results. Electronically Signed   By: Macy Mis M.D.   On: 05/30/2020 08:15       Subjective: Patient is feeling better today.  He does not have any headache, nausea, vomiting.  No speech issues.  Wants to go home  Discharge Exam: Vitals:   05/31/20 0222 05/31/20 0524 05/31/20 1520 05/31/20 1640  BP: (!) 151/58 (!) 171/63 (!)  197/77 (!) 162/98  Pulse: (!) 58 (!) 58 (!) 58 (!) 58  Resp: 18 16    Temp: 98 F (36.7 C) 97.8 F (36.6 C) 98.5 F (36.9 C)   TempSrc: Oral Oral Oral   SpO2: 96% 97% 91%   Weight:      Height:  General: Pt is alert, awake, not in acute distress Cardiovascular: RRR, S1/S2 +, no rubs, no gallops Respiratory: CTA bilaterally, no wheezing, no rhonchi Abdominal: Soft, NT, ND, bowel sounds + Extremities: no edema, no cyanosis    The results of significant diagnostics from this hospitalization (including imaging, microbiology, ancillary and laboratory) are listed below for reference.     Microbiology: Recent Results (from the past 240 hour(s))  Resp Panel by RT-PCR (Flu A&B, Covid) Nasopharyngeal Swab     Status: Abnormal   Collection Time: 05/30/20  7:56 AM   Specimen: Nasopharyngeal Swab; Nasopharyngeal(NP) swabs in vial transport medium  Result Value Ref Range Status   SARS Coronavirus 2 by RT PCR POSITIVE (A) NEGATIVE Final    Comment: RESULT CALLED TO, READ BACK BY AND VERIFIED WITH: HEATHER CRAWFORD @1100  05/30/20 BY JONES,T (NOTE) SARS-CoV-2 target nucleic acids are DETECTED.  The SARS-CoV-2 RNA is generally detectable in upper respiratory specimens during the acute phase of infection. Positive results are indicative of the presence of the identified virus, but do not rule out bacterial infection or co-infection with other pathogens not detected by the test. Clinical correlation with patient history and other diagnostic information is necessary to determine patient infection status. The expected result is Negative.  Fact Sheet for Patients: EntrepreneurPulse.com.au  Fact Sheet for Healthcare Providers: IncredibleEmployment.be  This test is not yet approved or cleared by the [place] FDA and  has been authorized for detection and/or diagnosis of SARS-CoV-2 by FDA under an Emergency Use Authorization (EUA).  This EUA  will remain in effect (meaning this test  can be used) for the duration of  the COVID-19 declaration under Section 564(b)(1) of the Act, 21 U.S.C. section 360bbb-3(b)(1), unless the authorization is terminated or revoked sooner.     Influenza A by PCR NEGATIVE NEGATIVE Final   Influenza B by PCR NEGATIVE NEGATIVE Final    Comment: (NOTE) The Xpert Xpress SARS-CoV-2/FLU/RSV plus assay is intended as an aid in the diagnosis of influenza from Nasopharyngeal swab specimens and should not be used as a sole basis for treatment. Nasal washings and aspirates are unacceptable for Xpert Xpress SARS-CoV-2/FLU/RSV testing.  Fact Sheet for Patients: EntrepreneurPulse.com.au  Fact Sheet for Healthcare Providers: IncredibleEmployment.be  This test is not yet approved or cleared by the [place] FDA and has been authorized for detection and/or diagnosis of SARS-CoV-2 by FDA under an Emergency Use Authorization (EUA). This EUA will remain in effect (meaning this test can be used) for the duration of the COVID-19 declaration under Section 564(b)(1) of the Act, 21 U.S.C. section 360bbb-3(b)(1), unless the authorization is terminated or revoked.  Performed at [facility], [address] [place], [place] [address]      Labs: BNP (last 3 results) No results for input(s): BNP in the last 8760 hours. Basic Metabolic Panel: Recent Labs  Lab 05/30/20 0755 05/30/20 0756  NA 136 137  K 3.7 3.8  CL 104 105  CO2 22  --   GLUCOSE 164* 166*  BUN 24* 23  CREATININE 1.57* 1.50*  CALCIUM 9.1  --    Liver Function Tests: Recent Labs  Lab 05/30/20 0755  AST 18  ALT 13  ALKPHOS 51  BILITOT 0.9  PROT 6.7  ALBUMIN 3.5   No results for input(s): LIPASE, AMYLASE in the last 168 hours. No results for input(s): AMMONIA in the last 168 hours. CBC: Recent Labs  Lab 05/30/20 0755 05/30/20 0756  WBC 9.4  --   NEUTROABS 5.4  --  HGB 15.3 14.3  HCT 45.2  42.0  MCV 88.5  --   PLT 173  --    Cardiac Enzymes: No results for input(s): CKTOTAL, CKMB, CKMBINDEX, TROPONINI in the last 168 hours. BNP: Invalid input(s): POCBNP CBG: Recent Labs  Lab 05/30/20 0755 05/30/20 1951 05/31/20 0730 05/31/20 1106  GLUCAP 157* 153* 140* 157*   D-Dimer Recent Labs    05/30/20 0755  DDIMER 1.18*   Hgb A1c Recent Labs    05/30/20 0755  HGBA1C 7.6*   Lipid Profile No results for input(s): CHOL, HDL, LDLCALC, TRIG, CHOLHDL, LDLDIRECT in the last 72 hours. Thyroid function studies No results for input(s): TSH, T4TOTAL, T3FREE, THYROIDAB in the last 72 hours.  Invalid input(s): FREET3 Anemia work up Recent Labs    05/30/20 0755  FERRITIN 53   Urinalysis    Component Value Date/Time   COLORURINE YELLOW 05/30/2020 0942   APPEARANCEUR CLOUDY (A) 05/30/2020 0942   LABSPEC 1.009 05/30/2020 0942   PHURINE 7.0 05/30/2020 0942   GLUCOSEU 50 (A) 05/30/2020 0942   HGBUR NEGATIVE 05/30/2020 0942   BILIRUBINUR NEGATIVE 05/30/2020 0942   KETONESUR NEGATIVE 05/30/2020 0942   PROTEINUR >=300 (A) 05/30/2020 0942   NITRITE NEGATIVE 05/30/2020 0942   LEUKOCYTESUR NEGATIVE 05/30/2020 0942   Sepsis Labs Invalid input(s): PROCALCITONIN,  WBC,  LACTICIDVEN Microbiology Recent Results (from the past 240 hour(s))  Resp Panel by RT-PCR (Flu A&B, Covid) Nasopharyngeal Swab     Status: Abnormal   Collection Time: 05/30/20  7:56 AM   Specimen: Nasopharyngeal Swab; Nasopharyngeal(NP) swabs in vial transport medium  Result Value Ref Range Status   SARS Coronavirus 2 by RT PCR POSITIVE (A) NEGATIVE Final    Comment: RESULT CALLED TO, READ BACK BY AND VERIFIED WITH: HEATHER CRAWFORD @1100  05/30/20 BY JONES,T (NOTE) SARS-CoV-2 target nucleic acids are DETECTED.  The SARS-CoV-2 RNA is generally detectable in upper respiratory specimens during the acute phase of infection. Positive results are indicative of the presence of the identified virus, but do not  rule out bacterial infection or co-infection with other pathogens not detected by the test. Clinical correlation with patient history and other diagnostic information is necessary to determine patient infection status. The expected result is Negative.  Fact Sheet for Patients: EntrepreneurPulse.com.au  Fact Sheet for Healthcare Providers: IncredibleEmployment.be  This test is not yet approved or cleared by the [place] FDA and  has been authorized for detection and/or diagnosis of SARS-CoV-2 by FDA under an Emergency Use Authorization (EUA).  This EUA will remain in effect (meaning this test  can be used) for the duration of  the COVID-19 declaration under Section 564(b)(1) of the Act, 21 U.S.C. section 360bbb-3(b)(1), unless the authorization is terminated or revoked sooner.     Influenza A by PCR NEGATIVE NEGATIVE Final   Influenza B by PCR NEGATIVE NEGATIVE Final    Comment: (NOTE) The Xpert Xpress SARS-CoV-2/FLU/RSV plus assay is intended as an aid in the diagnosis of influenza from Nasopharyngeal swab specimens and should not be used as a sole basis for treatment. Nasal washings and aspirates are unacceptable for Xpert Xpress SARS-CoV-2/FLU/RSV testing.  Fact Sheet for Patients: EntrepreneurPulse.com.au  Fact Sheet for Healthcare Providers: IncredibleEmployment.be  This test is not yet approved or cleared by the [place] FDA and has been authorized for detection and/or diagnosis of SARS-CoV-2 by FDA under an Emergency Use Authorization (EUA). This EUA will remain in effect (meaning this test can be used) for the duration of the COVID-19 declaration under  Section 564(b)(1) of the Act, 21 U.S.C. section 360bbb-3(b)(1), unless the authorization is terminated or revoked.  Performed at [facility], [address] [place], [place] [address]      Time coordinating discharge:  21mns  SIGNED:   JKathie Dike MD  [facility] 05/31/2020, 8:29 PM   If 7PM-7AM, please contact night-coverage [email or web address]

## 2020-05-31 NOTE — Care Management CC44 (Signed)
Condition Code 44 Documentation Completed  Patient Details  Name: Jonathan Avery MRN: [id number] Date of Birth: Jul 07, 1930   Condition Code 44 given:  Yes Patient signature on Condition Code 44 notice:  Yes Documentation of 2 MD's agreement:  Yes Code 44 added to claim:  Yes    Ihor Gully, LCSW 05/31/2020, 3:44 PM

## 2020-05-31 NOTE — Progress Notes (Signed)
SATURATION QUALIFICATIONS: (This note is used to comply with regulatory documentation for home oxygen)  Patient Saturations on Room Air at Rest = 97 %  Patient Saturations on Room Air while Ambulating = 96 %

## 2020-05-31 NOTE — Care Management Obs Status (Signed)
[place] NOTIFICATION   Patient Details  Name: Jonathan Avery MRN: [id number] Date of Birth: 1931/02/04   Medicare Observation Status Notification Given:  Yes    Ihor Gully, LCSW 05/31/2020, 3:45 PM

## 2020-06-07 ENCOUNTER — Other Ambulatory Visit: Payer: Medicare HMO

## 2020-06-07 DIAGNOSIS — Z20822 Contact with and (suspected) exposure to covid-19: Secondary | ICD-10-CM | POA: Diagnosis not present

## 2020-06-09 LAB — SARS-COV-2, NAA 2 DAY TAT

## 2020-06-09 LAB — NOVEL CORONAVIRUS, NAA: SARS-CoV-2, NAA: NOT DETECTED

## 2020-06-17 ENCOUNTER — Telehealth: Payer: Self-pay | Admitting: *Deleted

## 2020-06-17 DIAGNOSIS — Z0000 Encounter for general adult medical examination without abnormal findings: Secondary | ICD-10-CM | POA: Diagnosis not present

## 2020-06-17 MED ORDER — CLONIDINE HCL 0.2 MG PO TABS: 0.2000 mg | ORAL_TABLET | Freq: Two times a day (BID) | ORAL | 11 refills | Status: DC

## 2020-06-17 NOTE — Telephone Encounter (Signed)
-----   Message from Erma Heritage, [place] sent at 06/17/2020  2:33 PM EST ----- We are in a challenging spot as we cannot increase his Lopressor secondary to his heart rate and he is already on the maximum dose of Hydralazine and Losartan. Chlorthalidone is processed through the kidneys so it is not ideal to increase this either. If BP is still elevated, I would recommend increasing Clonidine to 0.37m BID. Can we also verify who is Nephrologist is because they had previously made changes as well and I want to make sure everyone is on the same page with medication adjustments.

## 2020-06-17 NOTE — Telephone Encounter (Signed)
Pt notified and order placed 

## 2020-06-18 NOTE — Progress Notes (Signed)
Pt sees Dr Melrose Nakayama at [facility]. [phone]

## 2020-06-20 DIAGNOSIS — R972 Elevated prostate specific antigen [PSA]: Secondary | ICD-10-CM | POA: Diagnosis not present

## 2020-06-20 DIAGNOSIS — I10 Essential (primary) hypertension: Secondary | ICD-10-CM | POA: Diagnosis not present

## 2020-06-20 DIAGNOSIS — G459 Transient cerebral ischemic attack, unspecified: Secondary | ICD-10-CM | POA: Diagnosis not present

## 2020-07-02 ENCOUNTER — Other Ambulatory Visit: Payer: Medicare HMO

## 2020-07-02 DIAGNOSIS — Z20822 Contact with and (suspected) exposure to covid-19: Secondary | ICD-10-CM | POA: Diagnosis not present

## 2020-07-03 LAB — NOVEL CORONAVIRUS, NAA: SARS-CoV-2, NAA: NOT DETECTED

## 2020-07-03 LAB — SARS-COV-2, NAA 2 DAY TAT

## 2020-07-05 ENCOUNTER — Telehealth: Payer: Self-pay | Admitting: Student

## 2020-07-05 NOTE — Telephone Encounter (Signed)
Pt notified and denies presyncope and dizziness at this time.

## 2020-07-05 NOTE — Telephone Encounter (Signed)
    Please let the patient know I reviewed his blood pressure log and readings have improved since Clonidine was titrated on 06/17/2020. His heart rate still remains in the 40's to 50's so please make sure he is not experiencing any dizziness or presyncope as I know he is still on Lopressor but we previously reduced his dose.  Signed, Erma Heritage, PA-C 07/05/2020, 4:58 PM Pager: [phone]

## 2020-07-08 ENCOUNTER — Encounter: Payer: Self-pay | Admitting: Student

## 2020-07-17 ENCOUNTER — Encounter: Payer: Self-pay | Admitting: Student

## 2020-08-05 ENCOUNTER — Encounter: Payer: Self-pay | Admitting: Student

## 2020-08-22 ENCOUNTER — Telehealth: Payer: Self-pay

## 2020-08-22 ENCOUNTER — Encounter: Payer: Self-pay | Admitting: Student

## 2020-08-22 NOTE — Telephone Encounter (Signed)
-----   Message from Erma Heritage, [place] sent at 08/22/2020 12:49 PM EDT ----- Please communicate with Alton Revere [phone] (contact listed on the BP log) and let him know the patient's BP continues to remain variable but overall improved as compared to 05/2020. HR remains low and is consistent with prior values. We will continue current regimen for now unless he develops any associated side effects. They do not have to return BP logs unless he is having issues or readings start to remain significantly elevated.

## 2020-08-22 NOTE — Telephone Encounter (Signed)
Spoke to Jonathan Avery who verbalized understanding of Jonathan Avery's result note. Patient will check daily bp's but will only send [place] bp log if bp's start trending upward or if he starts showing side effects. Jonathan Avery had no questions or concerns at this time.

## 2020-08-27 DIAGNOSIS — G459 Transient cerebral ischemic attack, unspecified: Secondary | ICD-10-CM | POA: Diagnosis not present

## 2020-08-27 DIAGNOSIS — R972 Elevated prostate specific antigen [PSA]: Secondary | ICD-10-CM | POA: Diagnosis not present

## 2020-08-27 DIAGNOSIS — I2510 Atherosclerotic heart disease of native coronary artery without angina pectoris: Secondary | ICD-10-CM | POA: Diagnosis not present

## 2020-08-27 DIAGNOSIS — N183 Chronic kidney disease, stage 3 unspecified: Secondary | ICD-10-CM | POA: Diagnosis not present

## 2020-08-27 DIAGNOSIS — I10 Essential (primary) hypertension: Secondary | ICD-10-CM | POA: Diagnosis not present

## 2020-08-27 DIAGNOSIS — E1129 Type 2 diabetes mellitus with other diabetic kidney complication: Secondary | ICD-10-CM | POA: Diagnosis not present

## 2020-08-27 DIAGNOSIS — Z79899 Other long term (current) drug therapy: Secondary | ICD-10-CM | POA: Diagnosis not present

## 2020-09-09 DIAGNOSIS — N1831 Chronic kidney disease, stage 3a: Secondary | ICD-10-CM | POA: Diagnosis not present

## 2020-09-09 DIAGNOSIS — R7309 Other abnormal glucose: Secondary | ICD-10-CM | POA: Diagnosis not present

## 2020-09-09 DIAGNOSIS — I10 Essential (primary) hypertension: Secondary | ICD-10-CM | POA: Diagnosis not present

## 2020-09-09 DIAGNOSIS — E1122 Type 2 diabetes mellitus with diabetic chronic kidney disease: Secondary | ICD-10-CM | POA: Diagnosis not present

## 2020-11-04 ENCOUNTER — Ambulatory Visit: Payer: Medicare HMO | Admitting: Vascular Surgery

## 2020-11-11 ENCOUNTER — Other Ambulatory Visit: Payer: Self-pay

## 2020-11-11 ENCOUNTER — Encounter: Payer: Self-pay | Admitting: Vascular Surgery

## 2020-11-11 ENCOUNTER — Ambulatory Visit (INDEPENDENT_AMBULATORY_CARE_PROVIDER_SITE_OTHER): Payer: Medicare HMO

## 2020-11-11 ENCOUNTER — Ambulatory Visit (INDEPENDENT_AMBULATORY_CARE_PROVIDER_SITE_OTHER): Payer: Medicare HMO | Admitting: Vascular Surgery

## 2020-11-11 ENCOUNTER — Other Ambulatory Visit (HOSPITAL_COMMUNITY): Payer: Self-pay | Admitting: Vascular Surgery

## 2020-11-11 VITALS — BP 156/73 | HR 41 | Temp 98.1°F | Resp 16 | Ht 70.0 in | Wt 215.0 lb

## 2020-11-11 DIAGNOSIS — I6523 Occlusion and stenosis of bilateral carotid arteries: Secondary | ICD-10-CM | POA: Diagnosis not present

## 2020-11-11 DIAGNOSIS — I6529 Occlusion and stenosis of unspecified carotid artery: Secondary | ICD-10-CM

## 2020-11-11 NOTE — Progress Notes (Signed)
[facility] [place]  Patient name: Jonathan Avery MRN: [id number] DOB: Mar 30, 1931 Sex: male  REASON FOR VISIT: Follow-up carotid disease  HPI: Jonathan Avery is a 87 y.o. male here today for follow-up.  He had undergone a right carotid endarterectomy for severe asymptomatic disease on 12/29/2019.  He had had a prior history of several episodes of expressive aphasia.  CT angiogram prior to his surgery suggested moderate left carotid disease and critical right carotid stenosis.  He did have a admission in January for an episode of expressive aphasia.  Has been placed on a new medication and was found to have blood pressure well over 200 at presentation and it was felt this was related to his speech issue.  He has had no difficulty since January.  He had no hemispheric right arm or leg symptoms at that time  Past Medical History:  Diagnosis Date   BPH (benign prostatic hypertrophy)    Cancer (HCC)    skin cancer - basal   Carotid artery occlusion    Diabetes (HCC)    Type II   High cholesterol    Hypertension    Myocardial infarction [facility])    Peripheral vascular disease ([place])    Stroke ([place])      documented 12/28/2019- ? TIA  " weeks 3 weeks ago, expresive aphasia lasted 2 -3 minutes."     Family History  Problem Relation Age of Onset   Ulcers Father    Diabetes type II Brother    Diabetes type II Brother    Colon cancer Neg Hx     SOCIAL HISTORY: Social History   Tobacco Use   Smoking status: Former    Pack years: 0.00    Types: Cigars   Smokeless tobacco: Never  Substance Use Topics   Alcohol use: No    Alcohol/week: 0.0 standard drinks    No Known Allergies  Current Outpatient Medications  Medication Sig Dispense Refill   amLODipine (NORVASC) 5 MG tablet Take 1 tablet (5 mg total) by mouth daily. 30 tablet 11   aspirin 81 MG tablet Take 81 mg by mouth at bedtime.      chlorthalidone (HYGROTON) 25 MG tablet Take  1 tablet (25 mg total) by mouth daily. 90 tablet 3   cloNIDine (CATAPRES) 0.2 MG tablet Take 1 tablet (0.2 mg total) by mouth 2 (two) times daily. 60 tablet 11   diphenhydramine-acetaminophen (TYLENOL PM) 25-500 MG TABS tablet Take 1 tablet by mouth at bedtime as needed (sleep).     hydrALAZINE (APRESOLINE) 100 MG tablet Take 1 tablet (100 mg total) by mouth 3 (three) times daily. Dose change. 180 tablet 5   insulin aspart (NOVOLOG) 100 UNIT/ML FlexPen Inject 10 Units into the skin 2 (two) times daily.      LANTUS SOLOSTAR 100 UNIT/ML Solostar Pen Inject 40 Units into the skin 2 (two) times daily.      magnesium gluconate (MAGONATE) 500 MG tablet Take 500 mg by mouth daily.     metFORMIN (GLUCOPHAGE) 500 MG tablet Take 500 mg by mouth in the morning and at bedtime.     olmesartan (BENICAR) 40 MG tablet Take 40 mg by mouth daily.     simvastatin (ZOCOR) 20 MG tablet Take 20 mg by mouth daily at 6 PM.      tamsulosin (FLOMAX) 0.4 MG CAPS capsule Take 0.4 mg by mouth every evening.      triamcinolone cream (KENALOG) 0.1 % Apply 1  application topically daily as needed (irritation).     metoprolol tartrate (LOPRESSOR) 50 MG tablet Take 1 tablet (50 mg total) by mouth 2 (two) times daily. 180 tablet 3   No current facility-administered medications for this visit.    REVIEW OF SYSTEMS:  [X]  denotes positive finding, [ ]  denotes negative finding Cardiac  Comments:  Chest pain or chest pressure:    Shortness of breath upon exertion:    Short of breath when lying flat:    Irregular heart rhythm:        Vascular    Pain in calf, thigh, or hip brought on by ambulation:    Pain in feet at night that wakes you up from your sleep:     Blood clot in your veins:    Leg swelling:           PHYSICAL EXAM: Vitals:   11/11/20 0846  BP: (!) 156/73  Pulse: (!) 41  Resp: 16  Temp: 98.1 F (36.7 C)  TempSrc: Other (Comment)  SpO2: 95%  Weight: 215 lb (97.5 kg)  Height: 5' 10"  (1.778 m)     GENERAL: The patient is a well-nourished male, in no acute distress. The vital signs are documented above. CARDIOVASCULAR: Right carotid incision well-healed he has no carotid bruits bilaterally.  2+ radial pulses bilaterally PULMONARY: There is good air exchange  MUSCULOSKELETAL: There are no major deformities or cyanosis. NEUROLOGIC: No focal weakness or paresthesias are detected. SKIN: There are no ulcers or rashes noted. PSYCHIATRIC: The patient has a normal affect.  DATA:  Carotid duplex today reveals widely patent right endarterectomy.  Left carotid predicted in the 40 to 59% stenosis range  MEDICAL ISSUES: Had long discussion with the patient and his family present.  Certainly concerning regarding his episode of expressive aphasia.  Most likely related to his severe hypotension.  He has had no progression of his moderate left carotid disease.  I again discussed symptoms of left carotid disease with him.  He will present immediately should this occur.  Otherwise we will see him again in 1 year with repeat carotid duplex    [name], MD FACS [facility] [phone]  Note: Portions of this report may have been transcribed using voice recognition software.  Every effort has been made to ensure accuracy; however, inadvertent computerized transcription errors may still be present.

## 2020-11-15 ENCOUNTER — Encounter: Payer: Self-pay | Admitting: Family Medicine

## 2020-11-20 ENCOUNTER — Other Ambulatory Visit: Payer: Self-pay

## 2020-11-20 ENCOUNTER — Ambulatory Visit
Admission: EM | Admit: 2020-11-20 | Discharge: 2020-11-20 | Disposition: A | Discharge status: Home / Self Care | Payer: Medicare HMO | Attending: Family Medicine | Admitting: Family Medicine

## 2020-11-20 ENCOUNTER — Encounter: Payer: Self-pay | Admitting: Emergency Medicine

## 2020-11-20 DIAGNOSIS — U071 COVID-19: Secondary | ICD-10-CM

## 2020-11-20 DIAGNOSIS — J069 Acute upper respiratory infection, unspecified: Secondary | ICD-10-CM | POA: Diagnosis not present

## 2020-11-20 MED ORDER — MOLNUPIRAVIR EUA 200MG CAPSULE: 4.0000 | ORAL_CAPSULE | Freq: Two times a day (BID) | ORAL | 0 refills | Status: AC

## 2020-11-20 MED ORDER — BENZONATATE 100 MG PO CAPS: 100.0000 mg | ORAL_CAPSULE | Freq: Three times a day (TID) | ORAL | 0 refills | Status: DC | PRN

## 2020-11-20 NOTE — Discharge Instructions (Addendum)
I have given you a paper prescription for molnupiravir. Take 4 capsules twice a day for 5 days  I have sent in tessalon perles for you to use one capsule every 8 hours as needed for cough.  Follow up with this office or with primary care if symptoms are persisting.  Follow up in the ER for high fever, trouble swallowing, trouble breathing, other concerning symptoms.

## 2020-11-20 NOTE — ED Provider Notes (Signed)
[place]   [id number] 11/20/20 Arrival Time: 1600   CC: COVID symptoms  SUBJECTIVE: History from: patient.  Jonathan Avery is a 87 y.o. male who presents with cough, fever for the last 4 days. Reports positive Covid exposure to family. Denies recent travel. Reports positive home Covid test x 4 days ago. Has positive history of Covid. Has completed Covid vaccines and one booster. Has not taken OTC medications for this. There are no aggravating or alleviating factors. Denies previous symptoms in the past. Denies sinus pain, rhinorrhea, sore throat, SOB, wheezing, chest pain, nausea, changes in bowel or bladder habits.    ROS: As per HPI.  All other pertinent ROS negative.     Past Medical History:  Diagnosis Date   BPH (benign prostatic hypertrophy)    Cancer (HCC)    skin cancer - basal   Carotid artery occlusion    Diabetes (HCC)    Type II   High cholesterol    Hypertension    Myocardial infarction [facility])    Peripheral vascular disease ([place])    Stroke ([place])      documented 12/28/2019- ? TIA  " weeks 3 weeks ago, expresive aphasia lasted 2 -3 minutes."    Past Surgical History:  Procedure Laterality Date   CARDIAC CATHETERIZATION     CHOLECYSTECTOMY  1973   COLONOSCOPY  07/20/2006   Dr. [name]:Status post right hemicolectomy. Residual colonic mucosa appeared normal, normal rectum.    COLONOSCOPY  2006/2007   sprawling villous adenoma at ileocecal valve   COLONOSCOPY  2011   Dr. [name]: normal rectum, pancolonic diverticulosis, 2 diminutive polyps, with path benign polypoid colonic mucosa   COLONOSCOPY N/A 01/02/2015   Procedure: COLONOSCOPY;  Surgeon: Daneil Dolin, MD;  Location: [facility]ITE;  Service: Endoscopy;  Laterality: N/A;  1245   CORONARY ANGIOPLASTY     ENDARTERECTOMY Right 12/29/2019   Procedure: ENDARTERECTOMY CAROTID;  Surgeon: [name], MD;  Location: [facility] OR;  Service: Vascular;  Laterality: Right;   ESOPHAGOGASTRODUODENOSCOPY (EGD) WITH  PROPOFOL N/A 01/13/2018   Procedure: ESOPHAGOGASTRODUODENOSCOPY (EGD) WITH PROPOFOL;  Surgeon: [name], MD;  Location: [facility] ENDOSCOPY;  Service: Endoscopy;  Laterality: N/A;   EUS N/A 01/13/2018   Procedure: UPPER ENDOSCOPIC ULTRASOUND (EUS) RADIAL;  Surgeon: [name], MD;  Location: [facility] ENDOSCOPY;  Service: Endoscopy;  Laterality: N/A;   FINE NEEDLE ASPIRATION N/A 01/13/2018   Procedure: FINE NEEDLE ASPIRATION (FNA) LINEAR;  Surgeon: [name], MD;  Location: [facility] ENDOSCOPY;  Service: Endoscopy;  Laterality: N/A;   open hemicolectomy  2007   due to villous adenoma   PATCH ANGIOPLASTY Right 12/29/2019   Procedure: RIGHT CAROTID PATCH ANGIOPLASTY USING HEMASHIELD PLATINUM FINESSE;  Surgeon: [name], MD;  Location: [facility] OR;  Service: Vascular;  Laterality: Right;   No Known Allergies No current facility-administered medications on file prior to encounter.   Current Outpatient Medications on File Prior to Encounter  Medication Sig Dispense Refill   amLODipine (NORVASC) 5 MG tablet Take 1 tablet (5 mg total) by mouth daily. 30 tablet 11   aspirin 81 MG tablet Take 81 mg by mouth at bedtime.      chlorthalidone (HYGROTON) 25 MG tablet Take 1 tablet (25 mg total) by mouth daily. 90 tablet 3   cloNIDine (CATAPRES) 0.2 MG tablet Take 1 tablet (0.2 mg total) by mouth 2 (two) times daily. 60 tablet 11   diphenhydramine-acetaminophen (TYLENOL PM) 25-500 MG TABS tablet Take 1 tablet by  mouth at bedtime as needed (sleep).     hydrALAZINE (APRESOLINE) 100 MG tablet Take 1 tablet (100 mg total) by mouth 3 (three) times daily. Dose change. 180 tablet 5   insulin aspart (NOVOLOG) 100 UNIT/ML FlexPen Inject 10 Units into the skin 2 (two) times daily.      LANTUS SOLOSTAR 100 UNIT/ML Solostar Pen Inject 40 Units into the skin 2 (two) times daily.      magnesium gluconate (MAGONATE) 500 MG tablet Take 500 mg by mouth daily.     metFORMIN (GLUCOPHAGE) 500 MG tablet Take 500 mg by mouth in the  morning and at bedtime.     metoprolol tartrate (LOPRESSOR) 50 MG tablet Take 1 tablet (50 mg total) by mouth 2 (two) times daily. 180 tablet 3   olmesartan (BENICAR) 40 MG tablet Take 40 mg by mouth daily.     simvastatin (ZOCOR) 20 MG tablet Take 20 mg by mouth daily at 6 PM.      tamsulosin (FLOMAX) 0.4 MG CAPS capsule Take 0.4 mg by mouth every evening.      triamcinolone cream (KENALOG) 0.1 % Apply 1 application topically daily as needed (irritation).     Social History   Socioeconomic History   Marital status: Widowed    Spouse name: Not on file   Number of children: Not on file   Years of education: Not on file   Highest education level: Not on file  Occupational History   Not on file  Tobacco Use   Smoking status: Former    Pack years: 0.00    Types: Cigars   Smokeless tobacco: Never  Vaping Use   Vaping Use: Never used  Substance and Sexual Activity   Alcohol use: No    Alcohol/week: 0.0 standard drinks   Drug use: No   Sexual activity: Not Currently  Other Topics Concern   Not on file  Social History Narrative   Not on file   Social Determinants of Health   Financial Resource Strain: Not on file  Food Insecurity: Not on file  Transportation Needs: Not on file  Physical Activity: Not on file  Stress: Not on file  Social Connections: Not on file  Intimate Partner Violence: Not on file   Family History  Problem Relation Age of Onset   Ulcers Father    Diabetes type II Brother    Diabetes type II Brother    Colon cancer Neg Hx     OBJECTIVE:  Vitals:   11/20/20 1609  BP: (!) 190/73  Pulse: (!) 54  Resp: 14  Temp: 99.4 F (37.4 C)  TempSrc: Tympanic  SpO2: 92%     General appearance: alert; appears fatigued, but nontoxic; speaking in full sentences and tolerating own secretions HEENT: NCAT; Ears: EACs clear, TMs pearly gray; Eyes: PERRL.  EOM grossly intact. Sinuses: nontender; Nose: nares patent with clear rhinorrhea, Throat: oropharynx  erythematous, cobblestoning present, tonsils non erythematous or enlarged, uvula midline  Neck: supple without LAD Lungs: unlabored respirations, symmetrical air entry; cough: mild; no respiratory distress; CTAB Heart: regular rate and rhythm.  Radial pulses 2+ symmetrical bilaterally Skin: warm and dry Psychological: alert and cooperative; normal mood and affect  LABS:  No results found for this or any previous visit (from the past 24 hour(s)).   ASSESSMENT & PLAN:  1. COVID-19 virus infection     Meds ordered this encounter  Medications   molnupiravir EUA 200 mg CAPS    Sig: Take 4 capsules (800  mg total) by mouth 2 (two) times daily for 5 days.    Dispense:  40 capsule    Refill:  0    Order Specific Question:   Supervising Provider    Answer:   Chase Picket [[id number]]   benzonatate (TESSALON PERLES) 100 MG capsule    Sig: Take 1 capsule (100 mg total) by mouth 3 (three) times daily as needed for cough.    Dispense:  20 capsule    Refill:  0    Order Specific Question:   Supervising Provider    Answer:   Chase Picket [[id number]]   Prescribed tessalon perles for cough prn Paper rx for molnupiravir provided given age, HTN, DM, and other risk factors Continue supportive care at home COVID testing ordered.  It will take between 2-3 days for test results. Someone will contact you regarding abnormal results.    Patient should remain in quarantine until they have received Covid results.  If negative you may resume normal activities (go back to work/school) while practicing hand hygiene, social distance, and mask wearing.  If positive, patient should remain in quarantine for at least 5 days from symptom onset AND greater than 72 hours after symptoms resolution (absence of fever without the use of fever-reducing medication and improvement in respiratory symptoms), whichever is longer Get plenty of rest and push fluids Use OTC zyrtec for nasal congestion, runny nose, and/or sore  throat Use OTC flonase for nasal congestion and runny nose Use medications daily for symptom relief Use OTC medications like ibuprofen or tylenol as needed fever or pain Call or go to the ED if you have any new or worsening symptoms such as fever, worsening cough, shortness of breath, chest tightness, chest pain, turning blue, changes in mental status.  Reviewed expectations re: course of current medical issues. Questions answered. Outlined signs and symptoms indicating need for more acute intervention. Patient verbalized understanding. After Visit Summary given.          Faustino Congress, NP 11/20/20 1654

## 2020-11-20 NOTE — ED Triage Notes (Signed)
Cough that started on Sunday, had fever on Tuesday but that has went away.  Had +at home covid test.  Has been around family members that tested positive for covid.

## 2020-11-21 LAB — SARS-COV-2, NAA 2 DAY TAT

## 2020-11-21 LAB — NOVEL CORONAVIRUS, NAA: SARS-CoV-2, NAA: DETECTED — AB

## 2020-11-28 DIAGNOSIS — Z20822 Contact with and (suspected) exposure to covid-19: Secondary | ICD-10-CM | POA: Diagnosis not present

## 2020-12-11 DIAGNOSIS — N1832 Chronic kidney disease, stage 3b: Secondary | ICD-10-CM | POA: Diagnosis not present

## 2020-12-11 DIAGNOSIS — I2510 Atherosclerotic heart disease of native coronary artery without angina pectoris: Secondary | ICD-10-CM | POA: Diagnosis not present

## 2020-12-11 DIAGNOSIS — D519 Vitamin B12 deficiency anemia, unspecified: Secondary | ICD-10-CM | POA: Diagnosis not present

## 2020-12-11 DIAGNOSIS — E785 Hyperlipidemia, unspecified: Secondary | ICD-10-CM | POA: Diagnosis not present

## 2020-12-11 DIAGNOSIS — E1122 Type 2 diabetes mellitus with diabetic chronic kidney disease: Secondary | ICD-10-CM | POA: Diagnosis not present

## 2021-01-13 NOTE — Progress Notes (Addendum)
Cardiology Office Note  Date: 01/21/2021   ID: Job, [place] Feb 11, 1931, MRN [id number]  PCP:  Asencion Noble, MD  Cardiologist:  Carlyle Dolly, MD Electrophysiologist:  None   Chief Complaint: Having BP problems  History of Present Illness: Jonathan Avery is a 87 y.o. male with a history of DM2, Carotid artery occlusion, HLD, HTN, CAD/MI, PVD,CVA.  He was last seen by Jonathan Avery, [name] on 02/29/2020.  He is being seen for possible cardioembolic event.  He had been seen by Dr. Harl Avery in November 2019 as a new patient for preop evaluation prior to her surgery for pancreatic mass.  He denied any recent chest pain but did report dyspnea on exertion when walking inclines.  Echo and stress test were ordered.  He had echo showed preserved EF of 55 to 60% with G1 DD and no WMA's.  He did have mild mitral regurgitation and mild aortic regurgitation.  Nuclear stress test showed large inferior wall infarct from apex to base with no current ischemia.   He had been referred to vascular surgery for carotid artery stenosis and underwent right CEA on August 2021 by Dr. Donnetta Hutching.  He reported expressive aphasia thought to be consistent consistent with TIAs and given cardiac history and concern for cardiac etiology follow-up was recommended.  During visit with Jonathan Avery he reported several episodes of expressive aphasia occurring earlier in the month.  Episodes last for several seconds then resolved.  He had no recurrent episodes of aphasia since that time but had experienced 10-minute episodes of vision loss and was referred for cardiac evaluation he denies any recent chest pain, DOE, palpitations, orthopnea, PND.  Had intermittent lower extremity edema and had been using compression stockings but found them hard to get on.  A 30-day cardiac monitor was ordered To rule out any significant arrhythmias.  He was continuing aspirin and statin therapy for carotid artery stenosis.  Blood pressure was elevated  but at clinic but well controlled at home.  He was continuing current medication regimen of amlodipine, chlorthalidone, clonidine, hydralazine, losartan, Lopressor.  He was continuing statin medication for hyperlipidemia.   He is here today with complaints of fatigue/no energy.  His son accompanies him.  He states he has been feeling fatigued for quite some time with no energy.  He is on multiple antihypertensive agents including; metoprolol 25 mg p.o. twice daily, olmesartan 40 mg daily, hydralazine 100 mg p.o. 3 times daily, clonidine 0.2 mg p.o. twice daily, chlorthalidone 25 mg p.o. daily, amlodipine 5 mg p.o. daily.  EKG today shows marked sinus bradycardia with a rate of 46.  His son states he recently was started on Crestor and was switched from another statin.  He denies any statin associated muscle symptoms.  He states the feelings of fatigue and no energy preceded starting the medication.  He states he does have vitamin B12 deficiency and is taking a vitamin B12 pill.  He denies any bleeding issues.  Denies any black tarry stools.  Denies any anginal symptoms.  No DOE or SOB.  No orthostatic symptoms.  Denies any PND.   Past Medical History:  Diagnosis Date   BPH (benign prostatic hypertrophy)    Cancer (HCC)    skin cancer - basal   Carotid artery occlusion    Diabetes (HCC)    Type II   High cholesterol    Hypertension    Myocardial infarction [facility])    Peripheral vascular disease ([place])    Stroke (  [place])      documented 12/28/2019- ? TIA  " weeks 3 weeks ago, expresive aphasia lasted 2 -3 minutes."     Past Surgical History:  Procedure Laterality Date   CARDIAC CATHETERIZATION     CHOLECYSTECTOMY  1973   COLONOSCOPY  07/20/2006   Jonathan Avery:Status post right hemicolectomy. Residual colonic mucosa appeared normal, normal rectum.    COLONOSCOPY  2006/2007   sprawling villous adenoma at ileocecal valve   COLONOSCOPY  2011   Jonathan Avery: normal rectum, pancolonic diverticulosis, 2  diminutive polyps, with path benign polypoid colonic mucosa   COLONOSCOPY N/A 01/02/2015   Procedure: COLONOSCOPY;  Surgeon: Daneil Dolin, MD;  Location: [facility];  Service: Endoscopy;  Laterality: N/A;  1245   CORONARY ANGIOPLASTY     ENDARTERECTOMY Right 12/29/2019   Procedure: ENDARTERECTOMY CAROTID;  Surgeon: Jonathan Posner, MD;  Location: [facility] OR;  Service: Vascular;  Laterality: Right;   ESOPHAGOGASTRODUODENOSCOPY (EGD) WITH PROPOFOL N/A 01/13/2018   Procedure: ESOPHAGOGASTRODUODENOSCOPY (EGD) WITH PROPOFOL;  Surgeon: Jonathan Banister, MD;  Location: [facility] ENDOSCOPY;  Service: Endoscopy;  Laterality: N/A;   EUS N/A 01/13/2018   Procedure: UPPER ENDOSCOPIC ULTRASOUND (EUS) RADIAL;  Surgeon: Jonathan Banister, MD;  Location: [facility] ENDOSCOPY;  Service: Endoscopy;  Laterality: N/A;   FINE NEEDLE ASPIRATION N/A 01/13/2018   Procedure: FINE NEEDLE ASPIRATION (FNA) LINEAR;  Surgeon: Jonathan Banister, MD;  Location: [facility] ENDOSCOPY;  Service: Endoscopy;  Laterality: N/A;   open hemicolectomy  2007   due to villous adenoma   PATCH ANGIOPLASTY Right 12/29/2019   Procedure: RIGHT CAROTID PATCH ANGIOPLASTY USING HEMASHIELD PLATINUM FINESSE;  Surgeon: Jonathan Posner, MD;  Location: [facility] OR;  Service: Vascular;  Laterality: Right;    Current Outpatient Medications  Medication Sig Dispense Refill   amLODipine (NORVASC) 5 MG tablet Take 1 tablet (5 mg total) by mouth daily. 30 tablet 11   aspirin 81 MG tablet Take 81 mg by mouth at bedtime.      chlorthalidone (HYGROTON) 25 MG tablet Take 1 tablet (25 mg total) by mouth daily. 90 tablet 3   cloNIDine (CATAPRES) 0.2 MG tablet Take 1 tablet (0.2 mg total) by mouth 2 (two) times daily. 60 tablet 11   diphenhydramine-acetaminophen (TYLENOL PM) 25-500 MG TABS tablet Take 1 tablet by mouth at bedtime as needed (sleep).     hydrALAZINE (APRESOLINE) 100 MG tablet Take 1 tablet (100 mg total) by mouth 3 (three) times daily. Dose change. 180 tablet 5   insulin aspart (NOVOLOG)  100 UNIT/ML FlexPen Inject 10 Units into the skin 2 (two) times daily.      LANTUS SOLOSTAR 100 UNIT/ML Solostar Pen Inject 40 Units into the skin 2 (two) times daily.      magnesium gluconate (MAGONATE) 500 MG tablet Take 500 mg by mouth daily.     metFORMIN (GLUCOPHAGE) 500 MG tablet Take 500 mg by mouth in the morning and at bedtime.     olmesartan (BENICAR) 40 MG tablet Take 40 mg by mouth daily.     rosuvastatin (CRESTOR) 10 MG tablet Take 10 mg by mouth daily.     tamsulosin (FLOMAX) 0.4 MG CAPS capsule Take 0.4 mg by mouth every evening.      No current facility-administered medications for this visit.   Allergies:  Patient has no known allergies.   Social History: The patient  reports that he has quit smoking. His smoking use included cigars. He has never used smokeless tobacco. He reports that  he does not drink alcohol and does not use drugs.   Family History: The patient's family history includes Diabetes type II in his brother and brother; Ulcers in his father.   ROS:  Please see the history of present illness. Otherwise, complete review of systems is positive for none.  All other systems are reviewed and negative.   Physical Exam: VS:  BP (!) 142/68   Pulse (!) 42   Ht 5' 10"  (1.778 m)   Wt 211 lb (95.7 kg)   SpO2 96%   BMI 30.28 kg/m , BMI Body mass index is 30.28 kg/m.  Wt Readings from Last 3 Encounters:  01/14/21 211 lb (95.7 kg)  11/11/20 215 lb (97.5 kg)  05/31/20 228 lb 6.3 oz (103.6 kg)    General: Patient appears comfortable at rest. Neck: Supple, no elevated JVP or carotid bruits, no thyromegaly. Lungs: Clear to auscultation, nonlabored breathing at rest. Cardiac: Bradycardic rate and rhythm, no S3 or significant systolic murmur, no pericardial rub. Extremities: No pitting edema, distal pulses 2+. Skin: Warm and dry. Musculoskeletal: No kyphosis. Neuropsychiatric: Alert and oriented x3, affect grossly appropriate.  ECG: 01/14/2021 EKG Marked sinus  bradycardia with a rate of 46, ST and T wave abnormality, consider inferior lateral ischemia.  Recent Labwork: 05/30/2020: ALT 13; AST 18; BUN 23; Creatinine, Ser 1.50; Hemoglobin 14.3; Platelets 173; Potassium 3.8; Sodium 137  No results found for: CHOL, TRIG, HDL, CHOLHDL, VLDL, LDLCALC, LDLDIRECT  Other Studies Reviewed Today:   NST: 03/2018 Blood pressure demonstrated a normal response to exercise. There was no ST segment deviation noted during stress. Findings consistent with prior myocardial infarction. This is an intermediate risk study. The left ventricular ejection fraction is moderately decreased (30-44%).   Large inferior wall infarct from apex to base with no ischemia EF 42%   Echocardiogram: 03/2018 Study Conclusions   - Left ventricle: The cavity size was normal. Wall thickness was    normal. Systolic function was normal. The estimated ejection    fraction was in the range of 55% to 60%. Wall motion was normal;    there were no regional wall motion abnormalities. Doppler    parameters are consistent with abnormal left ventricular    relaxation (grade 1 diastolic dysfunction).  - Aortic valve: Mildly calcified annulus. Trileaflet. There was    mild regurgitation.  - Mitral valve: Mildly calcified annulus. There was mild    regurgitation.  - Left atrium: The atrium was mildly dilated.  - Tricuspid valve: There was trivial regurgitation. Peak RV-RA    gradient (S): 29 mm Hg.  - Pulmonary arteries: Systolic pressure could not be accurately    estimated.  - Pericardium, extracardiac: There was no pericardial effusion.    Assessment and Plan:  1. Essential hypertension   2. Other fatigue   3. Bradycardia    1. Essential hypertension Patient is here with complaints of primarily low blood pressures.  He has a log of blood pressures which appear to be primarily running in the 062I to 948N systolic and 46E to 70J diastolic with low heart rates ranging from low 40s to  mid 50s.  He is on multiple antihypertensive agents including;, amlodipine 5 mg daily, chlorthalidone 25 mg daily, clonidine 0.2 mg p.o. twice daily, hydralazine 100 mg p.o. 3 times daily, metoprolol 25 mg p.o. twice daily, olmesartan 40 mg p.o. daily. We are going to stop metoprolol for now and see how he does given multiple antihypertensive agents.  He is bradycardic today with  a rate of 46.  2. Other fatigue Primarily complaining of fatigue and low energy.  States he has been feeling this way for a while.  We are going to stop metoprolol for now to see if his fatigue improves given multiple antihypertensive agents.  Also get lab work to include; CBC, basic metabolic panel, magnesium, thyroid panel, vitamin D, vitamin B12, FLP/LFT.  His son states he was recently switched to Crestor by PCP.  He denies any statin associated muscle symptoms.  He states the fatigue preceded starting the statin medication.  3.  Bradycardia We are stopping metoprolol for now due to marked sinus bradycardia and complaints of fatigue.  Advised patient to monitor blood pressure and heart rate and call us back in 2 weeks with results of blood pressures, heart rates, and report as to whether he is feeling better.  Medication Adjustments/Labs and Tests Ordered: Current medicines are reviewed at length with the patient today.  Concerns regarding medicines are outlined above.   Disposition: Follow-up with Dr. Harl Avery or [facility] 1 month  Signed, Levell July, NP 01/21/2021 5:12 PM    [facility], [place], [place] [address] Phone: [phone]; Fax: [phone]

## 2021-01-14 ENCOUNTER — Ambulatory Visit: Payer: Medicare HMO | Admitting: Family Medicine

## 2021-01-14 ENCOUNTER — Encounter: Payer: Self-pay | Admitting: Family Medicine

## 2021-01-14 VITALS — BP 142/68 | HR 42 | Ht 70.0 in | Wt 211.0 lb

## 2021-01-14 DIAGNOSIS — I2510 Atherosclerotic heart disease of native coronary artery without angina pectoris: Secondary | ICD-10-CM

## 2021-01-14 DIAGNOSIS — G459 Transient cerebral ischemic attack, unspecified: Secondary | ICD-10-CM

## 2021-01-14 DIAGNOSIS — E782 Mixed hyperlipidemia: Secondary | ICD-10-CM

## 2021-01-14 DIAGNOSIS — R5383 Other fatigue: Secondary | ICD-10-CM

## 2021-01-14 DIAGNOSIS — I6523 Occlusion and stenosis of bilateral carotid arteries: Secondary | ICD-10-CM

## 2021-01-14 DIAGNOSIS — R001 Bradycardia, unspecified: Secondary | ICD-10-CM

## 2021-01-14 DIAGNOSIS — I10 Essential (primary) hypertension: Secondary | ICD-10-CM | POA: Diagnosis not present

## 2021-01-14 NOTE — Patient Instructions (Addendum)
Medication Instructions:  Stop your Lopressor (Metoprolol tart.) Continue all other medications.     Labwork: CBC, CMET, TSH, Free T4, T3, HgA1c, Mg, FLP, Vit D, B12  Patient will call the office back to notify office of previous labs done at [facility] before giving orders today.   Testing/Procedures: none  Follow-Up: 1 month   Any Other Special Instructions Will Be Listed Below (If Applicable).  Please call the office in 2 weeks with update on how feeling & blood pressure / heart rate readings.   If you need a refill on your cardiac medications before your next appointment, please call your pharmacy.

## 2021-01-23 ENCOUNTER — Telehealth: Payer: Self-pay | Admitting: *Deleted

## 2021-01-23 NOTE — Telephone Encounter (Signed)
Spoke with Alton Revere (friend) - informed him of all instructions.  Stated that he has seen Nephrology at the [facility] & prefers to go back there for this.    Patient is not on Spironolactone.    Patient will be bringing his BP log by the office on 01/28/2021 for provider review.  He prefers to wait till we have review of these numbers before increasing the Norvasc.    Blood sugar is being managed by his pcp.

## 2021-01-23 NOTE — Telephone Encounter (Signed)
See labs scanned into epic from [place] - per Katina Dung, NP -   Refer to Nephrology [facility]) for CKD, Crt 20 / GFR - 31 2.  Stop Spironolactone for now.  3.  Increase Amlodipine to 89m daily for increased BP 4.  Blood sugar uncontrolled - A1c 8.0% - follow up with pcp for better control.

## 2021-01-28 NOTE — Telephone Encounter (Addendum)
Noted Placed on Jonathan Avery's desk for review

## 2021-01-28 NOTE — Telephone Encounter (Signed)
New message    [facility] faxed over Jonathan Avery BP readings this morning , he was just letting you know to check the fax machine

## 2021-01-30 ENCOUNTER — Telehealth: Payer: Self-pay | Admitting: *Deleted

## 2021-01-30 NOTE — Telephone Encounter (Signed)
-----   Message from [name]., NP sent at 01/30/2021  7:50 AM EDT ----- Regarding: [name] date of birth 23-Sep-1930 blood pressure log [name].  Looking at [name] blood pressure log it appears he is on multiple antihypertensive agents and still has elevated blood pressures.  Given that fact, we should probably refer him to hypertension clinic to see if they can get him straight on his blood pressure.  Go ahead him call him and his son and ask them if they would be willing to go to the hypertension clinic given the fact that he is on multiple medications with continued blood pressure elevations.  Thank you

## 2021-01-30 NOTE — Telephone Encounter (Signed)
Notified friend [name]) - he will discuss with him to see if this is something he would want to do.  Stated that he has been working on a flooded basement lately & noticed that it has stayed more elevated related to that stress.  Mikki Santee will call us back tomorrow with update.

## 2021-02-03 NOTE — Telephone Encounter (Signed)
1 mo f/u is scheduled for 02/14/2021.

## 2021-02-13 NOTE — Progress Notes (Signed)
Cardiology Office Note  Date: 02/14/2021   ID: Jonathan Avery [birth year], MRN [id number]  PCP:  Asencion Noble, MD  Cardiologist:  Carlyle Dolly, MD Electrophysiologist:  None   Chief Complaint: Having BP problems  History of Present Illness: Jonathan Avery is a 87 y.o. male with a history of DM2, Carotid artery occlusion, HLD, HTN, CAD/MI, PVD,CVA.  He was last seen by [name], [place] on 02/29/2020.  He is being seen for possible cardioembolic event.  He had been seen by [name] in November 2019 as a new patient for preop evaluation prior to her surgery for pancreatic mass.  He denied any recent chest pain but did report dyspnea on exertion when walking inclines.  Echo and stress test were ordered.  He had echo showed preserved EF of 55 to 60% with G1 DD and no WMA's.  He did have mild mitral regurgitation and mild aortic regurgitation.  Nuclear stress test showed large inferior wall infarct from apex to base with no current ischemia.   He had been referred to vascular surgery for carotid artery stenosis and underwent right CEA on August 2021 by Dr. Donnetta Hutching.  He reported expressive aphasia thought to be consistent consistent with TIAs and given cardiac history and concern for cardiac etiology follow-up was recommended.  During visit with [name] he reported several episodes of expressive aphasia occurring earlier in the month.  Episodes last for several seconds then resolved.  He had no recurrent episodes of aphasia since that time but had experienced 10-minute episodes of vision loss and was referred for cardiac evaluation he denies any recent chest pain, DOE, palpitations, orthopnea, PND.  Had intermittent lower extremity edema and had been using compression stockings but found them hard to get on.  A 30-day cardiac monitor was ordered To rule out any significant arrhythmias.  He was continuing aspirin and statin therapy for carotid artery stenosis.  Blood pressure was elevated  but at clinic but well controlled at home.  He was continuing current medication regimen of amlodipine, chlorthalidone, clonidine, hydralazine, losartan, Lopressor.  He was continuing statin medication for hyperlipidemia.  He was last here with complaints of fatigue and no energy.  EKG showed marked bradycardia with a heart rate of 46.  We discontinued his metoprolol at last visit.  Today he states this has made a significant difference.  Currently has no issues with fatigue and no energy.  His heart rate has increased.  He is feeling much better.  Currently denies any anginal symptoms, palpitations or arrhythmias, orthostatic symptoms.  Denies any PND, orthopnea.  He had some lab work back in June through the [place].Lipid profile: TC 142, TG 271, HDL 31, LDL 57, CBC showed hemoglobin 12.3 and hematocrit 35.7.  Urine creatinine random was 131, microalbumin quantitative was 178, microalbumin creatinine ratio 1358.8.  Renal function creatinine 2.06 and GFR 31.TSH 2.38, vitamin B12 178.  Denies any claudication-like symptoms, CVA or TIA-like symptoms.  His son states the nephrologist the [place] is keeping an eye on his renal function.   Past Medical History:  Diagnosis Date   BPH (benign prostatic hypertrophy)    Cancer (HCC)    skin cancer - basal   Carotid artery occlusion    Diabetes (HCC)    Type II   High cholesterol    Hypertension    Myocardial infarction [facility])    Peripheral vascular disease ([place])    Stroke ([place])      documented 12/28/2019- ? TIA  "  weeks 3 weeks ago, expresive aphasia lasted 2 -3 minutes."     Past Surgical History:  Procedure Laterality Date   CARDIAC CATHETERIZATION     CHOLECYSTECTOMY  1973   COLONOSCOPY  07/20/2006   Dr. [name]:Status post right hemicolectomy. Residual colonic mucosa appeared normal, normal rectum.    COLONOSCOPY  2006/2007   sprawling villous adenoma at ileocecal valve   COLONOSCOPY  2011   Dr. [name]: normal rectum, pancolonic diverticulosis, 2  diminutive polyps, with path benign polypoid colonic mucosa   COLONOSCOPY N/A 01/02/2015   Procedure: COLONOSCOPY;  Surgeon: Daneil Dolin, MD;  Location: [facility];  Service: Endoscopy;  Laterality: N/A;  1245   CORONARY ANGIOPLASTY     ENDARTERECTOMY Right 12/29/2019   Procedure: ENDARTERECTOMY CAROTID;  Surgeon: [name], MD;  Location: [facility] OR;  Service: Vascular;  Laterality: Right;   ESOPHAGOGASTRODUODENOSCOPY (EGD) WITH PROPOFOL N/A 01/13/2018   Procedure: ESOPHAGOGASTRODUODENOSCOPY (EGD) WITH PROPOFOL;  Surgeon: [name], MD;  Location: [facility] ENDOSCOPY;  Service: Endoscopy;  Laterality: N/A;   EUS N/A 01/13/2018   Procedure: UPPER ENDOSCOPIC ULTRASOUND (EUS) RADIAL;  Surgeon: [name], MD;  Location: [facility] ENDOSCOPY;  Service: Endoscopy;  Laterality: N/A;   FINE NEEDLE ASPIRATION N/A 01/13/2018   Procedure: FINE NEEDLE ASPIRATION (FNA) LINEAR;  Surgeon: [name], MD;  Location: [facility] ENDOSCOPY;  Service: Endoscopy;  Laterality: N/A;   open hemicolectomy  2007   due to villous adenoma   PATCH ANGIOPLASTY Right 12/29/2019   Procedure: RIGHT CAROTID PATCH ANGIOPLASTY USING HEMASHIELD PLATINUM FINESSE;  Surgeon: [name], MD;  Location: [facility] OR;  Service: Vascular;  Laterality: Right;    Current Outpatient Medications  Medication Sig Dispense Refill   amLODipine (NORVASC) 5 MG tablet Take 1 tablet (5 mg total) by mouth daily. 30 tablet 11   aspirin 81 MG tablet Take 81 mg by mouth at bedtime.      chlorthalidone (HYGROTON) 25 MG tablet Take 1 tablet (25 mg total) by mouth daily. 90 tablet 3   cloNIDine (CATAPRES) 0.2 MG tablet Take 1 tablet (0.2 mg total) by mouth 2 (two) times daily. 60 tablet 11   diphenhydramine-acetaminophen (TYLENOL PM) 25-500 MG TABS tablet Take 1 tablet by mouth at bedtime as needed (sleep).     hydrALAZINE (APRESOLINE) 100 MG tablet Take 1 tablet (100 mg total) by mouth 3 (three) times daily. Dose change. 180 tablet 5   insulin aspart (NOVOLOG)  100 UNIT/ML FlexPen Inject 10 Units into the skin 2 (two) times daily.      LANTUS SOLOSTAR 100 UNIT/ML Solostar Pen Inject 40 Units into the skin 2 (two) times daily.      magnesium gluconate (MAGONATE) 500 MG tablet Take 500 mg by mouth daily.     metFORMIN (GLUCOPHAGE) 500 MG tablet Take 500 mg by mouth in the morning and at bedtime.     olmesartan (BENICAR) 40 MG tablet Take 40 mg by mouth daily.     rosuvastatin (CRESTOR) 10 MG tablet Take 10 mg by mouth daily.     tamsulosin (FLOMAX) 0.4 MG CAPS capsule Take 0.4 mg by mouth every evening.      No current facility-administered medications for this visit.   Allergies:  Patient has no known allergies.   Social History: The patient  reports that he has quit smoking. His smoking use included cigars. He has never used smokeless tobacco. He reports that he does not drink alcohol and does not use drugs.  Family History: The patient's family history includes Diabetes type II in his brother and brother; Ulcers in his father.   ROS:  Please see the history of present illness. Otherwise, complete review of systems is positive for none.  All other systems are reviewed and negative.   Physical Exam: VS:  BP (!) 162/74   Pulse 63   Ht 5' 10"  (1.778 m)   Wt 211 lb 6.4 oz (95.9 kg)   SpO2 96%   BMI 30.33 kg/m , BMI Body mass index is 30.33 kg/m.  Wt Readings from Last 3 Encounters:  02/14/21 211 lb 6.4 oz (95.9 kg)  01/14/21 211 lb (95.7 kg)  11/11/20 215 lb (97.5 kg)    General: Patient appears comfortable at rest. Neck: Supple, no elevated JVP or carotid bruits, no thyromegaly. Lungs: Clear to auscultation, nonlabored breathing at rest. Cardiac: Bradycardic rate and rhythm, no S3 or significant systolic murmur, no pericardial rub. Extremities: No pitting edema, distal pulses 2+. Skin: Warm and dry. Musculoskeletal: No kyphosis. Neuropsychiatric: Alert and oriented x3, affect grossly appropriate.  ECG: 01/14/2021 EKG Marked sinus  bradycardia with a rate of 46, ST and T wave abnormality, consider inferior lateral ischemia.  Recent Labwork: 05/30/2020: ALT 13; AST 18; BUN 23; Creatinine, Ser 1.50; Hemoglobin 14.3; Platelets 173; Potassium 3.8; Sodium 137  No results found for: CHOL, TRIG, HDL, CHOLHDL, VLDL, LDLCALC, LDLDIRECT  Other Studies Reviewed Today:   NST: 03/2018 Blood pressure demonstrated a normal response to exercise. There was no ST segment deviation noted during stress. Findings consistent with prior myocardial infarction. This is an intermediate risk study. The left ventricular ejection fraction is moderately decreased (30-44%).   Large inferior wall infarct from apex to base with no ischemia EF 42%   Echocardiogram: 03/2018 Study Conclusions   - Left ventricle: The cavity size was normal. Wall thickness was    normal. Systolic function was normal. The estimated ejection    fraction was in the range of 55% to 60%. Wall motion was normal;    there were no regional wall motion abnormalities. Doppler    parameters are consistent with abnormal left ventricular    relaxation (grade 1 diastolic dysfunction).  - Aortic valve: Mildly calcified annulus. Trileaflet. There was    mild regurgitation.  - Mitral valve: Mildly calcified annulus. There was mild    regurgitation.  - Left atrium: The atrium was mildly dilated.  - Tricuspid valve: There was trivial regurgitation. Peak RV-RA    gradient (S): 29 mm Hg.  - Pulmonary arteries: Systolic pressure could not be accurately    estimated.  - Pericardium, extracardiac: There was no pericardial effusion.    Assessment and Plan:  1. Essential hypertension   2. Other fatigue   3. Bradycardia     1. Essential hypertension Has been elevated since stopping metoprolol.  We had stopped metoprolol prior due to fatigue and slow heart rate.  He brings with him a log of blood pressures which seem to be running in the 409W systolic consistently.  Please  increase amlodipine 5 mg daily to twice daily.  Continue chlorthalidone 25 mg daily, clonidine 0.2 mg p.o. twice daily, hydralazine 100 mg p.o. 3 times daily, olmesartan 40 mg p.o. daily.  Continue to monitor your blood pressures.  Goal is 130/80 consistently.   2. Other fatigue At last visit he complained of significant fatigue and slow heart rates.  We stopped metoprolol and patient states he is feeling much better since starting the metoprolol.  He denies any current fatigue.  Heart rate is 63 today.  States his energy is much better  3.  Bradycardia Heart rate 63 today after stopping metoprolol at last visit.  Prior to stopping metoprolol heart rate was in the mid 40 range..  States he is feeling much better since stopping the beta-blocker.  Medication Adjustments/Labs and Tests Ordered: Current medicines are reviewed at length with the patient today.  Concerns regarding medicines are outlined above.   Disposition: Follow-up with [name] or APP 6 months  Signed, Levell July, NP 02/14/2021 9:51 AM    [facility] [place], [place], [place] [address] Phone: [phone]; Fax: [phone]

## 2021-02-14 ENCOUNTER — Ambulatory Visit: Payer: Medicare HMO | Admitting: Family Medicine

## 2021-02-14 ENCOUNTER — Encounter: Payer: Self-pay | Admitting: Family Medicine

## 2021-02-14 VITALS — BP 162/74 | HR 63 | Ht 70.0 in | Wt 211.4 lb

## 2021-02-14 DIAGNOSIS — R001 Bradycardia, unspecified: Secondary | ICD-10-CM

## 2021-02-14 DIAGNOSIS — R5383 Other fatigue: Secondary | ICD-10-CM

## 2021-02-14 DIAGNOSIS — I10 Essential (primary) hypertension: Secondary | ICD-10-CM

## 2021-02-14 MED ORDER — AMLODIPINE BESYLATE 5 MG PO TABS: 5.0000 mg | ORAL_TABLET | Freq: Two times a day (BID) | ORAL | 3 refills | Status: DC

## 2021-02-14 NOTE — Patient Instructions (Signed)
Medication Instructions:   Increase Norvasc to 5 mg Two Times Daily   *If you need a refill on your cardiac medications before your next appointment, please call your pharmacy*   Lab Work: NONE   If you have labs (blood work) drawn today and your tests are completely normal, you will receive your results only by: [facility] (if you have MyChart) OR A paper copy in the mail If you have any lab test that is abnormal or we need to change your treatment, we will call you to review the results.   Testing/Procedures: NONE    Follow-Up: At [facility], you and your health needs are our priority.  As part of our continuing mission to provide you with exceptional heart care, we have created designated Provider Care Teams.  These Care Teams include your primary Cardiologist (physician) and Advanced Practice Providers (APPs -  Physician Assistants and Nurse Practitioners) who all work together to provide you with the care you need, when you need it.  We recommend signing up for the patient portal called "MyChart".  Sign up information is provided on this After Visit Summary.  MyChart is used to connect with patients for Virtual Visits (Telemedicine).  Patients are able to view lab/test results, encounter notes, upcoming appointments, etc.  Non-urgent messages can be sent to your provider as well.   To learn more about what you can do with MyChart, go to [email or web address].    Your next appointment:   6 month(s)  The format for your next appointment:   In Person  Provider:   Carlyle Dolly, MD   Other Instructions Thank you for choosing [facility]!

## 2021-03-10 ENCOUNTER — Ambulatory Visit: Payer: Medicare HMO | Admitting: Family Medicine

## 2021-03-10 ENCOUNTER — Telehealth: Payer: Self-pay | Admitting: Family Medicine

## 2021-03-10 ENCOUNTER — Emergency Department (HOSPITAL_COMMUNITY): Payer: Medicare HMO

## 2021-03-10 ENCOUNTER — Encounter (HOSPITAL_COMMUNITY): Payer: Self-pay

## 2021-03-10 ENCOUNTER — Other Ambulatory Visit: Payer: Self-pay

## 2021-03-10 ENCOUNTER — Emergency Department (HOSPITAL_COMMUNITY)
Admission: EM | Admit: 2021-03-10 | Discharge: 2021-03-10 | Disposition: A | Discharge status: Home / Self Care | Payer: Medicare HMO | Attending: Emergency Medicine | Admitting: Emergency Medicine

## 2021-03-10 DIAGNOSIS — I10 Essential (primary) hypertension: Secondary | ICD-10-CM | POA: Diagnosis not present

## 2021-03-10 DIAGNOSIS — Z7982 Long term (current) use of aspirin: Secondary | ICD-10-CM | POA: Insufficient documentation

## 2021-03-10 DIAGNOSIS — Z87891 Personal history of nicotine dependence: Secondary | ICD-10-CM | POA: Diagnosis not present

## 2021-03-10 DIAGNOSIS — Z794 Long term (current) use of insulin: Secondary | ICD-10-CM | POA: Diagnosis not present

## 2021-03-10 DIAGNOSIS — Z79899 Other long term (current) drug therapy: Secondary | ICD-10-CM | POA: Insufficient documentation

## 2021-03-10 LAB — TROPONIN I (HIGH SENSITIVITY)
Troponin I (High Sensitivity): 30 ng/L — ABNORMAL HIGH (ref <18)
Troponin I (High Sensitivity): 26 ng/L — ABNORMAL HIGH (ref <18)

## 2021-03-10 LAB — BASIC METABOLIC PANEL
Anion gap: 11 (ref 5–15)
BUN: 29 mg/dL — ABNORMAL HIGH (ref 8–23)
CO2: 18 mmol/L — ABNORMAL LOW (ref 22–32)
Calcium: 9.5 mg/dL (ref 8.9–10.3)
Chloride: 104 mmol/L (ref 98–111)
Creatinine, Ser: 1.48 mg/dL — ABNORMAL HIGH (ref 0.61–1.24)
GFR, Estimated: 45 mL/min — ABNORMAL LOW (ref >60)
Glucose, Bld: 187 mg/dL — ABNORMAL HIGH (ref 70–99)
Potassium: 3.9 mmol/L (ref 3.5–5.1)
Sodium: 133 mmol/L — ABNORMAL LOW (ref 135–145)

## 2021-03-10 LAB — CBC
HCT: 42.5 % (ref 39.0–52.0)
Hemoglobin: 14.6 g/dL (ref 13.0–17.0)
MCH: 31.3 pg (ref 26.0–34.0)
MCHC: 34.4 g/dL (ref 30.0–36.0)
MCV: 91 fL (ref 80.0–100.0)
Platelets: 186 10*3/uL (ref 150–400)
RBC: 4.67 MIL/uL (ref 4.22–5.81)
RDW: 12.7 % (ref 11.5–15.5)
WBC: 11.1 10*3/uL — ABNORMAL HIGH (ref 4.0–10.5)
nRBC: 0 % (ref 0.0–0.2)

## 2021-03-10 MED ORDER — AMLODIPINE BESYLATE 5 MG PO TABS
5.0000 mg | ORAL_TABLET | Freq: Once | ORAL | Status: AC
  Administered 2021-03-10: 5 mg via ORAL
  Filled 2021-03-10: qty 1

## 2021-03-10 MED ORDER — HYDRALAZINE HCL 25 MG PO TABS
100.0000 mg | ORAL_TABLET | Freq: Once | ORAL | Status: AC
  Administered 2021-03-10: 100 mg via ORAL
  Filled 2021-03-10: qty 4

## 2021-03-10 MED ORDER — ASPIRIN 325 MG PO TABS
325.0000 mg | ORAL_TABLET | Freq: Once | ORAL | Status: AC
  Administered 2021-03-10: 325 mg via ORAL
  Filled 2021-03-10: qty 1

## 2021-03-10 NOTE — Telephone Encounter (Signed)
Put patient in for 130p today with [facility] called patients BP is elevated he is feeling very anxious.  Today BP is 208/84  Sunday the top number was 176-180  Saturday it was 160

## 2021-03-10 NOTE — Telephone Encounter (Signed)
Patient BP went  up to 240/90.  Daughter took him to ED.

## 2021-03-10 NOTE — Telephone Encounter (Signed)
Noted and provider made aware.

## 2021-03-10 NOTE — ED Triage Notes (Signed)
Pt presents to ED with complaints of hypertension. Pt states at home his BP was 240/90. Pt denies dizziness, headache or vomiting.

## 2021-03-10 NOTE — Progress Notes (Signed)
Cardiology Office Note  Date: 03/11/2021   ID: Breylin, Dom 07-08-1930, MRN [id number]  PCP:  Asencion Noble, MD  Cardiologist:  Carlyle Dolly, MD Electrophysiologist:  None   Chief Complaint: 3-week follow-up, ER follow-up hypertension  History of Present Illness: Jonathan Avery is a 87 y.o. male with a history of DM2, Carotid artery occlusion, HLD, HTN, CAD/MI, PVD,CVA.  He was last seen by [name], [place] on 02/29/2020.  He is being seen for possible cardioembolic event.  He had been seen by [name] in November 2019 as a new patient for preop evaluation prior to her surgery for pancreatic mass.  He denied any recent chest pain but did report dyspnea on exertion when walking inclines.  Echo and stress test were ordered.  He had echo showed preserved EF of 55 to 60% with G1 DD and no WMA's.  He did have mild mitral regurgitation and mild aortic regurgitation.  Nuclear stress test showed large inferior wall infarct from apex to base with no current ischemia.   He had been referred to vascular surgery for carotid artery stenosis and underwent right CEA on August 2021 by Dr. Donnetta Hutching.  He reported expressive aphasia thought to be consistent consistent with TIAs and given cardiac history and concern for cardiac etiology follow-up was recommended.  During visit with [name] he reported several episodes of expressive aphasia occurring earlier in the month.  Episodes last for several seconds then resolved.  He had no recurrent episodes of aphasia since that time but had experienced 10-minute episodes of vision loss and was referred for cardiac evaluation he denies any recent chest pain, DOE, palpitations, orthopnea, PND.  Had intermittent lower extremity edema and had been using compression stockings but found them hard to get on.  A 30-day cardiac monitor was ordered To rule out any significant arrhythmias.  He was continuing aspirin and statin therapy for carotid artery stenosis.   Blood pressure was elevated but at clinic but well controlled at home.  He was continuing current medication regimen of amlodipine, chlorthalidone, clonidine, hydralazine, losartan, Lopressor.  He was continuing statin medication for hyperlipidemia.  He was last here with complaints of fatigue and no energy.  EKG showed marked bradycardia with a heart rate of 46.  We discontinued his metoprolol at last visit.  He stated this had made a significant difference.  No current issues with fatigue and no energy.  His heart rate had increased.  He was feeling much better.  Denies any anginal symptoms, palpitations or arrhythmias, orthostatic symptoms.  Denies any PND, orthopnea.  He had some lab work back in June through the [place].Lipid profile: TC 142, TG 271, HDL 31, LDL 57, CBC showed hemoglobin 12.3 and hematocrit 35.7.  Urine creatinine random was 131, microalbumin quantitative was 178, microalbumin creatinine ratio 1358.8.  Renal function creatinine 2.06 and GFR 31.TSH 2.38, vitamin B12 178.  Do not any claudication-like symptoms, CVA or TIA-like symptoms.  His son stated the nephrologist the [facility] is keeping an eye on his renal function.  Recent presentation 03/10/2021 [facility], ED with complaints of high blood pressure.  He had no other symptoms.  Blood pressure was 209/85 on arrival to ED.  His blood pressure had been gradually increasing over the prior few days.  Initial troponin 38, second troponin 26.  He was given amlodipine 5 mg orally along with 325 mg aspirin and hydralazine 100 mg.  His blood pressure improved significantly and he was discharged in  stable condition.  He is here for follow-up of his recent ED visit for elevated blood pressure.  Blood pressure today is 184/76.  He states he took all his medications this morning around 7:00 AM.  It is now 8:45 AM.  His current antihypertensive regimen includes; Amlodipine 5 mg p.o. twice daily, chlorthalidone 25 mg p.o. daily, clonidine 0.2 mg p.o. twice  daily, hydralazine 100 mg by mouth p.o. 3 times daily, olmesartan 40 mg p.o. daily.  He denies any other symptoms.  He states his appetite is not good and he has lost some weight.  He states he has never had a renal artery ultrasound to check for secondary causes such as renal artery stenosis.  We discussed being referred to hypertension clinic.  Past Medical History:  Diagnosis Date   BPH (benign prostatic hypertrophy)    Cancer (HCC)    skin cancer - basal   Carotid artery occlusion    Diabetes (HCC)    Type II   High cholesterol    Hypertension    Myocardial infarction [facility])    Peripheral vascular disease ([place])    Stroke ([place])      documented 12/28/2019- ? TIA  " weeks 3 weeks ago, expresive aphasia lasted 2 -3 minutes."     Past Surgical History:  Procedure Laterality Date   CARDIAC CATHETERIZATION     CHOLECYSTECTOMY  1973   COLONOSCOPY  07/20/2006   Dr. [name]:Status post right hemicolectomy. Residual colonic mucosa appeared normal, normal rectum.    COLONOSCOPY  2006/2007   sprawling villous adenoma at ileocecal valve   COLONOSCOPY  2011   Dr. [name]: normal rectum, pancolonic diverticulosis, 2 diminutive polyps, with path benign polypoid colonic mucosa   COLONOSCOPY N/A 01/02/2015   Procedure: COLONOSCOPY;  Surgeon: Daneil Dolin, MD;  Location: [facility];  Service: Endoscopy;  Laterality: N/A;  1245   CORONARY ANGIOPLASTY     ENDARTERECTOMY Right 12/29/2019   Procedure: ENDARTERECTOMY CAROTID;  Surgeon: [name], MD;  Location: [facility] OR;  Service: Vascular;  Laterality: Right;   ESOPHAGOGASTRODUODENOSCOPY (EGD) WITH PROPOFOL N/A 01/13/2018   Procedure: ESOPHAGOGASTRODUODENOSCOPY (EGD) WITH PROPOFOL;  Surgeon: [name], MD;  Location: [facility] ENDOSCOPY;  Service: Endoscopy;  Laterality: N/A;   EUS N/A 01/13/2018   Procedure: UPPER ENDOSCOPIC ULTRASOUND (EUS) RADIAL;  Surgeon: [name], MD;  Location: [facility] ENDOSCOPY;  Service: Endoscopy;  Laterality: N/A;   FINE  NEEDLE ASPIRATION N/A 01/13/2018   Procedure: FINE NEEDLE ASPIRATION (FNA) LINEAR;  Surgeon: [name], MD;  Location: [facility] ENDOSCOPY;  Service: Endoscopy;  Laterality: N/A;   open hemicolectomy  2007   due to villous adenoma   PATCH ANGIOPLASTY Right 12/29/2019   Procedure: RIGHT CAROTID PATCH ANGIOPLASTY USING HEMASHIELD PLATINUM FINESSE;  Surgeon: [name], MD;  Location: [facility] OR;  Service: Vascular;  Laterality: Right;    Current Outpatient Medications  Medication Sig Dispense Refill   amLODipine (NORVASC) 10 MG tablet Take 1 tablet (10 mg total) by mouth daily. 90 tablet 1   aspirin 81 MG tablet Take 81 mg by mouth at bedtime.      chlorthalidone (HYGROTON) 25 MG tablet Take 1 tablet (25 mg total) by mouth daily. 90 tablet 3   cloNIDine (CATAPRES) 0.2 MG tablet Take 1 tablet (0.2 mg total) by mouth 2 (two) times daily. 60 tablet 11   diphenhydramine-acetaminophen (TYLENOL PM) 25-500 MG TABS tablet Take 1 tablet by mouth at bedtime as needed (sleep).  hydrALAZINE (APRESOLINE) 100 MG tablet Take 1 tablet (100 mg total) by mouth 3 (three) times daily. Dose change. 180 tablet 5   insulin aspart (NOVOLOG) 100 UNIT/ML FlexPen Inject 15 Units into the skin 2 (two) times daily.     LANTUS SOLOSTAR 100 UNIT/ML Solostar Pen Inject 45 Units into the skin 2 (two) times daily.     magnesium gluconate (MAGONATE) 500 MG tablet Take 500 mg by mouth daily.     metFORMIN (GLUCOPHAGE) 500 MG tablet Take 500 mg by mouth in the morning and at bedtime.     olmesartan (BENICAR) 40 MG tablet Take 40 mg by mouth daily.     rosuvastatin (CRESTOR) 10 MG tablet Take 40 mg by mouth daily.     tamsulosin (FLOMAX) 0.4 MG CAPS capsule Take 0.4 mg by mouth 2 (two) times daily.     No current facility-administered medications for this visit.   Allergies:  Patient has no known allergies.   Social History: The patient  reports that he has quit smoking. His smoking use included cigars. He has never used  smokeless tobacco. He reports that he does not drink alcohol and does not use drugs.   Family History: The patient's family history includes Diabetes type II in his brother and brother; Ulcers in his father.   ROS:  Please see the history of present illness. Otherwise, complete review of systems is positive for none.  All other systems are reviewed and negative.   Physical Exam: VS:  BP (!) 184/76   Pulse 84   Ht 5' 10"  (1.778 m)   Wt 203 lb (92.1 kg)   SpO2 98%   BMI 29.13 kg/m , BMI Body mass index is 29.13 kg/m.  Wt Readings from Last 3 Encounters:  03/11/21 203 lb (92.1 kg)  03/10/21 205 lb 12.8 oz (93.4 kg)  02/14/21 211 lb 6.4 oz (95.9 kg)    General: Patient appears comfortable at rest. Neck: Supple, no elevated JVP or carotid bruits, no thyromegaly. Lungs: Clear to auscultation, nonlabored breathing at rest. Cardiac: Bradycardic rate and rhythm, no S3 or significant systolic murmur, no pericardial rub. Extremities: No pitting edema, distal pulses 2+. Skin: Warm and dry. Musculoskeletal: No kyphosis. Neuropsychiatric: Alert and oriented x3, affect grossly appropriate.  ECG: 01/14/2021 EKG Marked sinus bradycardia with a rate of 46, ST and T wave abnormality, consider inferior lateral ischemia.  Recent Labwork: 05/30/2020: ALT 13; AST 18 03/10/2021: BUN 29; Creatinine, Ser 1.48; Hemoglobin 14.6; Platelets 186; Potassium 3.9; Sodium 133  No results found for: CHOL, TRIG, HDL, CHOLHDL, VLDL, LDLCALC, LDLDIRECT  Other Studies Reviewed Today:  Carotid artery duplex 11/11/2020 Summary:  Right Carotid: Velocities in the right ICA are consistent with a 1-39%  stenosis.                 Patent CEA.   Left Carotid: Velocities in the left ICA are consistent with a 40-59%  stenosis.   Vertebrals:  Bilateral vertebral arteries demonstrate antegrade flow.  Subclavians: Normal flow hemodynamics were seen in bilateral subclavian               arteries    NST: 03/2018 Blood  pressure demonstrated a normal response to exercise. There was no ST segment deviation noted during stress. Findings consistent with prior myocardial infarction. This is an intermediate risk study. The left ventricular ejection fraction is moderately decreased (30-44%).   Large inferior wall infarct from apex to base with no ischemia EF 42%   Echocardiogram:  03/2018 Study Conclusions   - Left ventricle: The cavity size was normal. Wall thickness was    normal. Systolic function was normal. The estimated ejection    fraction was in the range of 55% to 60%. Wall motion was normal;    there were no regional wall motion abnormalities. Doppler    parameters are consistent with abnormal left ventricular    relaxation (grade 1 diastolic dysfunction).  - Aortic valve: Mildly calcified annulus. Trileaflet. There was    mild regurgitation.  - Mitral valve: Mildly calcified annulus. There was mild    regurgitation.  - Left atrium: The atrium was mildly dilated.  - Tricuspid valve: There was trivial regurgitation. Peak RV-RA    gradient (S): 29 mm Hg.  - Pulmonary arteries: Systolic pressure could not be accurately    estimated.  - Pericardium, extracardiac: There was no pericardial effusion.    Assessment and Plan:  1. Essential hypertension   2. Other fatigue   3. Bilateral carotid artery stenosis      1. Essential hypertension Has been elevated since stopping metoprolol.  We had stopped metoprolol prior due to fatigue and slow heart rate.  Recent visit to emergency room for elevated blood pressure.  He received hydralazine 100 mg and amlodipine 5 mg.  Blood pressure significantly improved and he was discharged.  Advised him instead of taking Norvasc 5 mg p.o. twice daily to start taking 10 mg in a.m. only.  Continue chlorthalidone 25 mg daily, clonidine 0.2 mg p.o. twice daily, hydralazine 100 mg p.o. 3 times daily, olmesartan 40 mg p.o. daily.  Continue to monitor your blood pressures.   In the fact he is on multiple antihypertensive agents and continuing to have problems with blood pressure control will refer to hypertension clinic for further evaluation and management.  2. Other fatigue At last visit he complained of significant fatigue and slow heart rates.  We stopped metoprolol and patient states he is feeling much better since starting the metoprolol.  He denies any current fatigue.  23.  States his energy is much better.  3.  Bilateral carotid artery stenosis Recent follow-up carotid artery duplex ordered by Dr. Donnetta Hutching showed L ICA stenosis 40 to 59%, R ICA stenosis 1 to 39% with patent CEA  Medication Adjustments/Labs and Tests Ordered: Current medicines are reviewed at length with the patient today.  Concerns regarding medicines are outlined above.   Disposition: Follow-up with [name] or [facility] 6 months  Signed, Levell July, NP 03/11/2021 9:00 AM    [facility] [place], [place], [place] [address] Phone: [phone]; Fax: [phone]

## 2021-03-10 NOTE — Telephone Encounter (Signed)
Pt c/o BP issue: STAT if pt c/o blurred vision, one-sided weakness or slurred speech  1. What are your last 5 BP readings? 210/82 and 220/84  2. Are you having any other symptoms (ex. Dizziness, headache, blurred vision, passed out)? No symptoms  3. What is your BP issue? Elevated readings  Patient is scheduled to see Jonni Sanger this afternoon.

## 2021-03-10 NOTE — ED Provider Notes (Signed)
Emergency Medicine Provider Triage Evaluation Note  Jonathan Avery , a 87 y.o. male  was evaluated in triage.  Pt complains of high blood pressure.  Patient told to go to the ED due to blood pressure being so high.  Takes for medicine for blood pressure, has not has any doses.  Last week his amlodipine was doubled, denies any other changes.  Review of Systems  Positive: High blood pressure Negative: He denies any symptoms, no headache, nausea, vomiting, vision changes, chest pain, shortness of breath.  Physical Exam  BP (!) 209/85 (BP Location: Right Arm)   Pulse 92   Temp 98 F (36.7 C) (Oral)   Resp 16   Ht 5' 10"  (1.778 m)   Wt 93.4 kg   SpO2 93%   BMI 29.53 kg/m  Gen:   Awake, no distress   Resp:  Normal effort  MSK:   Moves extremities without difficulty  Other:    Medical Decision Making  Medically screening exam initiated at 11:00 AM.  Appropriate orders placed.  Waynetta Sandy was informed that the remainder of the evaluation will be completed by another provider, this initial triage assessment does not replace that evaluation, and the importance of remaining in the ED until their evaluation is complete.  Hypertensive emergency labs.   Sherrill Raring, PA-C 03/10/21 [place], DO 03/10/21 1406

## 2021-03-10 NOTE — ED Provider Notes (Signed)
[facility] EMERGENCY DEPARTMENT Provider Note   CSN: [id number] Arrival date & time: 03/10/21  1017     History Chief Complaint  Patient presents with   Hypertension    Jonathan Avery is a 87 y.o. male.  This is a 87 y.o. male with significant medical history as below, including HTN, HLD who presents to the ED with complaint of elevated blood pressure reading. BP gradually increasing over the past few days. Good compliance with home medications.    Duration:  3-4 days Onset:  gradual Timing:  constant Exacerbating/Alleviating Factors:  not worsened with exertion or improved with rest  Associated Symptoms:  non specified Pertinent Negatives:  no chest pain, dyspnea, nausea, HA, lightheadedness, no vision changes, no falls, no change to UOP, no abdominal pain, no fevers or chills.    The history is provided by the patient and a relative. No language interpreter was used.  Hypertension This is a chronic problem. The current episode started more than 2 days ago. The problem has been gradually worsening. Pertinent negatives include no chest pain, no abdominal pain, no headaches and no shortness of breath. Nothing aggravates the symptoms. Nothing relieves the symptoms.      Past Medical History:  Diagnosis Date   BPH (benign prostatic hypertrophy)    Cancer (HCC)    skin cancer - basal   Carotid artery occlusion    Diabetes (HCC)    Type II   High cholesterol    Hypertension    Myocardial infarction [facility])    Peripheral vascular disease ([place])    Stroke ([place])      documented 12/28/2019- ? TIA  " weeks 3 weeks ago, expresive aphasia lasted 2 -3 minutes."     Patient Active Problem List   Diagnosis Date Noted   Hypertensive emergency 05/30/2020   Hypertensive encephalopathy 05/30/2020   Asymptomatic carotid artery stenosis without infarction, right 12/29/2019   Pancreatic mass    Diabetes ([place]) 11/29/2017   Essential hypertension 11/29/2017   Dyslipidemia 11/29/2017    Myocardial infarction ([place]) 11/29/2017   BPH (benign prostatic hyperplasia) 11/29/2017   SBO (small bowel obstruction) ([place]) 11/29/2017   CKD (chronic kidney disease), stage III ([place]) 11/29/2017   Hx of colonic polyps    History of colonic polyps 10/10/2009    Past Surgical History:  Procedure Laterality Date   [place]   COLONOSCOPY  07/20/2006   Dr. [name]:Status post right hemicolectomy. Residual colonic mucosa appeared normal, normal rectum.    COLONOSCOPY  2006/2007   sprawling villous adenoma at ileocecal valve   COLONOSCOPY  2011   Dr. [name]: normal rectum, pancolonic diverticulosis, 2 diminutive polyps, with path benign polypoid colonic mucosa   COLONOSCOPY N/A 01/02/2015   Procedure: COLONOSCOPY;  Surgeon: Daneil Dolin, MD;  Location: [facility] ENDO SUITE;  Service: Endoscopy;  Laterality: N/A;  1245   CORONARY ANGIOPLASTY     ENDARTERECTOMY Right 12/29/2019   Procedure: ENDARTERECTOMY CAROTID;  Surgeon: [name], MD;  Location: [facility] OR;  Service: Vascular;  Laterality: Right;   ESOPHAGOGASTRODUODENOSCOPY (EGD) WITH PROPOFOL N/A 01/13/2018   Procedure: ESOPHAGOGASTRODUODENOSCOPY (EGD) WITH PROPOFOL;  Surgeon: [name], MD;  Location: [facility] ENDOSCOPY;  Service: Endoscopy;  Laterality: N/A;   EUS N/A 01/13/2018   Procedure: UPPER ENDOSCOPIC ULTRASOUND (EUS) RADIAL;  Surgeon: [name], MD;  Location: [facility] ENDOSCOPY;  Service: Endoscopy;  Laterality: N/A;   FINE NEEDLE ASPIRATION N/A 01/13/2018   Procedure: FINE NEEDLE  ASPIRATION (FNA) LINEAR;  Surgeon: [name], MD;  Location: [facility] ENDOSCOPY;  Service: Endoscopy;  Laterality: N/A;   open hemicolectomy  2007   due to villous adenoma   PATCH ANGIOPLASTY Right 12/29/2019   Procedure: RIGHT CAROTID PATCH ANGIOPLASTY USING HEMASHIELD PLATINUM FINESSE;  Surgeon: [name], MD;  Location: [facility] OR;  Service: Vascular;  Laterality: Right;       Family History  Problem Relation Age of  Onset   Ulcers Father    Diabetes type II Brother    Diabetes type II Brother    Colon cancer Neg Hx     Social History   Tobacco Use   Smoking status: Former    Types: Cigars   Smokeless tobacco: Never  Vaping Use   Vaping Use: Never used  Substance Use Topics   Alcohol use: No    Alcohol/week: 0.0 standard drinks   Drug use: No    Home Medications Prior to Admission medications   Medication Sig Start Date End Date Taking? Authorizing Provider  amLODipine (NORVASC) 5 MG tablet Take 1 tablet (5 mg total) by mouth in the morning and at bedtime. 02/14/21 05/15/21 Yes [name]., NP  aspirin 81 MG tablet Take 81 mg by mouth at bedtime.    Yes [provider]  chlorthalidone (HYGROTON) 25 MG tablet Take 1 tablet (25 mg total) by mouth daily. 03/28/18 01/14/22 Yes Branch, Alphonse Guild, MD  cloNIDine (CATAPRES) 0.2 MG tablet Take 1 tablet (0.2 mg total) by mouth 2 (two) times daily. 06/17/20  Yes Strader, [place] M, PA-C  diphenhydramine-acetaminophen (TYLENOL PM) 25-500 MG TABS tablet Take 1 tablet by mouth at bedtime as needed (sleep).   Yes [provider]  hydrALAZINE (APRESOLINE) 100 MG tablet Take 1 tablet (100 mg total) by mouth 3 (three) times daily. Dose change. 05/28/20  Yes Strader, [place] M, PA-C  insulin aspart (NOVOLOG) 100 UNIT/ML FlexPen Inject 15 Units into the skin 2 (two) times daily.   Yes [provider]  LANTUS SOLOSTAR 100 UNIT/ML Solostar Pen Inject 45 Units into the skin 2 (two) times daily. 11/27/14  Yes [provider]  magnesium gluconate (MAGONATE) 500 MG tablet Take 500 mg by mouth daily.   Yes [provider]  metFORMIN (GLUCOPHAGE) 500 MG tablet Take 500 mg by mouth in the morning and at bedtime.   Yes [provider]  olmesartan (BENICAR) 40 MG tablet Take 40 mg by mouth daily. 09/09/20  Yes [provider]  rosuvastatin (CRESTOR) 10 MG tablet Take 40 mg by mouth daily.   Yes [provider]  tamsulosin (FLOMAX) 0.4 MG CAPS capsule Take 0.4 mg by mouth 2 (two) times daily. 11/05/14  Yes [provider]    Allergies    Patient has no known allergies.  Review of Systems   Review of Systems  Constitutional:  Negative for chills and fever.  HENT:  Negative for facial swelling and trouble swallowing.   Eyes:  Negative for photophobia and visual disturbance.  Respiratory:  Negative for cough and shortness of breath.   Cardiovascular:  Negative for chest pain and palpitations.  Gastrointestinal:  Negative for abdominal pain, nausea and vomiting.  Endocrine: Negative for polydipsia and polyuria.  Genitourinary:  Negative for difficulty urinating and hematuria.  Musculoskeletal:  Negative for gait problem and joint swelling.  Skin:  Negative for pallor and rash.  Neurological:  Negative for syncope and headaches.  Psychiatric/Behavioral:  Negative for agitation and confusion.  Physical Exam Updated Vital Signs BP (!) 187/77   Pulse 85   Temp 98 F (36.7 C) (Oral)   Resp 16   Ht 5' 10"  (1.778 m)   Wt 93.4 kg   SpO2 96%   BMI 29.53 kg/m   Physical Exam Vitals and nursing note reviewed.  Constitutional:      General: He is not in acute distress.    Appearance: He is well-developed.  HENT:     Head: Normocephalic and atraumatic.     Right Ear: External ear normal.     Left Ear: External ear normal.     Mouth/Throat:     Mouth: Mucous membranes are moist.  Eyes:     General: No scleral icterus. Cardiovascular:     Rate and Rhythm: Normal rate and regular rhythm.     Pulses: Normal pulses.     Heart sounds: Normal heart sounds.  Pulmonary:     Effort: Pulmonary effort is normal. No respiratory distress.     Breath sounds: Normal breath sounds.  Abdominal:     General: Abdomen is flat.     Palpations: Abdomen is soft.     Tenderness: There is no abdominal tenderness.  Musculoskeletal:        General: Normal range of motion.      Cervical back: Normal range of motion.     Right lower leg: No edema.     Left lower leg: No edema.  Skin:    General: Skin is warm and dry.     Capillary Refill: Capillary refill takes less than 2 seconds.  Neurological:     Mental Status: He is alert and oriented to person, place, and time.     GCS: GCS eye subscore is 4. GCS verbal subscore is 5. GCS motor subscore is 6.  Psychiatric:        Mood and Affect: Mood normal.        Behavior: Behavior normal.    ED Results / Procedures / Treatments   Labs (all labs ordered are listed, but only abnormal results are displayed) Labs Reviewed  BASIC METABOLIC PANEL - Abnormal; Notable for the following components:      Result Value   Sodium 133 (*)    CO2 18 (*)    Glucose, Bld 187 (*)    BUN 29 (*)    Creatinine, Ser 1.48 (*)    GFR, Estimated 45 (*)    All other components within normal limits  CBC - Abnormal; Notable for the following components:   WBC 11.1 (*)    All other components within normal limits  TROPONIN I (HIGH SENSITIVITY) - Abnormal; Notable for the following components:   Troponin I (High Sensitivity) 30 (*)    All other components within normal limits  TROPONIN I (HIGH SENSITIVITY) - Abnormal; Notable for the following components:   Troponin I (High Sensitivity) 26 (*)    All other components within normal limits    EKG EKG Interpretation  Date/Time:  Monday March 10 2021 10:54:04 EDT Ventricular Rate:  92 PR Interval:  170 QRS Duration: 106 QT Interval:  348 QTC Calculation: 430 R Axis:   48 Text Interpretation: Normal sinus rhythm Inferior infarct , age undetermined Anterior infarct , age undetermined Abnormal ECG Confirmed by Wynona Dove ([id number]) on 03/10/2021 10:57:58 AM  Radiology DG Chest 2 View  Result Date: 03/10/2021 CLINICAL DATA:  Hypertension EXAM: CHEST - 2 VIEW COMPARISON:  05/30/2020 FINDINGS: The heart size and mediastinal contours  are within normal limits. Both lungs are clear. The  visualized skeletal structures are unremarkable. IMPRESSION: No active cardiopulmonary disease. Electronically Signed   By: Davina Poke D.O.   On: 03/10/2021 11:52    Procedures Procedures   Medications Ordered in ED Medications  amLODipine (NORVASC) tablet 5 mg (5 mg Oral Given 03/10/21 1348)  aspirin tablet 325 mg (325 mg Oral Given 03/10/21 1436)  hydrALAZINE (APRESOLINE) tablet 100 mg (100 mg Oral Given 03/10/21 1436)    ED Course  I have reviewed the triage vital signs and the nursing notes.  Pertinent labs & imaging results that were available during my care of the patient were reviewed by me and considered in my medical decision making (see chart for details).    MDM Rules/Calculators/A&P                         This patient complains of elevated bp reading; this involves an extensive number of treatment options and is a complaint that carries with it a high risk of complications and morbidity. Vital signs reviewed, elevated BP is noted. Vital signs otherwise stable. Serious etiologies considered.     I ordered, reviewed and interpreted labs  Cr similar to baseline.  EKG stable, no STEMI. Pt with troponin similar to his baseline, downtrend to delta trop. ACS unlikely.  I ordered medication norvasc, hydralazine (home bp meds)  I ordered imaging studies which included CXR and I independently    visualized and interpreted imaging which showed no acute process  Additional history obtained from family  Previous records obtained and reviewed   BP has improved following dosing of home medications. He has no acute complaints other than elevated BP. No CP or dyspnea, no light headedness or other systemic complaints concerning for hypertensive emergency/urgency. Cr and trop are similar to his baseline. Recommend pt follow up w/ cardiology regarding Bp management. Advised BP records over next couple days and avoid stimulants, take bp Meds as prescribed. -Asymptomatic  hypertension   The patient improved significantly and was discharged in stable condition. Detailed discussions were had with the patient regarding current findings, and need for close f/u with PCP or on call doctor. The patient has been instructed to return immediately if the symptoms worsen in any way for re-evaluation. Patient verbalized understanding and is in agreement with current care plan. All questions answered prior to discharge.     Final Clinical Impression(s) / ED Diagnoses Final diagnoses:  Hypertension, unspecified type    Rx / DC Orders ED Discharge Orders     None        Jeanell Sparrow, DO 03/10/21 1829

## 2021-03-11 ENCOUNTER — Encounter: Payer: Self-pay | Admitting: Family Medicine

## 2021-03-11 ENCOUNTER — Ambulatory Visit: Payer: Medicare HMO | Admitting: Family Medicine

## 2021-03-11 VITALS — BP 184/76 | HR 84 | Ht 70.0 in | Wt 203.0 lb

## 2021-03-11 DIAGNOSIS — I10 Essential (primary) hypertension: Secondary | ICD-10-CM

## 2021-03-11 DIAGNOSIS — R001 Bradycardia, unspecified: Secondary | ICD-10-CM

## 2021-03-11 DIAGNOSIS — R5383 Other fatigue: Secondary | ICD-10-CM | POA: Diagnosis not present

## 2021-03-11 DIAGNOSIS — I6523 Occlusion and stenosis of bilateral carotid arteries: Secondary | ICD-10-CM

## 2021-03-11 MED ORDER — AMLODIPINE BESYLATE 10 MG PO TABS
10.0000 mg | ORAL_TABLET | Freq: Every day | ORAL | 1 refills | Status: DC
Start: 2021-03-11 — End: 2021-09-22

## 2021-03-11 NOTE — Patient Instructions (Signed)
Medication Instructions:  Your physician has recommended you make the following change in your medication:  Change amlodipine to 10 mg daily in the morning Continue other medications the same  Labwork: none  Testing/Procedures: none  Follow-Up: Your physician recommends that you schedule a follow-up appointment in: as planned  Any Other Special Instructions Will Be Listed Below (If Applicable). You have been referred to the Hypertension Clinic  If you need a refill on your cardiac medications before your next appointment, please call your pharmacy.

## 2021-03-12 ENCOUNTER — Telehealth: Payer: Self-pay | Admitting: Family Medicine

## 2021-03-12 DIAGNOSIS — Z79899 Other long term (current) drug therapy: Secondary | ICD-10-CM

## 2021-03-12 DIAGNOSIS — I10 Essential (primary) hypertension: Secondary | ICD-10-CM

## 2021-03-12 NOTE — Telephone Encounter (Signed)
Can he get a bmet/mg/ renin/aldosterone level labs. Please order renal artery [place] for resistant HTN. May start aldactone once labs are drawn, if he is able to get labs this week that would be great.    Zandra Abts MD

## 2021-03-12 NOTE — Telephone Encounter (Signed)
Patient informed and verbalized understanding of plan. 

## 2021-03-12 NOTE — Addendum Note (Signed)
Addended by: Merlene Laughter on: 03/12/2021 03:01 PM   Modules accepted: Orders

## 2021-03-12 NOTE — Telephone Encounter (Signed)
Reports checking home blood pressures manually, same time, 1-2 hours after BP medications, 10-15 minutes after sitting. BP this morning 200/84. Reports having upcoming appointment with PCP this coming Tuesday. Advised that he should continue monitoring BP, and allow more time for amlodipine adjustment to take affect. Advised that while awaiting HTN Clinic appointment, he can follow with PCP closer. Verbalized understanding.

## 2021-03-12 NOTE — Telephone Encounter (Signed)
Pt called stating that the hypertension clinic is unable to see him till January. Wanted to know what he should do before then since his BP is still so high even with all the medication he's on.  Please call [phone]

## 2021-03-17 DIAGNOSIS — I10 Essential (primary) hypertension: Secondary | ICD-10-CM | POA: Diagnosis not present

## 2021-03-17 DIAGNOSIS — Z79899 Other long term (current) drug therapy: Secondary | ICD-10-CM | POA: Diagnosis not present

## 2021-03-18 DIAGNOSIS — N1831 Chronic kidney disease, stage 3a: Secondary | ICD-10-CM | POA: Diagnosis not present

## 2021-03-18 DIAGNOSIS — I10 Essential (primary) hypertension: Secondary | ICD-10-CM | POA: Diagnosis not present

## 2021-03-18 DIAGNOSIS — R0782 Intercostal pain: Secondary | ICD-10-CM | POA: Diagnosis not present

## 2021-03-18 DIAGNOSIS — R55 Syncope and collapse: Secondary | ICD-10-CM | POA: Diagnosis not present

## 2021-03-18 DIAGNOSIS — Z23 Encounter for immunization: Secondary | ICD-10-CM | POA: Diagnosis not present

## 2021-03-19 ENCOUNTER — Telehealth: Payer: Self-pay | Admitting: Cardiology

## 2021-03-19 ENCOUNTER — Telehealth: Payer: Self-pay | Admitting: *Deleted

## 2021-03-19 ENCOUNTER — Other Ambulatory Visit: Payer: Self-pay | Admitting: Internal Medicine

## 2021-03-19 ENCOUNTER — Other Ambulatory Visit: Payer: Self-pay

## 2021-03-19 ENCOUNTER — Encounter (HOSPITAL_COMMUNITY): Payer: Self-pay

## 2021-03-19 ENCOUNTER — Ambulatory Visit (HOSPITAL_COMMUNITY)
Admission: RE | Admit: 2021-03-19 | Discharge: 2021-03-19 | Disposition: A | Discharge status: Home / Self Care | Payer: Medicare HMO | Source: Ambulatory Visit | Attending: Cardiology | Admitting: Cardiology

## 2021-03-19 ENCOUNTER — Ambulatory Visit (INDEPENDENT_AMBULATORY_CARE_PROVIDER_SITE_OTHER): Payer: Medicare HMO

## 2021-03-19 DIAGNOSIS — I10 Essential (primary) hypertension: Secondary | ICD-10-CM

## 2021-03-19 DIAGNOSIS — R55 Syncope and collapse: Secondary | ICD-10-CM

## 2021-03-19 NOTE — Addendum Note (Signed)
Addended by: Levonne Hubert on: 03/19/2021 02:36 PM   Modules accepted: Orders

## 2021-03-19 NOTE — Telephone Encounter (Signed)
Wrong order placed Will order renal doppler

## 2021-03-19 NOTE — Telephone Encounter (Signed)
  [facility] ultrasound tech calling due to pt being there for renal ultrasound... per [name] notes pt should also be getting an ultrasound of renal artery... advised that this is something Vascular wpuld have to do, they are not able to complete this...Marland Kitchen please advise

## 2021-03-19 NOTE — Telephone Encounter (Signed)
Received a fax from Dr. Ria Comment office requesting a 7 day Zio monitor for syncope. Order placed.

## 2021-03-21 LAB — BASIC METABOLIC PANEL
BUN/Creatinine Ratio: 29 — ABNORMAL HIGH (ref 10–24)
BUN: 57 mg/dL — ABNORMAL HIGH (ref 8–27)
CO2: 18 mmol/L — ABNORMAL LOW (ref 20–29)
Calcium: 9.1 mg/dL (ref 8.6–10.2)
Chloride: 104 mmol/L (ref 96–106)
Creatinine, Ser: 1.97 mg/dL — ABNORMAL HIGH (ref 0.76–1.27)
Glucose: 185 mg/dL — ABNORMAL HIGH (ref 70–99)
Potassium: 4.8 mmol/L (ref 3.5–5.2)
Sodium: 138 mmol/L (ref 134–144)
eGFR: 32 mL/min/{1.73_m2} — ABNORMAL LOW (ref >59)

## 2021-03-21 LAB — MAGNESIUM: Magnesium: 2 mg/dL (ref 1.6–2.3)

## 2021-03-21 LAB — ALDOSTERONE + RENIN ACTIVITY W/ RATIO
ALDOS/RENIN RATIO: 0.3 (ref 0.0–30.0)
ALDOSTERONE: 6.2 ng/dL (ref 0.0–30.0)
Renin: 22.192 ng/mL/hr — ABNORMAL HIGH (ref 0.167–5.380)

## 2021-03-23 ENCOUNTER — Encounter (HOSPITAL_COMMUNITY): Payer: Self-pay

## 2021-03-23 ENCOUNTER — Other Ambulatory Visit: Payer: Self-pay

## 2021-03-23 ENCOUNTER — Emergency Department (HOSPITAL_COMMUNITY)
Admission: EM | Admit: 2021-03-23 | Discharge: 2021-03-23 | Disposition: A | Discharge status: Home / Self Care | Payer: Medicare HMO | Attending: Emergency Medicine | Admitting: Emergency Medicine

## 2021-03-23 ENCOUNTER — Emergency Department (HOSPITAL_COMMUNITY): Payer: Medicare HMO

## 2021-03-23 DIAGNOSIS — Z85828 Personal history of other malignant neoplasm of skin: Secondary | ICD-10-CM | POA: Diagnosis not present

## 2021-03-23 DIAGNOSIS — I129 Hypertensive chronic kidney disease with stage 1 through stage 4 chronic kidney disease, or unspecified chronic kidney disease: Secondary | ICD-10-CM | POA: Diagnosis not present

## 2021-03-23 DIAGNOSIS — W19XXXA Unspecified fall, initial encounter: Secondary | ICD-10-CM | POA: Diagnosis not present

## 2021-03-23 DIAGNOSIS — Z794 Long term (current) use of insulin: Secondary | ICD-10-CM | POA: Insufficient documentation

## 2021-03-23 DIAGNOSIS — J341 Cyst and mucocele of nose and nasal sinus: Secondary | ICD-10-CM | POA: Diagnosis not present

## 2021-03-23 DIAGNOSIS — Y92009 Unspecified place in unspecified non-institutional (private) residence as the place of occurrence of the external cause: Secondary | ICD-10-CM | POA: Insufficient documentation

## 2021-03-23 DIAGNOSIS — R55 Syncope and collapse: Secondary | ICD-10-CM | POA: Diagnosis not present

## 2021-03-23 DIAGNOSIS — Z87891 Personal history of nicotine dependence: Secondary | ICD-10-CM | POA: Diagnosis not present

## 2021-03-23 DIAGNOSIS — I10 Essential (primary) hypertension: Secondary | ICD-10-CM | POA: Diagnosis not present

## 2021-03-23 DIAGNOSIS — Z79899 Other long term (current) drug therapy: Secondary | ICD-10-CM | POA: Diagnosis not present

## 2021-03-23 DIAGNOSIS — W1839XA Other fall on same level, initial encounter: Secondary | ICD-10-CM | POA: Diagnosis not present

## 2021-03-23 DIAGNOSIS — N183 Chronic kidney disease, stage 3 unspecified: Secondary | ICD-10-CM | POA: Diagnosis not present

## 2021-03-23 DIAGNOSIS — E1122 Type 2 diabetes mellitus with diabetic chronic kidney disease: Secondary | ICD-10-CM | POA: Diagnosis not present

## 2021-03-23 DIAGNOSIS — Z7982 Long term (current) use of aspirin: Secondary | ICD-10-CM | POA: Diagnosis not present

## 2021-03-23 DIAGNOSIS — S0181XA Laceration without foreign body of other part of head, initial encounter: Secondary | ICD-10-CM | POA: Diagnosis not present

## 2021-03-23 DIAGNOSIS — S0990XA Unspecified injury of head, initial encounter: Secondary | ICD-10-CM | POA: Diagnosis not present

## 2021-03-23 DIAGNOSIS — Z7984 Long term (current) use of oral hypoglycemic drugs: Secondary | ICD-10-CM | POA: Insufficient documentation

## 2021-03-23 DIAGNOSIS — I6381 Other cerebral infarction due to occlusion or stenosis of small artery: Secondary | ICD-10-CM | POA: Diagnosis not present

## 2021-03-23 LAB — COMPREHENSIVE METABOLIC PANEL
ALT: 18 U/L (ref 0–44)
AST: 23 U/L (ref 15–41)
Albumin: 3.5 g/dL (ref 3.5–5.0)
Alkaline Phosphatase: 64 U/L (ref 38–126)
Anion gap: 11 (ref 5–15)
BUN: 46 mg/dL — ABNORMAL HIGH (ref 8–23)
CO2: 22 mmol/L (ref 22–32)
Calcium: 9.1 mg/dL (ref 8.9–10.3)
Chloride: 101 mmol/L (ref 98–111)
Creatinine, Ser: 2.02 mg/dL — ABNORMAL HIGH (ref 0.61–1.24)
GFR, Estimated: 31 mL/min — ABNORMAL LOW (ref >60)
Glucose, Bld: 194 mg/dL — ABNORMAL HIGH (ref 70–99)
Potassium: 4.5 mmol/L (ref 3.5–5.1)
Sodium: 134 mmol/L — ABNORMAL LOW (ref 135–145)
Total Bilirubin: 0.2 mg/dL — ABNORMAL LOW (ref 0.3–1.2)
Total Protein: 6.7 g/dL (ref 6.5–8.1)

## 2021-03-23 LAB — CBC WITH DIFFERENTIAL/PLATELET
Abs Immature Granulocytes: 0.02 10*3/uL (ref 0.00–0.07)
Basophils Absolute: 0 10*3/uL (ref 0.0–0.1)
Basophils Relative: 0 %
Eosinophils Absolute: 0.3 10*3/uL (ref 0.0–0.5)
Eosinophils Relative: 2 %
HCT: 40.5 % (ref 39.0–52.0)
Hemoglobin: 13.6 g/dL (ref 13.0–17.0)
Immature Granulocytes: 0 %
Lymphocytes Relative: 15 %
Lymphs Abs: 1.6 10*3/uL (ref 0.7–4.0)
MCH: 31.1 pg (ref 26.0–34.0)
MCHC: 33.6 g/dL (ref 30.0–36.0)
MCV: 92.5 fL (ref 80.0–100.0)
Monocytes Absolute: 0.8 10*3/uL (ref 0.1–1.0)
Monocytes Relative: 7 %
Neutro Abs: 8.1 10*3/uL — ABNORMAL HIGH (ref 1.7–7.7)
Neutrophils Relative %: 76 %
Platelets: 203 10*3/uL (ref 150–400)
RBC: 4.38 MIL/uL (ref 4.22–5.81)
RDW: 12.5 % (ref 11.5–15.5)
WBC: 10.8 10*3/uL — ABNORMAL HIGH (ref 4.0–10.5)
nRBC: 0 % (ref 0.0–0.2)

## 2021-03-23 LAB — CBG MONITORING, ED: Glucose-Capillary: 192 mg/dL — ABNORMAL HIGH (ref 70–99)

## 2021-03-23 MED ORDER — SODIUM CHLORIDE 0.9 % IV BOLUS
1000.0000 mL | Freq: Once | INTRAVENOUS | Status: AC
  Administered 2021-03-23: 1000 mL via INTRAVENOUS

## 2021-03-23 NOTE — ED Notes (Signed)
Signature pad not working at time of d/c, pt verbalized understanding of written and verbal instructions.

## 2021-03-23 NOTE — Discharge Instructions (Signed)
Stop taking your chlorthalidone.  Clean laceration twice a day with soap and water.  Get staples removed in 5 days.  Follow-up with your doctor this week to see how your blood pressure is doing.  Also check on your tetanus  shot status with your family doctor

## 2021-03-23 NOTE — ED Triage Notes (Addendum)
Pt arrived via EMS. Pt complains of syncopal episode while using bathroom. EMS stated that pt is seeing specialist in January for possible renal artery problems. Pt stated he has "blacked out" twice in the last week. EMS stated that pt's BP has been over 022 systolic.

## 2021-03-23 NOTE — ED Provider Notes (Signed)
[facility] Provider Note   CSN: [id number] Arrival date & time: 03/23/21  1806     History No chief complaint on file.   Jonathan Avery is a 87 y.o. male.  Patient fell at home from being dizzy.  He is on 5 blood pressure medicine   The history is provided by the patient and medical records. No language interpreter was used.  Fall This is a new problem. The current episode started 1 to 2 hours ago. The problem occurs rarely. The problem has been resolved. Pertinent negatives include no chest pain, no abdominal pain and no headaches. Nothing aggravates the symptoms. Nothing relieves the symptoms. He has tried nothing for the symptoms.      Past Medical History:  Diagnosis Date   BPH (benign prostatic hypertrophy)    Cancer (HCC)    skin cancer - basal   Carotid artery occlusion    Diabetes (HCC)    Type II   High cholesterol    Hypertension    Myocardial infarction [facility])    Peripheral vascular disease ([place])    Stroke ([place])      documented 12/28/2019- ? TIA  " weeks 3 weeks ago, expresive aphasia lasted 2 -3 minutes."     Patient Active Problem List   Diagnosis Date Noted   Hypertensive emergency 05/30/2020   Hypertensive encephalopathy 05/30/2020   Asymptomatic carotid artery stenosis without infarction, right 12/29/2019   Pancreatic mass    Diabetes ([place]) 11/29/2017   Essential hypertension 11/29/2017   Dyslipidemia 11/29/2017   Myocardial infarction ([place]) 11/29/2017   BPH (benign prostatic hyperplasia) 11/29/2017   SBO (small bowel obstruction) ([place]) 11/29/2017   CKD (chronic kidney disease), stage III ([place]) 11/29/2017   Hx of colonic polyps    History of colonic polyps 10/10/2009    Past Surgical History:  Procedure Laterality Date   [place]   COLONOSCOPY  07/20/2006   Dr. [name]:Status post right hemicolectomy. Residual colonic mucosa appeared normal, normal rectum.    COLONOSCOPY  2006/2007    sprawling villous adenoma at ileocecal valve   COLONOSCOPY  2011   Dr. [name]: normal rectum, pancolonic diverticulosis, 2 diminutive polyps, with path benign polypoid colonic mucosa   COLONOSCOPY N/A 01/02/2015   Procedure: COLONOSCOPY;  Surgeon: Daneil Dolin, MD;  [facility];  Service: Endoscopy;  Laterality: N/A;  1245   CORONARY ANGIOPLASTY     ENDARTERECTOMY Right 12/29/2019   Procedure: ENDARTERECTOMY CAROTID;  Surgeon: [name], MD;  Location: [facility] OR;  Service: Vascular;  Laterality: Right;   ESOPHAGOGASTRODUODENOSCOPY (EGD) WITH PROPOFOL N/A 01/13/2018   Procedure: ESOPHAGOGASTRODUODENOSCOPY (EGD) WITH PROPOFOL;  Surgeon: [name], MD;  [facility];  Service: Endoscopy;  Laterality: N/A;   EUS N/A 01/13/2018   Procedure: UPPER ENDOSCOPIC ULTRASOUND (EUS) RADIAL;  Surgeon: [name], MD;  [facility];  Service: Endoscopy;  Laterality: N/A;   FINE NEEDLE ASPIRATION N/A 01/13/2018   Procedure: FINE NEEDLE ASPIRATION (FNA) LINEAR;  Surgeon: [name], MD;  [facility];  Service: Endoscopy;  Laterality: N/A;   open hemicolectomy  2007   due to villous adenoma   PATCH ANGIOPLASTY Right 12/29/2019   Procedure: RIGHT CAROTID PATCH ANGIOPLASTY USING HEMASHIELD PLATINUM FINESSE;  Surgeon: [name], MD;  Location: [facility] OR;  Service: Vascular;  Laterality: Right;       Family History  Problem Relation Age of Onset   Ulcers  Father    Diabetes type II Brother    Diabetes type II Brother    Colon cancer Neg Hx     Social History   Tobacco Use   Smoking status: Former    Types: Cigars   Smokeless tobacco: Never  Vaping Use   Vaping Use: Never used  Substance Use Topics   Alcohol use: No    Alcohol/week: 0.0 standard drinks   Drug use: No    Home Medications Prior to Admission medications   Medication Sig Start Date End Date Taking? Authorizing Provider  amLODipine (NORVASC) 10 MG tablet Take 1 tablet (10 mg  total) by mouth daily. 03/11/21   [name]., NP  aspirin 81 MG tablet Take 81 mg by mouth at bedtime.     [provider]  chlorthalidone (HYGROTON) 25 MG tablet Take 1 tablet (25 mg total) by mouth daily. 03/28/18 01/14/22  Arnoldo Lenis, MD  cloNIDine (CATAPRES) 0.2 MG tablet Take 1 tablet (0.2 mg total) by mouth 2 (two) times daily. 06/17/20   Strader, Fransisco Hertz, PA-C  diphenhydramine-acetaminophen (TYLENOL PM) 25-500 MG TABS tablet Take 1 tablet by mouth at bedtime as needed (sleep).    [provider]  hydrALAZINE (APRESOLINE) 100 MG tablet Take 1 tablet (100 mg total) by mouth 3 (three) times daily. Dose change. 05/28/20   Strader, Fransisco Hertz, PA-C  insulin aspart (NOVOLOG) 100 UNIT/ML FlexPen Inject 15 Units into the skin 2 (two) times daily.    [provider]  LANTUS SOLOSTAR 100 UNIT/ML Solostar Pen Inject 45 Units into the skin 2 (two) times daily. 11/27/14   [provider]  magnesium gluconate (MAGONATE) 500 MG tablet Take 500 mg by mouth daily.    [provider]  metFORMIN (GLUCOPHAGE) 500 MG tablet Take 500 mg by mouth in the morning and at bedtime.    [provider]  olmesartan (BENICAR) 40 MG tablet Take 40 mg by mouth daily. 09/09/20   [provider]  rosuvastatin (CRESTOR) 10 MG tablet Take 40 mg by mouth daily.    [provider]  tamsulosin (FLOMAX) 0.4 MG CAPS capsule Take 0.4 mg by mouth 2 (two) times daily. 11/05/14   [provider]    Allergies    Patient has no known allergies.  Review of Systems   Review of Systems  Constitutional:  Negative for appetite change and fatigue.  HENT:  Negative for congestion, ear discharge and sinus pressure.   Eyes:  Negative for discharge.  Respiratory:  Negative for cough.   Cardiovascular:  Negative for chest pain.  Gastrointestinal:  Negative for abdominal pain and diarrhea.  Genitourinary:  Negative for frequency and hematuria.   Musculoskeletal:  Negative for back pain.  Skin:  Negative for rash.  Neurological:  Positive for dizziness. Negative for seizures and headaches.  Psychiatric/Behavioral:  Negative for hallucinations.    Physical Exam Updated Vital Signs BP (!) 153/66   Pulse 81   Temp 98.9 F (37.2 C) (Oral)   Resp 17   Ht 5' 10"  (1.778 m)   Wt 92.1 kg   SpO2 95%   BMI 29.13 kg/m   Physical Exam Vitals and nursing note reviewed.  Constitutional:      Appearance: He is well-developed.  HENT:     Head: Normocephalic.     Comments: 3 cm laceration to forehead    Nose: Nose normal.  Eyes:     General: No scleral icterus.    Conjunctiva/sclera:  Conjunctivae normal.  Neck:     Thyroid: No thyromegaly.  Cardiovascular:     Rate and Rhythm: Normal rate and regular rhythm.     Heart sounds: No murmur heard.   No friction rub. No gallop.  Pulmonary:     Breath sounds: No stridor. No wheezing or rales.  Chest:     Chest wall: No tenderness.  Abdominal:     General: There is no distension.     Tenderness: There is no abdominal tenderness. There is no rebound.  Musculoskeletal:        General: Normal range of motion.     Cervical back: Neck supple.  Lymphadenopathy:     Cervical: No cervical adenopathy.  Skin:    Findings: No erythema or rash.     Comments: 3 cm laceration forehead  Neurological:     Mental Status: He is alert and oriented to person, place, and time.     Motor: No abnormal muscle tone.     Coordination: Coordination normal.  Psychiatric:        Behavior: Behavior normal.    ED Results / Procedures / Treatments   Labs (all labs ordered are listed, but only abnormal results are displayed) Labs Reviewed  CBC WITH DIFFERENTIAL/PLATELET - Abnormal; Notable for the following components:      Result Value   WBC 10.8 (*)    Neutro Abs 8.1 (*)    All other components within normal limits  COMPREHENSIVE METABOLIC PANEL - Abnormal; Notable for the following components:    Sodium 134 (*)    Glucose, Bld 194 (*)    BUN 46 (*)    Creatinine, Ser 2.02 (*)    Total Bilirubin 0.2 (*)    GFR, Estimated 31 (*)    All other components within normal limits  CBG MONITORING, ED - Abnormal; Notable for the following components:   Glucose-Capillary 192 (*)    All other components within normal limits    EKG EKG Interpretation  Date/Time:  Sunday March 23 2021 18:32:29 EDT Ventricular Rate:  73 PR Interval:  178 QRS Duration: 112 QT Interval:  405 QTC Calculation: 447 R Axis:   28 Text Interpretation: Sinus rhythm Incomplete left bundle branch block Inferior infarct, old Confirmed by Milton Ferguson [phone]) on 03/23/2021 6:55:34 PM  Radiology CT Head Wo Contrast  Result Date: 03/23/2021 CLINICAL DATA:  Head trauma, focal neuro findings (Age 7-55y) 87 year old post ankle below episode while using the bathroom. EXAM: CT HEAD WITHOUT CONTRAST TECHNIQUE: Contiguous axial images were obtained from the base of the skull through the vertex without intravenous contrast. COMPARISON:  Head CT and brain MRI 05/30/2020 FINDINGS: Brain: Normal for age atrophy. No intracranial hemorrhage, mass effect, or midline shift. Unchanged mineralization in the right basal ganglia. Remote lacunar infarct in the left basal ganglia. No hydrocephalus. The basilar cisterns are patent. Mild periventricular chronic small vessel ischemia. No evidence of territorial infarct or acute ischemia. No extra-axial or intracranial fluid collection. Vascular: Atherosclerosis of skullbase vasculature without hyperdense vessel or abnormal calcification. Skull: No fracture or focal lesion. Sinuses/Orbits: No acute findings. No mastoid effusion. Minimal mucous retention cyst in the right maxillary sinus. Chronic defect of the right lamina papyracea. Bilateral cataract resection. Other: No confluent scalp hematoma. IMPRESSION: 1. No acute intracranial abnormality. No skull fracture. 2. Unchanged atrophy and  chronic small vessel ischemia. Remote lacunar infarct in the left basal ganglia. Electronically Signed   By: Keith Rake M.D.   On: 03/23/2021 19:33  Procedures .Marland KitchenLaceration Repair  Date/Time: 03/28/2021 9:02 AM Performed by: Milton Ferguson, MD Authorized by: Milton Ferguson, MD   Comments:     Patient fell and hit his head.  He has a 3 cm laceration to forehead.  Area was cleaned thoroughly with Betadine.  No anesthesia was used.  Patient had 5 staples used to close laceration.  Patient tolerated procedure well   Medications Ordered in ED Medications  sodium chloride 0.9 % bolus 1,000 mL (0 mLs Intravenous Stopped 03/23/21 2111)    ED Course  I have reviewed the triage vital signs and the nursing notes.  Pertinent labs & imaging results that were available during my care of the patient were reviewed by me and considered in my medical decision making (see chart for details).    MDM Rules/Calculators/A&P                           Patient with orthostatic and he improved with IV fluids.  We will stop his chlorthalidone.  He has a 3 cm laceration to his forehead that was closed with staples.  Patient had 5 staples Final Clinical Impression(s) / ED Diagnoses Final diagnoses:  Fall, initial encounter    Rx / DC Orders ED Discharge Orders     None        Milton Ferguson, MD 03/28/21 [phone]

## 2021-03-24 ENCOUNTER — Telehealth: Payer: Self-pay | Admitting: Cardiology

## 2021-03-24 DIAGNOSIS — R55 Syncope and collapse: Secondary | ICD-10-CM | POA: Diagnosis not present

## 2021-03-24 NOTE — Telephone Encounter (Signed)
Patient's friend was calling to see if they should wait on putting the montior on until after his procedure on Wednesday. Please advise

## 2021-03-24 NOTE — Telephone Encounter (Signed)
Dorna Bloom notified that it is ok to place monitor before having renal US done.

## 2021-03-26 ENCOUNTER — Telehealth: Payer: Self-pay | Admitting: Student

## 2021-03-26 ENCOUNTER — Ambulatory Visit (HOSPITAL_COMMUNITY)
Admission: RE | Admit: 2021-03-26 | Discharge: 2021-03-26 | Disposition: A | Discharge status: Home / Self Care | Payer: Medicare HMO | Source: Ambulatory Visit | Attending: Cardiology | Admitting: Cardiology

## 2021-03-26 ENCOUNTER — Other Ambulatory Visit: Payer: Self-pay

## 2021-03-26 DIAGNOSIS — I10 Essential (primary) hypertension: Secondary | ICD-10-CM | POA: Insufficient documentation

## 2021-03-26 NOTE — Telephone Encounter (Signed)
   Patient's friend Jonathan Avery) called Answering Service about hypoglycemia. Patient has renal ultrasound scheduled for 10am this morning and has been NPO since midnight. He is diabetic and blood sugar this morning 64. He has no signs of hypoglycemia. Friend is wondering if patient can have some orange juice. Discussed reasoning for keeping patient's NPO since midnight but OK to drink small amount of orange juice given blood sugar is low and we still have a couple more hours until procedure. However, advised to only drink a small amount. Jonathan Avery voiced understanding and agreed.  Darreld Mclean, PA-C 03/26/2021 7:52 AM

## 2021-03-27 ENCOUNTER — Telehealth: Payer: Self-pay | Admitting: Cardiology

## 2021-03-27 NOTE — Telephone Encounter (Signed)
Patient's friend calling for vascular test results.

## 2021-03-27 NOTE — Telephone Encounter (Signed)
Laurine Blazer, LPN  04/25/2410  4:64 PM EDT Back to Top    Notified friend Mortimer Fries [place]), copy to pcp.    Arnoldo Lenis, MD  03/26/2021  1:00 PM EDT     No significant blockages in the renal arteries     Zandra Abts MD

## 2021-03-28 DIAGNOSIS — S0100XA Unspecified open wound of scalp, initial encounter: Secondary | ICD-10-CM | POA: Diagnosis not present

## 2021-04-07 ENCOUNTER — Other Ambulatory Visit: Payer: Self-pay | Admitting: General Surgery

## 2021-04-07 DIAGNOSIS — K8689 Other specified diseases of pancreas: Secondary | ICD-10-CM

## 2021-04-08 DIAGNOSIS — E785 Hyperlipidemia, unspecified: Secondary | ICD-10-CM | POA: Diagnosis not present

## 2021-04-08 DIAGNOSIS — Z8673 Personal history of transient ischemic attack (TIA), and cerebral infarction without residual deficits: Secondary | ICD-10-CM | POA: Diagnosis not present

## 2021-04-08 DIAGNOSIS — E1129 Type 2 diabetes mellitus with other diabetic kidney complication: Secondary | ICD-10-CM | POA: Diagnosis not present

## 2021-04-08 DIAGNOSIS — R001 Bradycardia, unspecified: Secondary | ICD-10-CM | POA: Diagnosis not present

## 2021-04-08 DIAGNOSIS — N1832 Chronic kidney disease, stage 3b: Secondary | ICD-10-CM | POA: Diagnosis not present

## 2021-04-08 DIAGNOSIS — Z79899 Other long term (current) drug therapy: Secondary | ICD-10-CM | POA: Diagnosis not present

## 2021-04-08 DIAGNOSIS — R55 Syncope and collapse: Secondary | ICD-10-CM | POA: Diagnosis not present

## 2021-04-08 DIAGNOSIS — Z1159 Encounter for screening for other viral diseases: Secondary | ICD-10-CM | POA: Diagnosis not present

## 2021-04-08 DIAGNOSIS — I10 Essential (primary) hypertension: Secondary | ICD-10-CM | POA: Diagnosis not present

## 2021-04-10 ENCOUNTER — Encounter: Payer: Self-pay | Admitting: *Deleted

## 2021-04-10 ENCOUNTER — Telehealth: Payer: Self-pay | Admitting: *Deleted

## 2021-04-10 ENCOUNTER — Encounter: Payer: Self-pay | Admitting: Cardiology

## 2021-04-10 MED ORDER — CHLORTHALIDONE 25 MG PO TABS: 25.0000 mg | ORAL_TABLET | Freq: Every day | ORAL | Status: DC

## 2021-04-10 NOTE — Telephone Encounter (Signed)
Laurine Blazer, LPN  92/90/9030  1:49 PM EST Back to Top    Spoke with friend Jonathan Avery [place]) - stated that they went to ED on 10/30 & was told to stay off the Chlorthalidone a few days.  Went back on 03/27/21.  Stated that he did also just have labs with his pcp on 04/08/2021 - requested now.  Advised him to stop the Chlorthalidone all together for now till we can see the recent labs.  [name] made aware.     Laurine Blazer, LPN  96/92/4932  4:19 PM EST     Left message to return call.   Arnoldo Lenis, MD  04/04/2021 11:18 AM EST     Patient needs bmet/mg next week. Agree with him being off chlorthalidone per ER visit, confirm that he stopped taking it please     Zandra Abts MD

## 2021-04-14 ENCOUNTER — Encounter: Payer: Self-pay | Admitting: *Deleted

## 2021-04-14 DIAGNOSIS — Z79899 Other long term (current) drug therapy: Secondary | ICD-10-CM

## 2021-04-14 NOTE — Telephone Encounter (Signed)
Message sent to patient via mychart

## 2021-04-14 NOTE — Telephone Encounter (Signed)
Kidney labs look better off chlorthalidone, would remain off. Does he have any recent home bp's for [place] to decide on perhaps starting another medication, would consider possible aldactone depending on bp's   Zandra Abts MD

## 2021-04-15 DIAGNOSIS — R7301 Impaired fasting glucose: Secondary | ICD-10-CM | POA: Diagnosis not present

## 2021-04-15 DIAGNOSIS — N1832 Chronic kidney disease, stage 3b: Secondary | ICD-10-CM | POA: Diagnosis not present

## 2021-04-15 DIAGNOSIS — E785 Hyperlipidemia, unspecified: Secondary | ICD-10-CM | POA: Diagnosis not present

## 2021-04-15 DIAGNOSIS — I10 Essential (primary) hypertension: Secondary | ICD-10-CM | POA: Diagnosis not present

## 2021-04-15 DIAGNOSIS — E1122 Type 2 diabetes mellitus with diabetic chronic kidney disease: Secondary | ICD-10-CM | POA: Diagnosis not present

## 2021-04-22 ENCOUNTER — Other Ambulatory Visit: Payer: Self-pay

## 2021-04-22 DIAGNOSIS — Z79899 Other long term (current) drug therapy: Secondary | ICD-10-CM

## 2021-04-22 MED ORDER — SPIRONOLACTONE 25 MG PO TABS: 12.5000 mg | ORAL_TABLET | Freq: Every day | ORAL | 3 refills | Status: DC

## 2021-04-22 NOTE — Telephone Encounter (Signed)
Pt agreeable to start Aldactone 12.5 mg tablets daily and BMET in 2 weeks. Pt has stopped Chlorthalidone.

## 2021-04-22 NOTE — Telephone Encounter (Signed)
BP's remain elevated. Verify she stopped chlorthalidone and should be taken off his list. Would start aldactone 12.92m daily with bmet in 2 weeks   JZandra AbtsMD

## 2021-05-01 ENCOUNTER — Ambulatory Visit
Admission: RE | Admit: 2021-05-01 | Discharge: 2021-05-01 | Disposition: A | Discharge status: Home / Self Care | Payer: Medicare HMO | Source: Ambulatory Visit | Attending: General Surgery | Admitting: General Surgery

## 2021-05-01 ENCOUNTER — Other Ambulatory Visit: Payer: Self-pay

## 2021-05-01 DIAGNOSIS — L570 Actinic keratosis: Secondary | ICD-10-CM | POA: Diagnosis not present

## 2021-05-01 DIAGNOSIS — K8689 Other specified diseases of pancreas: Secondary | ICD-10-CM | POA: Diagnosis not present

## 2021-05-01 DIAGNOSIS — X32XXXD Exposure to sunlight, subsequent encounter: Secondary | ICD-10-CM | POA: Diagnosis not present

## 2021-05-01 DIAGNOSIS — K5730 Diverticulosis of large intestine without perforation or abscess without bleeding: Secondary | ICD-10-CM | POA: Diagnosis not present

## 2021-05-01 DIAGNOSIS — L820 Inflamed seborrheic keratosis: Secondary | ICD-10-CM | POA: Diagnosis not present

## 2021-05-01 MED ORDER — GADOBENATE DIMEGLUMINE 529 MG/ML IV SOLN
19.0000 mL | Freq: Once | INTRAVENOUS | Status: AC | PRN
  Administered 2021-05-01: 19 mL via INTRAVENOUS

## 2021-05-06 DIAGNOSIS — Z79899 Other long term (current) drug therapy: Secondary | ICD-10-CM | POA: Diagnosis not present

## 2021-05-07 ENCOUNTER — Encounter: Payer: Self-pay | Admitting: Cardiology

## 2021-05-07 LAB — BASIC METABOLIC PANEL
BUN/Creatinine Ratio: 24 (ref 10–24)
BUN: 42 mg/dL — ABNORMAL HIGH (ref 8–27)
CO2: 17 mmol/L — ABNORMAL LOW (ref 20–29)
Calcium: 9.4 mg/dL (ref 8.6–10.2)
Chloride: 107 mmol/L — ABNORMAL HIGH (ref 96–106)
Creatinine, Ser: 1.72 mg/dL — ABNORMAL HIGH (ref 0.76–1.27)
Glucose: 174 mg/dL — ABNORMAL HIGH (ref 70–99)
Potassium: 5.1 mmol/L (ref 3.5–5.2)
Sodium: 139 mmol/L (ref 134–144)
eGFR: 38 mL/min/{1.73_m2} — ABNORMAL LOW (ref >59)

## 2021-05-12 NOTE — Telephone Encounter (Signed)
Im ok with those numbers if not having significant symptoms. Is the staggering something that's brand new since medication change? Any specific new dizziness with standing?   Zandra Abts MD

## 2021-05-15 NOTE — Telephone Encounter (Signed)
Can try taking the aldactone every other day and see how symptoms do  Zandra Abts MD

## 2021-05-16 ENCOUNTER — Encounter (HOSPITAL_BASED_OUTPATIENT_CLINIC_OR_DEPARTMENT_OTHER): Payer: Self-pay | Admitting: Internal Medicine

## 2021-05-20 ENCOUNTER — Encounter (HOSPITAL_BASED_OUTPATIENT_CLINIC_OR_DEPARTMENT_OTHER): Payer: Self-pay | Admitting: Internal Medicine

## 2021-05-21 ENCOUNTER — Telehealth: Payer: Self-pay | Admitting: Cardiology

## 2021-05-21 ENCOUNTER — Encounter: Payer: Self-pay | Admitting: Cardiology

## 2021-05-21 NOTE — Telephone Encounter (Signed)
Wynelle Fanny, Friend of the patient was returning a call from the office to go over the patient's lab results. Please call back

## 2021-05-22 ENCOUNTER — Encounter: Payer: Self-pay | Admitting: Cardiology

## 2021-05-23 NOTE — Telephone Encounter (Signed)
Ok to hold off on that appointment for now  Zandra Abts MD

## 2021-05-27 NOTE — Telephone Encounter (Signed)
Labs show some renal dysfunction but remains improved from levels a few months ago, continue to monitor   Zandra Abts MD

## 2021-05-29 ENCOUNTER — Ambulatory Visit (HOSPITAL_BASED_OUTPATIENT_CLINIC_OR_DEPARTMENT_OTHER): Payer: Medicare HMO | Admitting: Cardiovascular Disease

## 2021-07-09 DIAGNOSIS — I2510 Atherosclerotic heart disease of native coronary artery without angina pectoris: Secondary | ICD-10-CM | POA: Diagnosis not present

## 2021-07-09 DIAGNOSIS — E785 Hyperlipidemia, unspecified: Secondary | ICD-10-CM | POA: Diagnosis not present

## 2021-07-09 DIAGNOSIS — N1832 Chronic kidney disease, stage 3b: Secondary | ICD-10-CM | POA: Diagnosis not present

## 2021-07-09 DIAGNOSIS — Z79899 Other long term (current) drug therapy: Secondary | ICD-10-CM | POA: Diagnosis not present

## 2021-07-09 DIAGNOSIS — I10 Essential (primary) hypertension: Secondary | ICD-10-CM | POA: Diagnosis not present

## 2021-07-09 DIAGNOSIS — E1129 Type 2 diabetes mellitus with other diabetic kidney complication: Secondary | ICD-10-CM | POA: Diagnosis not present

## 2021-07-09 DIAGNOSIS — N400 Enlarged prostate without lower urinary tract symptoms: Secondary | ICD-10-CM | POA: Diagnosis not present

## 2021-07-16 DIAGNOSIS — E1122 Type 2 diabetes mellitus with diabetic chronic kidney disease: Secondary | ICD-10-CM | POA: Diagnosis not present

## 2021-07-16 DIAGNOSIS — N1832 Chronic kidney disease, stage 3b: Secondary | ICD-10-CM | POA: Diagnosis not present

## 2021-07-16 DIAGNOSIS — I10 Essential (primary) hypertension: Secondary | ICD-10-CM | POA: Diagnosis not present

## 2021-08-14 ENCOUNTER — Encounter: Payer: Self-pay | Admitting: Cardiology

## 2021-08-14 ENCOUNTER — Ambulatory Visit: Payer: Medicare HMO | Admitting: Cardiology

## 2021-08-14 VITALS — BP 142/58 | HR 62 | Ht 70.0 in | Wt 205.2 lb

## 2021-08-14 DIAGNOSIS — R001 Bradycardia, unspecified: Secondary | ICD-10-CM | POA: Diagnosis not present

## 2021-08-14 DIAGNOSIS — I10 Essential (primary) hypertension: Secondary | ICD-10-CM

## 2021-08-14 DIAGNOSIS — E782 Mixed hyperlipidemia: Secondary | ICD-10-CM | POA: Diagnosis not present

## 2021-08-14 DIAGNOSIS — I2510 Atherosclerotic heart disease of native coronary artery without angina pectoris: Secondary | ICD-10-CM

## 2021-08-14 MED ORDER — CLONIDINE HCL 0.1 MG PO TABS: 0.1000 mg | ORAL_TABLET | Freq: Two times a day (BID) | ORAL | 3 refills | Status: DC

## 2021-08-14 MED ORDER — SPIRONOLACTONE 25 MG PO TABS: 12.5000 mg | ORAL_TABLET | Freq: Two times a day (BID) | ORAL | 6 refills | Status: DC

## 2021-08-14 NOTE — Patient Instructions (Signed)
Medication Instructions:  ?Decrease Clonidine to 0.70m twice a day   ?Increase Aldactone to 12.552mtwice a day  ?Continue all other medications.    ? ?Labwork: ?BMET - order given today ?Please do in 2 weeks (around 4/6/ 2023) ?Office will contact with results via phone or letter.    ? ?Testing/Procedures: ?none ? ?Follow-Up: ?6 months  ? ?Any Other Special Instructions Will Be Listed Below (If Applicable). ? ? ?If you need a refill on your cardiac medications before your next appointment, please call your pharmacy. ? ?

## 2021-08-14 NOTE — Progress Notes (Signed)
? ? ? ?Clinical Summary ?Mr. Gentzler is a 88 y.o.male seen today for follow up of the following medical problems.  ? ?1. History of CAD/MI ?- notes metion history of MI 20 years ago with PTCA at [facility]. No records available in epic. Patient denies any recurrent ischemic events.  ? ? ? Metoprolol stopped in the past due to low HRs and fatigue ?- no recent chest pains, no SOb/DOE ?- compliant with meds ? ?2. Bradycardia ?- lopressor was stopped.  ?- home HRs still high 40s at times ?- no symptoms. ?  ?  ?2.  HTN ?- compliant with meds ?-does not check bp at home.  ? ?Takes meds 7AM, checks bp 9AM. AM bp's 110s-130s, occasionally 150s. Evening bp's around 5pm, typically 140s-180s at times.  ?- off chlorthaldone due to polyuria, also AKI.  ?  ?3. DM2 ? ?  ?4. Hyperlipidemia ?- 02/2018 TC 166 TG 477 HDL 27 ?- 03/2020 TC 116 TG 150 HDL 31 LDL 59 ? ?5. Carotid stensosis ?01/5620 RICA 3-08%, LICA 65-78% ?- repeat US in June with appt with Dr Donnetta Hutching.  ?  ?  ? ?  ?Past Medical History:  ?Diagnosis Date  ? BPH (benign prostatic hypertrophy)   ? Cancer [facility])   ? skin cancer - basal  ? Carotid artery occlusion   ? Diabetes ([place])   ? Type II  ? High cholesterol   ? Hypertension   ? Myocardial infarction [facility])   ? Peripheral vascular disease ([place])   ? Stroke [facility])   ?   documented 12/28/2019- ? TIA  " weeks 3 weeks ago, expresive aphasia lasted 2 -3 minutes."   ? ? ? ?No Known Allergies ? ? ?Current Outpatient Medications  ?Medication Sig Dispense Refill  ? amLODipine (NORVASC) 10 MG tablet Take 1 tablet (10 mg total) by mouth daily. 90 tablet 1  ? aspirin 81 MG tablet Take 81 mg by mouth at bedtime.     ? cloNIDine (CATAPRES) 0.2 MG tablet Take 1 tablet (0.2 mg total) by mouth 2 (two) times daily. 60 tablet 11  ? diphenhydramine-acetaminophen (TYLENOL PM) 25-500 MG TABS tablet Take 1 tablet by mouth at bedtime as needed (sleep).    ? hydrALAZINE (APRESOLINE) 100 MG tablet Take 1 tablet (100 mg total) by mouth 3 (three) times  daily. Dose change. 180 tablet 5  ? insulin aspart (NOVOLOG) 100 UNIT/ML FlexPen Inject 15 Units into the skin 2 (two) times daily.    ? LANTUS SOLOSTAR 100 UNIT/ML Solostar Pen Inject 45 Units into the skin 2 (two) times daily.    ? magnesium gluconate (MAGONATE) 500 MG tablet Take 500 mg by mouth daily.    ? metFORMIN (GLUCOPHAGE) 500 MG tablet Take 500 mg by mouth in the morning and at bedtime.    ? olmesartan (BENICAR) 40 MG tablet Take 40 mg by mouth daily.    ? rosuvastatin (CRESTOR) 10 MG tablet Take 40 mg by mouth daily.    ? spironolactone (ALDACTONE) 25 MG tablet Take 0.5 tablets (12.5 mg total) by mouth daily. 45 tablet 3  ? tamsulosin (FLOMAX) 0.4 MG CAPS capsule Take 0.4 mg by mouth 2 (two) times daily.    ? ?No current facility-administered medications for this visit.  ? ? ? ?Past Surgical History:  ?Procedure Laterality Date  ? CARDIAC CATHETERIZATION    ? CHOLECYSTECTOMY  1973  ? COLONOSCOPY  07/20/2006  ? Dr. [name]:Status post right hemicolectomy. Residual colonic mucosa appeared normal, normal rectum.   ?  COLONOSCOPY  2006/2007  ? sprawling villous adenoma at ileocecal valve  ? COLONOSCOPY  2011  ? Dr. [name]: normal rectum, pancolonic diverticulosis, 2 diminutive polyps, with path benign polypoid colonic mucosa  ? COLONOSCOPY N/A 01/02/2015  ? Procedure: COLONOSCOPY;  Surgeon: Daneil Dolin, MD;  [facility];  Service: Endoscopy;  Laterality: N/A;  1245  ? CORONARY ANGIOPLASTY    ? ENDARTERECTOMY Right 12/29/2019  ? Procedure: ENDARTERECTOMY CAROTID;  Surgeon: [name], MD;  Location: [facility] OR;  Service: Vascular;  Laterality: Right;  ? ESOPHAGOGASTRODUODENOSCOPY (EGD) WITH PROPOFOL N/A 01/13/2018  ? Procedure: ESOPHAGOGASTRODUODENOSCOPY (EGD) WITH PROPOFOL;  Surgeon: [name], MD;  [facility];  Service: Endoscopy;  Laterality: N/A;  ? EUS N/A 01/13/2018  ? Procedure: UPPER ENDOSCOPIC ULTRASOUND (EUS) RADIAL;  Surgeon: [name], MD;  [facility];   Service: Endoscopy;  Laterality: N/A;  ? FINE NEEDLE ASPIRATION N/A 01/13/2018  ? Procedure: FINE NEEDLE ASPIRATION (FNA) LINEAR;  Surgeon: [name], MD;  [facility];  Service: Endoscopy;  Laterality: N/A;  ? open hemicolectomy  2007  ? due to villous adenoma  ? PATCH ANGIOPLASTY Right 12/29/2019  ? Procedure: RIGHT CAROTID PATCH ANGIOPLASTY USING HEMASHIELD PLATINUM FINESSE;  Surgeon: [name], MD;  Location: [place];  Service: Vascular;  Laterality: Right;  ? ? ? ?No Known Allergies ? ? ? ?Family History  ?Problem Relation Age of Onset  ? Ulcers Father   ? Diabetes type II Brother   ? Diabetes type II Brother   ? Colon cancer Neg Hx   ? ? ? ?Social History ?Mr. Ojeda reports that he has quit smoking. His smoking use included cigars. He has never used smokeless tobacco. ?Mr. Marron reports no history of alcohol use. ? ? ?Review of Systems ?CONSTITUTIONAL: No weight loss, fever, chills, weakness or fatigue.  ?HEENT: Eyes: No visual loss, blurred vision, double vision or yellow sclerae.No hearing loss, sneezing, congestion, runny nose or sore throat.  ?SKIN: No rash or itching.  ?CARDIOVASCULAR: per hpi ?RESPIRATORY: No shortness of breath, cough or sputum.  ?GASTROINTESTINAL: No anorexia, nausea, vomiting or diarrhea. No abdominal pain or blood.  ?GENITOURINARY: No burning on urination, no polyuria ?NEUROLOGICAL: No headache, dizziness, syncope, paralysis, ataxia, numbness or tingling in the extremities. No change in bowel or bladder control.  ?MUSCULOSKELETAL: No muscle, back pain, joint pain or stiffness.  ?LYMPHATICS: No enlarged nodes. No history of splenectomy.  ?PSYCHIATRIC: No history of depression or anxiety.  ?ENDOCRINOLOGIC: No reports of sweating, cold or heat intolerance. No polyuria or polydipsia.  ?. ? ? ?Physical Examination ?Today's Vitals  ? 08/14/21 0957  ?BP: (!) 142/58  ?Pulse: 62  ?SpO2: 98%  ?Weight: 205 lb 3.2 oz (93.1 kg)  ?Height: 5' 10"  (1.778 m)  ? ?Body mass index is  29.44 kg/m?. ? ?Gen: resting comfortably, no acute distress ?HEENT: no scleral icterus, pupils equal round and reactive, no palptable cervical adenopathy,  ?CV: RRR, n m/r/ gno jvd ?Resp: Clear to auscultation bilaterally ?GI: abdomen is soft, non-tender, non-distended, normal bowel sounds, no hepatosplenomegaly ?MSK: extremities are warm, no edema.  ?Skin: warm, no rash ?Neuro:  no focal deficits ?Psych: appropriate affect ? ? ?Diagnostic Studies ? ? ?03/2021 Renal artery [place] ?Renal:  ?   ?Right: Normal size right kidney. Abnormal right Resistive Index.  ?       Abnormal cortical thickness of right kidney. 1-59% stenosis  ?       of the  right renal artery. RRV flow present. Cyst(s) noted.  ?       Avascular cystic mass noted in the mid pole of the right  ?       kidney, measuring 1.3 x 1.2 x 1.3 cm.  ?Left:  Normal size of left kidney. Abnormal left Resistive Index.  ?       Abnormal cortical thickness of the left kidney. 1-59%  ?       stenosis of the left renal artery. LRV flow present. Cyst(s)  ?       noted. Avascular cystic mass in the mid pole of the left  ?       kidney, measuring 3.8 x 3.6 x 3.9 cm.  ?Mesenteric:  ?Normal Celiac artery and Superior Mesenteric artery findings.  ? ? ?03/2021 monitor ?Patch wear time was 6 days and 18hours ?Predominant rhythm is NSR with average HR 57bpm (ranging 42-141bpm) ?One run of nonsustained VT lasting 4 beats ?Six runs of nonsustained SVT with longest 8 beats at 101bpm ?Rare SVE, rare VE (<1%) ?No sustained arrhythmias or significant pauses ?  ?  ?Patch Wear Time:  6 days and 18 hours (2022-10-31T15:18:10-398 to 2022-11-07T08:43:32-0500) ?  ?Patient had a min HR of 42 bpm, max HR of 141 bpm, and avg HR of 57 bpm. Predominant underlying rhythm was Sinus Rhythm. 1 run of Ventricular Tachycardia occurred lasting 4 beats with a max rate of 141 bpm (avg 128 bpm). 6 Supraventricular Tachycardia  ?runs occurred, the run with the fastest interval lasting 6 beats with a max  rate of 128 bpm, the longest lasting 8 beats with an avg rate of 101 bpm. Isolated SVEs were rare (<1.0%), SVE Couplets were rare (<1.0%), and SVE Triplets were rare (<1.0%).  ?Isolated VEs were rare (<1.0%, 591)

## 2021-08-18 ENCOUNTER — Encounter: Payer: Self-pay | Admitting: *Deleted

## 2021-08-18 ENCOUNTER — Encounter: Payer: Self-pay | Admitting: Cardiology

## 2021-08-26 NOTE — Telephone Encounter (Signed)
Gland HRs have improved, that was the main goal was to increase those. BP's are high at times, as we talked before his top and bottom number are spread wide apart which can happen with aging but makes more difficult to keep top number down without getting bottom number too low. All things considered I would accept the current numbers where they are ? ?J Perfecto Purdy MD ?

## 2021-08-28 DIAGNOSIS — I10 Essential (primary) hypertension: Secondary | ICD-10-CM | POA: Diagnosis not present

## 2021-08-28 DIAGNOSIS — I2510 Atherosclerotic heart disease of native coronary artery without angina pectoris: Secondary | ICD-10-CM | POA: Diagnosis not present

## 2021-08-29 LAB — BASIC METABOLIC PANEL
BUN/Creatinine Ratio: 22 (ref 10–24)
BUN: 45 mg/dL — ABNORMAL HIGH (ref 8–27)
CO2: 17 mmol/L — ABNORMAL LOW (ref 20–29)
Calcium: 9.1 mg/dL (ref 8.6–10.2)
Chloride: 105 mmol/L (ref 96–106)
Creatinine, Ser: 2.03 mg/dL — ABNORMAL HIGH (ref 0.76–1.27)
Glucose: 132 mg/dL — ABNORMAL HIGH (ref 70–99)
Potassium: 5.1 mmol/L (ref 3.5–5.2)
Sodium: 137 mmol/L (ref 134–144)
eGFR: 31 mL/min/{1.73_m2} — ABNORMAL LOW (ref >59)

## 2021-09-04 ENCOUNTER — Telehealth: Payer: Self-pay

## 2021-09-04 DIAGNOSIS — N1831 Chronic kidney disease, stage 3a: Secondary | ICD-10-CM

## 2021-09-04 DIAGNOSIS — Z79899 Other long term (current) drug therapy: Secondary | ICD-10-CM

## 2021-09-04 NOTE — Telephone Encounter (Signed)
Results discussed with patient,will repeat bmet at [employer] [facility] in 1 month ?

## 2021-09-04 NOTE — Telephone Encounter (Signed)
-----   Message from Arnoldo Lenis, MD sent at 09/03/2021  3:51 PM EDT ----- ?Labs show kidney function is decrased but similar to prior range. REpeat bmet in 1 month please ? ? ?Zandra Abts MD ?

## 2021-09-05 ENCOUNTER — Emergency Department (HOSPITAL_COMMUNITY): Payer: Medicare HMO

## 2021-09-05 ENCOUNTER — Emergency Department (HOSPITAL_COMMUNITY)
Admission: EM | Admit: 2021-09-05 | Discharge: 2021-09-05 | Disposition: A | Discharge status: Home / Self Care | Payer: Medicare HMO | Attending: Emergency Medicine | Admitting: Emergency Medicine

## 2021-09-05 ENCOUNTER — Encounter (HOSPITAL_COMMUNITY): Payer: Self-pay | Admitting: *Deleted

## 2021-09-05 ENCOUNTER — Other Ambulatory Visit: Payer: Self-pay

## 2021-09-05 DIAGNOSIS — S68119A Complete traumatic metacarpophalangeal amputation of unspecified finger, initial encounter: Secondary | ICD-10-CM

## 2021-09-05 DIAGNOSIS — Z7982 Long term (current) use of aspirin: Secondary | ICD-10-CM | POA: Insufficient documentation

## 2021-09-05 DIAGNOSIS — Z794 Long term (current) use of insulin: Secondary | ICD-10-CM | POA: Diagnosis not present

## 2021-09-05 DIAGNOSIS — Z7984 Long term (current) use of oral hypoglycemic drugs: Secondary | ICD-10-CM | POA: Insufficient documentation

## 2021-09-05 DIAGNOSIS — S68112A Complete traumatic metacarpophalangeal amputation of right middle finger, initial encounter: Secondary | ICD-10-CM | POA: Insufficient documentation

## 2021-09-05 DIAGNOSIS — S60942A Unspecified superficial injury of right middle finger, initial encounter: Secondary | ICD-10-CM | POA: Diagnosis present

## 2021-09-05 DIAGNOSIS — W268XXA Contact with other sharp object(s), not elsewhere classified, initial encounter: Secondary | ICD-10-CM | POA: Insufficient documentation

## 2021-09-05 MED ORDER — CEPHALEXIN 500 MG PO CAPS
500.0000 mg | ORAL_CAPSULE | Freq: Once | ORAL | Status: AC
  Administered 2021-09-05: 500 mg via ORAL
  Filled 2021-09-05: qty 1

## 2021-09-05 MED ORDER — BUPIVACAINE HCL (PF) 0.25 % IJ SOLN
10.0000 mL | Freq: Once | INTRAMUSCULAR | Status: AC
Start: 2021-09-05 — End: 2021-09-05
  Administered 2021-09-05: 10 mL
  Filled 2021-09-05: qty 30

## 2021-09-05 MED ORDER — CEPHALEXIN 500 MG PO CAPS: 500.0000 mg | ORAL_CAPSULE | Freq: Four times a day (QID) | ORAL | 0 refills | Status: DC

## 2021-09-05 MED ORDER — TRAMADOL HCL 50 MG PO TABS: 50.0000 mg | ORAL_TABLET | Freq: Four times a day (QID) | ORAL | 0 refills | Status: DC | PRN

## 2021-09-05 NOTE — Discharge Instructions (Signed)
Elevate your right hand above your heart is much as possible to decrease pain and swelling.  Call Dr. Aline Brochure on Monday morning to schedule follow-up appointment to be seen soon as possible.  Get the prescription sent to your pharmacy, first thing tomorrow morning.  Take them as directed.  Return here, if needed for problems. ?

## 2021-09-05 NOTE — ED Provider Notes (Signed)
?[facility] ?Provider Note ? ? ?CSN: [id number] ?Arrival date & time: 09/05/21  1556 ? ?  ? ?History ? ?Chief Complaint  ?Patient presents with  ? Laceration  ? ? ?Jonathan Avery is a 88 y.o. male. ? ?HPI ?Patient was maneuvering a trailer to attach it when he excellently got his finger stuck beneath part of the structure.  He had to jerk his finger out because it was pinned.  He noticed that the end of his finger was missing afterwards.  Last tetanus booster 12/21/2016. ?  ? ?Home Medications ?Prior to Admission medications   ?Medication Sig Start Date End Date Taking? Authorizing Provider  ?amLODipine (NORVASC) 10 MG tablet Take 1 tablet (10 mg total) by mouth daily. 03/11/21  Yes [name]., NP  ?aspirin 81 MG tablet Take 81 mg by mouth at bedtime.    Yes [provider]  ?cephALEXin (KEFLEX) 500 MG capsule Take 1 capsule (500 mg total) by mouth 4 (four) times daily. 09/05/21  Yes Daleen Bo, MD  ?Cholecalciferol (VITAMIN D3) 10 MCG (400 UNIT) CAPS Take 2 capsules by mouth daily.   Yes [provider]  ?cloNIDine (CATAPRES) 0.1 MG tablet Take 1 tablet (0.1 mg total) by mouth 2 (two) times daily. 08/14/21  Yes BranchAlphonse Guild, MD  ?diphenhydramine-acetaminophen (TYLENOL PM) 25-500 MG TABS tablet Take 1 tablet by mouth at bedtime as needed (sleep).   Yes [provider]  ?hydrALAZINE (APRESOLINE) 100 MG tablet Take 1 tablet (100 mg total) by mouth 3 (three) times daily. Dose change. 05/28/20  Yes Strader, [place] M, PA-C  ?insulin aspart (NOVOLOG) 100 UNIT/ML FlexPen Inject 15 Units into the skin daily as needed for high blood sugar.   Yes [provider]  ?LANTUS SOLOSTAR 100 UNIT/ML Solostar Pen Inject 30 Units into the skin 2 (two) times daily. 11/27/14  Yes [provider]  ?magnesium gluconate (MAGONATE) 500 MG tablet Take 500 mg by mouth daily.   Yes [provider]  ?metFORMIN (GLUCOPHAGE) 500 MG tablet Take 500 mg by mouth in  the morning and at bedtime.   Yes [provider]  ?olmesartan (BENICAR) 40 MG tablet Take 40 mg by mouth daily. 09/09/20  Yes [provider]  ?rosuvastatin (CRESTOR) 10 MG tablet Take 10 mg by mouth daily.   Yes [provider]  ?spironolactone (ALDACTONE) 25 MG tablet Take 0.5 tablets (12.5 mg total) by mouth 2 (two) times daily. 08/14/21  Yes BranchAlphonse Guild, MD  ?tamsulosin (FLOMAX) 0.4 MG CAPS capsule Take 0.4 mg by mouth 2 (two) times daily. 11/05/14  Yes [provider]  ?traMADol (ULTRAM) 50 MG tablet Take 1 tablet (50 mg total) by mouth every 6 (six) hours as needed for moderate pain. 09/05/21  Yes Daleen Bo, MD  ?vitamin B-12 (CYANOCOBALAMIN) 500 MCG tablet Take 500 mcg by mouth daily.   Yes [provider]  ?   ? ?Allergies    ?Patient has no known allergies.   ? ?Review of Systems   ?Review of Systems ? ?Physical Exam ?Updated Vital Signs ?BP (!) 144/63 (BP Location: Right Arm)   Pulse 83   Temp 98.3 ?F (36.8 ?C) (Oral)   Resp 17   Ht 5' 10"  (1.778 m)   Wt 90.7 kg   SpO2 97%   BMI 28.70 kg/m?  ?Physical Exam ?Vitals and nursing note reviewed.  ?Constitutional:   ?   Appearance: He is well-developed. He is not ill-appearing.  ?HENT:  ?  Head: Normocephalic and atraumatic.  ?   Right Ear: External ear normal.  ?   Left Ear: External ear normal.  ?Eyes:  ?   Conjunctiva/sclera: Conjunctivae normal.  ?   Pupils: Pupils are equal, round, and reactive to light.  ?Neck:  ?   Trachea: Phonation normal.  ?Cardiovascular:  ?   Rate and Rhythm: Normal rate.  ?Pulmonary:  ?   Effort: Pulmonary effort is normal.  ?Abdominal:  ?   General: There is no distension.  ?Musculoskeletal:     ?   General: Normal range of motion.  ?   Cervical back: Normal range of motion and neck supple.  ?   Comments: Right finger 3 with complete amputation just distal to DIP joint.  No visible nail in remaining tissue.  No active bleeding.  Bone exposed.  He is able to flex the DIP  joint of the affected finger.  ?Skin: ?   General: Skin is warm and dry.  ?Neurological:  ?   Mental Status: He is alert and oriented to person, place, and time.  ?   Cranial Nerves: No cranial nerve deficit.  ?   Motor: No abnormal muscle tone.  ?   Coordination: Coordination normal.  ?Psychiatric:     ?   Mood and Affect: Mood normal.     ?   Behavior: Behavior normal.     ?   Thought Content: Thought content normal.     ?   Judgment: Judgment normal.  ? ? ?ED Results / Procedures / Treatments   ?Labs ?(all labs ordered are listed, but only abnormal results are displayed) ?Labs Reviewed - No data to display ? ?EKG ?None ? ?Radiology ?DG Finger Middle Right ? ?Result Date: 09/05/2021 ?CLINICAL DATA:  Amputation of right middle finger while changing a tire on trailer. EXAM: RIGHT MIDDLE FINGER 2+V COMPARISON:  09/05/2021 FINDINGS: There has been posttraumatic osseous and soft tissue amputation of the mid shaft and tuft of the third distal phalanx. No additional fracture or dislocation identified. Degenerative changes are noted involving the third MCP joint. IMPRESSION: Posttraumatic osseous and soft tissue amputation of the mid shaft and tuft of the third distal phalanx. Electronically Signed   By: Kerby Moors M.D.   On: 09/05/2021 17:27   ? ?Procedures ?[facility] Injury Treatment ? ?Date/Time: 09/06/2021 12:50 PM ?Performed by: Daleen Bo, MD ?Authorized by: Daleen Bo, MD  ? ?Consent:  ?  Consent obtained:  Verbal ?  Alternatives discussed:  No treatmentInjury location: finger ?Location details: right long finger ?Injury type: fracture (Complete amputation through distal phalanx) ?Fracture type: distal phalanx ?MCP joint involved: no ?IP joint involved: no ?Anesthesia: digital block ? ?Anesthesia: ?Local anesthesia used: yes ?Local Anesthetic: bupivacaine 0.25% without epinephrine ?Anesthetic total: 6 mL ? ?Patient sedated: NoManipulation performed: no ?Immobilization: Xeroform dressing with gauze and  Coban. ?Splint Applied by: ED Provider ?Comments: Bleeding controlled, pressure bandage applied, wound protected with gauze and Coban ? ?  ? ? ?Medications Ordered in ED ?Medications  ?bupivacaine (PF) (MARCAINE) 0.25 % injection 10 mL (10 mLs Infiltration Given 09/05/21 1824)  ?cephALEXin (KEFLEX) capsule 500 mg (500 mg Oral Given 09/05/21 1948)  ? ? ?ED Course/ Medical Decision Making/ A&P ?  ?                        ?Medical Decision Making ? ? ?Patient presenting with fingertip injury, pinched between metal objects.  He has a complete fingertip amputation,  not actively bleeding on arrival ? ?Problems Addressed: ?Amputation of tip of finger, initial encounter: acute illness or injury ?   Details: Wound care required ? ?Amount and/or Complexity of Data Reviewed ?Independent Historian:  ?   Details: Cogent historian ?Radiology: ordered and independent interpretation performed. ?   Details: Right finger 3-amputation through distal phalanx, otherwise no injury ?Discussion of management or test interpretation with external provider(s): Case discussed with the on-call orthopedic/hand surgeon, Dr. Aline Brochure.  He recommends wound care then follow-up with him in the office.  At that time he will determine definitive care.  He recommends initiation of antibiotic treatment. ? ?Risk ?Prescription drug management. ?Decision regarding hospitalization. ?Risk Details: Patient's wound was treated, after digital block, it was scrubbed with Hibiclens, bleeding controlled with pressure then Xeroform, gauze, Coban applied.  Tolerated procedure well.  No bleeding through dressing.  Prescription sent for narcotic analgesia, and antibiotic.  First dose of antibiotic in the ED.  No requirement for hospitalization or immediate surgical intervention.  Discharged with daughter in stable condition ? ? ? ? ? ? ? ? ? ? ?Final Clinical Impression(s) / ED Diagnoses ?Final diagnoses:  ?Amputation of tip of finger, initial encounter  ? ? ?Rx / DC  Orders ?ED Discharge Orders   ? ?      Ordered  ?  traMADol (ULTRAM) 50 MG tablet  Every 6 hours PRN       ? 09/05/21 1912  ?  cephALEXin (KEFLEX) 500 MG capsule  4 times daily       ? 09/05/21 1912  ? ?  ?  ? ?  ?

## 2021-09-05 NOTE — ED Triage Notes (Signed)
Cut the tip of right middle finger off while changing a tire ?

## 2021-09-08 ENCOUNTER — Ambulatory Visit: Payer: Medicare HMO | Admitting: Orthopedic Surgery

## 2021-09-08 ENCOUNTER — Encounter: Payer: Self-pay | Admitting: Orthopedic Surgery

## 2021-09-08 VITALS — BP 168/68 | HR 72 | Ht 70.0 in | Wt 199.2 lb

## 2021-09-08 DIAGNOSIS — S68112A Complete traumatic metacarpophalangeal amputation of right middle finger, initial encounter: Secondary | ICD-10-CM

## 2021-09-08 DIAGNOSIS — S68119A Complete traumatic metacarpophalangeal amputation of unspecified finger, initial encounter: Secondary | ICD-10-CM

## 2021-09-08 NOTE — Patient Instructions (Signed)
? ? ? ? ? ? ? Waynetta Sandy ? 09/08/2021  ?  ? @PREFPERIOPPHARMACY @ ? ? Your procedure is scheduled on  09/12/2021. ? ? Report to [facility] at  1100  A.M. ? ? Call this number if you have problems the morning of surgery: ? [phone] ? ? Remember: ? Do not eat or drink after midnight. ?  ? ?  Take 1/2 of your night time insulin the night before your procedure.  ? ?  DO NO take any medications for diabetes the morning of your procedure. ?  ? Take these medicines the morning of surgery with A SIP OF WATER  ? ?           amlodipine, clonidine, flomax, ultram (if needed). ?  ? ? ? Do not wear jewelry, make-up or nail polish. ? Do not wear lotions, powders, or perfumes, or deodorant. ? Do not shave 48 hours prior to surgery.  Men may shave face and neck. ? Do not bring valuables to the hospital. ? [facility] is not responsible for any belongings or valuables. ? ?Contacts, dentures or bridgework may not be worn into surgery.  Leave your suitcase in the car.  After surgery it may be brought to your room. ? ?For patients admitted to the hospital, discharge time will be determined by your treatment team. ? ?Patients discharged the day of surgery will not be allowed to drive home and must have someone with them for 24 hours.  ? ? ?Special instructions:   DO NOT smoke tobacco or vape for 24 hours before your procedure. ? ?Please read over the following fact sheets that you were given. ?Coughing and Deep Breathing, Surgical Site Infection Prevention, Anesthesia Post-op Instructions, and Care and Recovery After Surgery ?  ? ? ? Incision Care, Adult ?An incision is a cut that a doctor makes in your skin for surgery. Most times, these cuts are closed after surgery. Your cut from surgery may be closed with: ?Stitches (sutures). ?Staples. ?Skin glue. ?Skin tape (adhesive) strips. ?You may need to go back to your doctor to have stitches or staples taken out. This may happen many days or many weeks after your surgery. You need to  take good care of your cut so it does not get infected. Follow instructions from your doctor about how to care for your cut. ?Supplies needed: ?Soap and water. ?A clean hand towel. ?Wound cleanser. ?A clean bandage (dressing), if needed. ?Cream or ointment, if told by your doctor. ?Clean gauze. ?How to care for your cut from surgery ?Cleaning your cut ?Ask your doctor how to clean your cut. You may need to: ?Wear medical gloves. ?Use mild soap and water, or a wound cleanser. ?Use a clean gauze to pat your cut dry after you clean it. ?Changing your bandage ?Wash your hands with soap and water for at least 20 seconds before and after you change your bandage. If you cannot use soap and water, use hand sanitizer. ?Do not usedisinfectants or antiseptics, such as rubbing alcohol, to clean your wound unless told by your doctor. ?Change your bandage as told by your doctor. ?Leavestitches or skin glue in place for at least 2 weeks. ?Leave tape strips alone unless you are told to take them off. You may trim the edges of the tape strips if they curl up. ?Put a cream or ointment on your cut. Do this only as told. ?Cover your cut with a clean bandage. ?Ask your doctor when you can  leave your cut uncovered. ?Checking for infection ?Check your cut area every day for signs of infection. Check for: ?More redness, swelling, or pain. ?More fluid or blood. ?New warmth. ?Hardness or a new rash around the incision. ?Pus or a bad smell. ? ?Follow these instructions at home ?Medicines ?Take over-the-counter and prescription medicines only as told by your doctor. ?If you were prescribed an antibiotic medicine, cream, or ointment, use it as told by your doctor. Do not stop using the antibiotic even if you start to feel better. ?Eating and drinking ?Eat foods that have a lot of certain nutrients, such as protein, vitamin A, and vitamin C. These foods help your cut heal. ?Foods rich in protein include meat, fish, eggs, dairy, beans, nuts, and  protein drinks. ?Foods rich in vitamin A include carrots and dark green, leafy vegetables. ?Foods rich in vitamin C include citrus fruits, tomatoes, broccoli, and peppers. ?Drink enough fluid to keep your pee (urine) pale yellow. ?General instructions ? ?Do not take baths, swim, or use a hot tub. Ask your doctor about taking showers or sponge baths. ?Limit movement around your cut. This helps with healing. ?Try not to strain, lift, or exercise for the first 2 weeks, or for as long as told by your doctor. ?Return to your normal activities as told by your doctor. Ask your doctor what activities are safe for you. ?Do not scratch, scrub, or pick at your cut. Keep it covered as told by your doctor. ?Protect your cut from the sun when you are outside for the first 6 months, or for as long as told by your doctor. Cover up the scar area or put on sunscreen that has an SPF of at least 30. ?Do not use any products that contain nicotine or tobacco, such as cigarettes, e-cigarettes, and chewing tobacco. These can delay cut healing. If you need help quitting, ask your doctor. ?Keep all follow-up visits. ?Contact a doctor if: ?You have any of these signs of infection around your cut: ?More redness, swelling, or pain. ?More fluid or blood. ?New warmth or hardness. ?Pus or a bad smell. ?A new rash. ?You have a fever. ?You feel like you may vomit (nauseous). ?You vomit. ?You are dizzy. ?Your stitches, staples, skin glue, or tape strips come undone. ?Your cut gets bigger. ?You have a fever. ?Get help right away if: ?Your cut bleeds through your bandage, and bleeding does not stop with gentle pressure. ?Your cut opens up and comes apart. ?These symptoms may be an emergency. Do not wait to see if the symptoms will go away. Get medical help right away. Call your local emergency services (911 in the [place] Do not drive yourself to the hospital. ?Summary ?Follow instructions from your doctor about how to care for your cut. ?Wash your  hands with soap and water for at least 20 seconds before and after you change your bandage. If you cannot use soap and water, use hand sanitizer. ?Check your cut area every day for signs of infection. ?Keep all follow-up visits. ?This information is not intended to replace advice given to you by your health care provider. Make sure you discuss any questions you have with your health care provider. ?Document Revised: 08/12/2020 Document Reviewed: 08/12/2020 ?Elsevier Patient Education ? [place]. ? ? ? ?Traumatic Finger Amputation ?A traumatic finger amputation is when a person loses part or all of a finger because of an accident or injury. This condition is a medical emergency. It needs to be treated  right away to prevent more damage to the finger and to reattach the lost part of the finger if that is possible. ?Amputations of the finger can be: ?Partial. This means that some structures remain attached. ?Complete. The entire finger is removed. ?What are the causes? ?This condition usually results from an accident that involves: ?A car. ?Power tools. ?Factory work. ?Airline pilot. ?A sharp cut. ?What are the signs or symptoms? ?Symptoms of this condition include: ?Bleeding. ?Pain. ?Damage to surrounding tissues, such as bones, muscles, tendons, and skin. ?Damage or removal of the nail bed. ?How is this diagnosed? ?This condition is diagnosed with a physical exam. During the exam, your health care provider will determine how severe the injury is and the best way to treat it.  ?X-rays may be done to check for damage to the surrounding bones and tissues. ?How is this treated? ?This condition is treated by cleaning the wound thoroughly and taking medicines for pain. Additional treatment depends on the type of injury that you have and how severe it is: ?If only the tip of your finger was removed, treatment may involve placing a protective bandage (dressing) over the wound and cleaning the wound  regularly. ?If the injury is severe, a portion of skin may be taken from another part of the body (graft) and attached to the wound site until the wound heals. ?If a large portion of the finger was cut off, treatme

## 2021-09-08 NOTE — Addendum Note (Signed)
Addended by: Brand Males E on: 09/08/2021 03:22 PM ? ? Modules accepted: Orders ? ?

## 2021-09-08 NOTE — Patient Instructions (Signed)
Surgery scheduled for Friday ? ?Revision of right long finger or middle fingertip amputation ?

## 2021-09-08 NOTE — Addendum Note (Signed)
Addended by: Obie Dredge A on: 09/08/2021 04:17 PM ? ? Modules accepted: Orders ? ?

## 2021-09-08 NOTE — Progress Notes (Signed)
NEW PROBLEM//OFFICE VISIT ? ? ?Chief Complaint  ?Patient presents with  ? right middle finger amputation  ?  DOI 09/05/2021.  Mashed end of finger between fender and tire on trailer. Was seen at [facility] ED. Has been given an antibiotic which he started Friday evening. Is not taking anything for pain. Denies pain. Right hand dominate.  ? ?88 year old male was working on a tire he amputated the tip of his right long or middle finger he was seen in the emergency room and it was cleaned and treated and dressed he is here for evaluation and management ? ? ? ?No major related findings on review of systems ? ?BP (!) 168/68   Pulse 72   Ht 5' 10"  (1.778 m)   Wt 199 lb 3.2 oz (90.4 kg)   BMI 28.58 kg/m?  ? ?Body mass index is 28.58 kg/m?. ? ?General appearance: Well-developed well-nourished no gross deformities ? ?Cardiovascular normal pulse and perfusion normal color without edema ? ?Neurologically no sensation loss or deficits or pathologic reflexes ? ?Psychological: Awake alert and oriented x3 mood and affect normal ? ?Skin no lacerations or ulcerations no nodularity no palpable masses, no erythema or nodularity ? ?Musculoskeletal: His fingertip amputation is for the most part transverse.  He is very tender of course there is not a lot of swelling although the dressing was very tight there is a clean wound bed ? ?The x-ray shows prominent bone which will have to be resected ? ? ? ? ? ?Past Medical History:  ?Diagnosis Date  ? BPH (benign prostatic hypertrophy)   ? Cancer [facility])   ? skin cancer - basal  ? Carotid artery occlusion   ? Diabetes ([place])   ? Type II  ? High cholesterol   ? Hypertension   ? Myocardial infarction [facility])   ? Peripheral vascular disease ([place])   ? Stroke [facility])   ?   documented 12/28/2019- ? TIA  " weeks 3 weeks ago, expresive aphasia lasted 2 -3 minutes."   ? ? ?Past Surgical History:  ?Procedure Laterality Date  ? CARDIAC CATHETERIZATION    ? CHOLECYSTECTOMY  1973  ? COLONOSCOPY  07/20/2006  ? Dr.  [name]:Status post right hemicolectomy. Residual colonic mucosa appeared normal, normal rectum.   ? COLONOSCOPY  2006/2007  ? sprawling villous adenoma at ileocecal valve  ? COLONOSCOPY  2011  ? Dr. [name]: normal rectum, pancolonic diverticulosis, 2 diminutive polyps, with path benign polypoid colonic mucosa  ? COLONOSCOPY N/A 01/02/2015  ? Procedure: COLONOSCOPY;  Surgeon: Daneil Dolin, MD;  Location[facility];  Service: Endoscopy;  Laterality: N/A;  1245  ? CORONARY ANGIOPLASTY    ? ENDARTERECTOMY Right 12/29/2019  ? Procedure: ENDARTERECTOMY CAROTID;  Surgeon: [name], MD;  Location: [facility] OR;  Service: Vascular;  Laterality: Right;  ? ESOPHAGOGASTRODUODENOSCOPY (EGD) WITH PROPOFOL N/A 01/13/2018  ? Procedure: ESOPHAGOGASTRODUODENOSCOPY (EGD) WITH PROPOFOL;  Surgeon: [name], MD;  Location: [facility] ENDOSCOPY;  Service: Endoscopy;  Laterality: N/A;  ? EUS N/A 01/13/2018  ? Procedure: UPPER ENDOSCOPIC ULTRASOUND (EUS) RADIAL;  Surgeon: [name], MD;  Location: [facility] ENDOSCOPY;  Service: Endoscopy;  Laterality: N/A;  ? FINE NEEDLE ASPIRATION N/A 01/13/2018  ? Procedure: FINE NEEDLE ASPIRATION (FNA) LINEAR;  Surgeon: [name], MD;  Location: [facility] ENDOSCOPY;  Service: Endoscopy;  Laterality: N/A;  ? open hemicolectomy  2007  ? due to villous adenoma  ? PATCH ANGIOPLASTY Right 12/29/2019  ? Procedure: RIGHT CAROTID PATCH ANGIOPLASTY USING HEMASHIELD  PLATINUM FINESSE;  Surgeon: [name], MD;  Location: [facility] OR;  Service: Vascular;  Laterality: Right;  ? ? ?Family History  ?Problem Relation Age of Onset  ? Ulcers Father   ? Diabetes type II Brother   ? Diabetes type II Brother   ? Colon cancer Neg Hx   ? ?Social History  ? ?Tobacco Use  ? Smoking status: Former  ?  Types: Cigars  ? Smokeless tobacco: Never  ?Vaping Use  ? Vaping Use: Never used  ?Substance Use Topics  ? Alcohol use: No  ?  Alcohol/week: 0.0 standard drinks  ? Drug use: No  ? ? ?No Known Allergies ? ?Current Meds  ?Medication Sig  ?  amLODipine (NORVASC) 10 MG tablet Take 1 tablet (10 mg total) by mouth daily.  ? aspirin 81 MG tablet Take 81 mg by mouth at bedtime.   ? cephALEXin (KEFLEX) 500 MG capsule Take 1 capsule (500 mg total) by mouth 4 (four) times daily.  ? Cholecalciferol (VITAMIN D3) 10 MCG (400 UNIT) CAPS Take 2 capsules by mouth daily.  ? cloNIDine (CATAPRES) 0.1 MG tablet Take 1 tablet (0.1 mg total) by mouth 2 (two) times daily.  ? diphenhydramine-acetaminophen (TYLENOL PM) 25-500 MG TABS tablet Take 1 tablet by mouth at bedtime as needed (sleep).  ? hydrALAZINE (APRESOLINE) 100 MG tablet Take 1 tablet (100 mg total) by mouth 3 (three) times daily. Dose change.  ? insulin aspart (NOVOLOG) 100 UNIT/ML FlexPen Inject 15 Units into the skin daily as needed for high blood sugar.  ? LANTUS SOLOSTAR 100 UNIT/ML Solostar Pen Inject 30 Units into the skin 2 (two) times daily.  ? magnesium gluconate (MAGONATE) 500 MG tablet Take 500 mg by mouth daily.  ? metFORMIN (GLUCOPHAGE) 500 MG tablet Take 500 mg by mouth in the morning and at bedtime.  ? olmesartan (BENICAR) 40 MG tablet Take 40 mg by mouth daily.  ? rosuvastatin (CRESTOR) 10 MG tablet Take 10 mg by mouth daily.  ? spironolactone (ALDACTONE) 25 MG tablet Take 0.5 tablets (12.5 mg total) by mouth 2 (two) times daily.  ? tamsulosin (FLOMAX) 0.4 MG CAPS capsule Take 0.4 mg by mouth 2 (two) times daily.  ? vitamin B-12 (CYANOCOBALAMIN) 500 MCG tablet Take 500 mcg by mouth daily.  ? ? ? ?MEDICAL DECISION MAKING ? ?A.  ?Encounter Diagnosis  ?Name Primary?  ? Amputation of tip of finger, initial encounter, right middle finger, right long finger Yes  ? ? ?B. DATA ANALYSED: ? ? ?IMAGING: ?Interpretation of images: I have personally reviewed the images and my interpretation is images show partial amputation including bone right long finger ?Orders: Surgical revision amputation right long finger ? ?Outside records reviewed: Emergency room records ? ? ?C. MANAGEMENT  ? ?Recommend shortening of  the bone and then wound healing by secondary intention ? ?Plan for revision amputation right long finger on Friday ? ?No orders of the defined types were placed in this encounter. ? ? ? ?Arther Abbott, MD ? ?09/08/2021 ?1:04 PM ?

## 2021-09-09 ENCOUNTER — Telehealth: Payer: Self-pay | Admitting: Cardiology

## 2021-09-09 MED ORDER — SPIRONOLACTONE 25 MG PO TABS: 12.5000 mg | ORAL_TABLET | Freq: Two times a day (BID) | ORAL | 6 refills | Status: DC

## 2021-09-09 NOTE — Telephone Encounter (Signed)
Completed.

## 2021-09-09 NOTE — Telephone Encounter (Signed)
? ?*  STAT* If patient is at the pharmacy, call can be transferred to refill team. ? ? ?1. Which medications need to be refilled? (please list name of each medication and dose if known) spironolactone (ALDACTONE) 25 MG tablet ? ?2. Which pharmacy/location (including street and city if local pharmacy) is medication to be sent to? [place], [place] ? ?3. Do they need a 30 day or 90 day supply? 90 days ? ?Pt said, pharmacy told him they did not receive new prescription sent on 03/23. Please resend prescription  ?

## 2021-09-10 ENCOUNTER — Encounter (HOSPITAL_COMMUNITY)
Admission: RE | Admit: 2021-09-10 | Discharge: 2021-09-10 | Disposition: A | Discharge status: Home / Self Care | Payer: Medicare HMO | Source: Ambulatory Visit | Attending: Orthopedic Surgery | Admitting: Orthopedic Surgery

## 2021-09-10 ENCOUNTER — Ambulatory Visit: Payer: Medicare HMO | Admitting: Orthopedic Surgery

## 2021-09-10 VITALS — BP 161/58 | HR 69 | Temp 97.5°F | Resp 18 | Ht 70.0 in | Wt 199.0 lb

## 2021-09-10 DIAGNOSIS — S68119A Complete traumatic metacarpophalangeal amputation of unspecified finger, initial encounter: Secondary | ICD-10-CM | POA: Insufficient documentation

## 2021-09-10 DIAGNOSIS — Z01812 Encounter for preprocedural laboratory examination: Secondary | ICD-10-CM | POA: Diagnosis not present

## 2021-09-10 DIAGNOSIS — E119 Type 2 diabetes mellitus without complications: Secondary | ICD-10-CM

## 2021-09-10 DIAGNOSIS — X58XXXA Exposure to other specified factors, initial encounter: Secondary | ICD-10-CM | POA: Insufficient documentation

## 2021-09-10 LAB — CBC
HCT: 35.6 % — ABNORMAL LOW (ref 39.0–52.0)
Hemoglobin: 11.7 g/dL — ABNORMAL LOW (ref 13.0–17.0)
MCH: 29.9 pg (ref 26.0–34.0)
MCHC: 32.9 g/dL (ref 30.0–36.0)
MCV: 91 fL (ref 80.0–100.0)
Platelets: 165 10*3/uL (ref 150–400)
RBC: 3.91 MIL/uL — ABNORMAL LOW (ref 4.22–5.81)
RDW: 12.2 % (ref 11.5–15.5)
WBC: 8.7 10*3/uL (ref 4.0–10.5)
nRBC: 0 % (ref 0.0–0.2)

## 2021-09-10 LAB — BASIC METABOLIC PANEL
Anion gap: 9 (ref 5–15)
BUN: 59 mg/dL — ABNORMAL HIGH (ref 8–23)
CO2: 17 mmol/L — ABNORMAL LOW (ref 22–32)
Calcium: 9.2 mg/dL (ref 8.9–10.3)
Chloride: 108 mmol/L (ref 98–111)
Creatinine, Ser: 2.16 mg/dL — ABNORMAL HIGH (ref 0.61–1.24)
GFR, Estimated: 29 mL/min — ABNORMAL LOW (ref >60)
Glucose, Bld: 184 mg/dL — ABNORMAL HIGH (ref 70–99)
Potassium: 5.5 mmol/L — ABNORMAL HIGH (ref 3.5–5.1)
Sodium: 134 mmol/L — ABNORMAL LOW (ref 135–145)

## 2021-09-10 LAB — HEMOGLOBIN A1C
Hgb A1c MFr Bld: 7.8 % — ABNORMAL HIGH (ref 4.8–5.6)
Mean Plasma Glucose: 177.16 mg/dL

## 2021-09-11 NOTE — Progress Notes (Signed)
Dr Aline Brochure and Dr Briant Cedar notified of K+ 5.3 and BUN 59,Creat 2.16.  Dr Briant Cedar reviewed, No orders given. ?

## 2021-09-12 ENCOUNTER — Ambulatory Visit (HOSPITAL_COMMUNITY): Payer: Medicare HMO

## 2021-09-12 ENCOUNTER — Ambulatory Visit (HOSPITAL_COMMUNITY)
Admission: RE | Admit: 2021-09-12 | Discharge: 2021-09-12 | Disposition: A | Discharge status: Home / Self Care | Payer: Medicare HMO | Attending: Orthopedic Surgery | Admitting: Orthopedic Surgery

## 2021-09-12 ENCOUNTER — Ambulatory Visit (HOSPITAL_COMMUNITY): Payer: Medicare HMO | Admitting: Anesthesiology

## 2021-09-12 ENCOUNTER — Ambulatory Visit (HOSPITAL_BASED_OUTPATIENT_CLINIC_OR_DEPARTMENT_OTHER): Payer: Medicare HMO | Admitting: Anesthesiology

## 2021-09-12 ENCOUNTER — Other Ambulatory Visit: Payer: Self-pay

## 2021-09-12 ENCOUNTER — Encounter (HOSPITAL_COMMUNITY)
Admission: RE | Disposition: A | Discharge status: Home / Self Care | Payer: Self-pay | Source: Home / Self Care | Attending: Orthopedic Surgery

## 2021-09-12 DIAGNOSIS — E1151 Type 2 diabetes mellitus with diabetic peripheral angiopathy without gangrene: Secondary | ICD-10-CM | POA: Diagnosis not present

## 2021-09-12 DIAGNOSIS — Z794 Long term (current) use of insulin: Secondary | ICD-10-CM

## 2021-09-12 DIAGNOSIS — W230XXA Caught, crushed, jammed, or pinched between moving objects, initial encounter: Secondary | ICD-10-CM | POA: Diagnosis not present

## 2021-09-12 DIAGNOSIS — S68119A Complete traumatic metacarpophalangeal amputation of unspecified finger, initial encounter: Secondary | ICD-10-CM

## 2021-09-12 DIAGNOSIS — S68622A Partial traumatic transphalangeal amputation of right middle finger, initial encounter: Secondary | ICD-10-CM | POA: Diagnosis not present

## 2021-09-12 DIAGNOSIS — Z7984 Long term (current) use of oral hypoglycemic drugs: Secondary | ICD-10-CM | POA: Diagnosis not present

## 2021-09-12 DIAGNOSIS — S68112D Complete traumatic metacarpophalangeal amputation of right middle finger, subsequent encounter: Secondary | ICD-10-CM | POA: Diagnosis not present

## 2021-09-12 DIAGNOSIS — I129 Hypertensive chronic kidney disease with stage 1 through stage 4 chronic kidney disease, or unspecified chronic kidney disease: Secondary | ICD-10-CM

## 2021-09-12 DIAGNOSIS — I252 Old myocardial infarction: Secondary | ICD-10-CM

## 2021-09-12 DIAGNOSIS — S68112A Complete traumatic metacarpophalangeal amputation of right middle finger, initial encounter: Secondary | ICD-10-CM

## 2021-09-12 DIAGNOSIS — N189 Chronic kidney disease, unspecified: Secondary | ICD-10-CM

## 2021-09-12 DIAGNOSIS — Z87891 Personal history of nicotine dependence: Secondary | ICD-10-CM

## 2021-09-12 DIAGNOSIS — E1122 Type 2 diabetes mellitus with diabetic chronic kidney disease: Secondary | ICD-10-CM

## 2021-09-12 DIAGNOSIS — Z8673 Personal history of transient ischemic attack (TIA), and cerebral infarction without residual deficits: Secondary | ICD-10-CM | POA: Insufficient documentation

## 2021-09-12 DIAGNOSIS — S68612A Complete traumatic transphalangeal amputation of right middle finger, initial encounter: Secondary | ICD-10-CM | POA: Diagnosis not present

## 2021-09-12 DIAGNOSIS — E119 Type 2 diabetes mellitus without complications: Secondary | ICD-10-CM

## 2021-09-12 DIAGNOSIS — I10 Essential (primary) hypertension: Secondary | ICD-10-CM | POA: Insufficient documentation

## 2021-09-12 DIAGNOSIS — N183 Chronic kidney disease, stage 3 unspecified: Secondary | ICD-10-CM | POA: Diagnosis not present

## 2021-09-12 HISTORY — PX: AMPUTATION: SHX166

## 2021-09-12 LAB — GLUCOSE, CAPILLARY
Glucose-Capillary: 150 mg/dL — ABNORMAL HIGH (ref 70–99)
Glucose-Capillary: 160 mg/dL — ABNORMAL HIGH (ref 70–99)

## 2021-09-12 SURGERY — AMPUTATION DIGIT
Anesthesia: General | Site: Middle Finger | Laterality: Right

## 2021-09-12 MED ORDER — FENTANYL CITRATE (PF) 100 MCG/2ML IJ SOLN
INTRAMUSCULAR | Status: DC | PRN
  Administered 2021-09-12 (×4): 25 ug via INTRAVENOUS

## 2021-09-12 MED ORDER — ONDANSETRON HCL 4 MG/2ML IJ SOLN: 4.0000 mg | Freq: Once | INTRAMUSCULAR | Status: DC | PRN

## 2021-09-12 MED ORDER — LIDOCAINE HCL (PF) 1 % IJ SOLN
INTRAMUSCULAR | Status: DC | PRN
  Administered 2021-09-12: 10 mL

## 2021-09-12 MED ORDER — LACTATED RINGERS IV SOLN: INTRAVENOUS | Status: DC

## 2021-09-12 MED ORDER — LIDOCAINE 2% (20 MG/ML) 5 ML SYRINGE
INTRAMUSCULAR | Status: DC | PRN
  Administered 2021-09-12: 80 mg via INTRAVENOUS

## 2021-09-12 MED ORDER — CEFAZOLIN SODIUM-DEXTROSE 2-4 GM/100ML-% IV SOLN
2.0000 g | INTRAVENOUS | Status: AC
  Administered 2021-09-12: 2 g via INTRAVENOUS

## 2021-09-12 MED ORDER — ONDANSETRON HCL 4 MG/2ML IJ SOLN
INTRAMUSCULAR | Status: AC
  Filled 2021-09-12: qty 2

## 2021-09-12 MED ORDER — SODIUM CHLORIDE 0.9 % IR SOLN
Status: DC | PRN
  Administered 2021-09-12: 1000 mL

## 2021-09-12 MED ORDER — CHLORHEXIDINE GLUCONATE 0.12 % MT SOLN
15.0000 mL | Freq: Once | OROMUCOSAL | Status: AC
  Administered 2021-09-12: 15 mL via OROMUCOSAL

## 2021-09-12 MED ORDER — ORAL CARE MOUTH RINSE: 15.0000 mL | Freq: Once | OROMUCOSAL | Status: AC

## 2021-09-12 MED ORDER — PROPOFOL 10 MG/ML IV BOLUS
INTRAVENOUS | Status: AC
  Filled 2021-09-12: qty 20

## 2021-09-12 MED ORDER — ONDANSETRON HCL 4 MG/2ML IJ SOLN
INTRAMUSCULAR | Status: DC | PRN
  Administered 2021-09-12: 4 mg via INTRAVENOUS

## 2021-09-12 MED ORDER — LIDOCAINE HCL (PF) 1 % IJ SOLN
INTRAMUSCULAR | Status: AC
  Filled 2021-09-12: qty 30

## 2021-09-12 MED ORDER — FENTANYL CITRATE PF 50 MCG/ML IJ SOSY: 25.0000 ug | PREFILLED_SYRINGE | INTRAMUSCULAR | Status: DC | PRN

## 2021-09-12 MED ORDER — FENTANYL CITRATE (PF) 100 MCG/2ML IJ SOLN
INTRAMUSCULAR | Status: AC
  Filled 2021-09-12: qty 2

## 2021-09-12 MED ORDER — CEFAZOLIN SODIUM-DEXTROSE 2-4 GM/100ML-% IV SOLN
INTRAVENOUS | Status: AC
  Filled 2021-09-12: qty 100

## 2021-09-12 MED ORDER — PROPOFOL 10 MG/ML IV BOLUS
INTRAVENOUS | Status: DC | PRN
  Administered 2021-09-12: 120 mg via INTRAVENOUS

## 2021-09-12 MED ORDER — HYDROCODONE-ACETAMINOPHEN 10-325 MG PO TABS
1.0000 | ORAL_TABLET | Freq: Four times a day (QID) | ORAL | 0 refills | Status: AC | PRN
Start: 2021-09-12 — End: 2021-09-15

## 2021-09-12 MED ORDER — LIDOCAINE HCL (PF) 2 % IJ SOLN
INTRAMUSCULAR | Status: AC
  Filled 2021-09-12: qty 5

## 2021-09-12 SURGICAL SUPPLY — 40 items
APL PRP STRL LF DISP 70% ISPRP (MISCELLANEOUS) ×1
BANDAGE ESMARK 4X12 BL STRL LF (DISPOSABLE) ×2 IMPLANT
BLADE OSC/SAG 11.5X5.5X.38 (BLADE) ×1 IMPLANT
BLADE SURG 15 STRL LF DISP TIS (BLADE) IMPLANT
BLADE SURG 15 STRL SS (BLADE) ×2
BNDG CMPR 12X4 ELC STRL LF (DISPOSABLE) ×1
BNDG CONFORM 2 STRL LF (GAUZE/BANDAGES/DRESSINGS) ×3 IMPLANT
BNDG ESMARK 4X12 BLUE STRL LF (DISPOSABLE) ×2
CHLORAPREP W/TINT 26 (MISCELLANEOUS) ×3 IMPLANT
CLOTH BEACON ORANGE TIMEOUT ST (SAFETY) ×3 IMPLANT
COVER LIGHT HANDLE STERIS (MISCELLANEOUS) ×6 IMPLANT
CUFF TOURN SGL QUICK 18X4 (TOURNIQUET CUFF) ×3 IMPLANT
DRAPE C-ARM FOLDED MOBILE STRL (DRAPES) ×1 IMPLANT
DRSG TUBE GAUZE 1X5YD SZ2 (GAUZE/BANDAGES/DRESSINGS) ×1 IMPLANT
ELECT REM PT RETURN 9FT ADLT (ELECTROSURGICAL) ×2
ELECTRODE REM PT RTRN 9FT ADLT (ELECTROSURGICAL) ×2 IMPLANT
GAUZE SPONGE 4X4 12PLY STRL (GAUZE/BANDAGES/DRESSINGS) ×1 IMPLANT
GAUZE XEROFORM 1X8 LF (GAUZE/BANDAGES/DRESSINGS) ×1 IMPLANT
GLOVE BIOGEL PI IND STRL 7.0 (GLOVE) ×4 IMPLANT
GLOVE BIOGEL PI IND STRL 8.5 (GLOVE) IMPLANT
GLOVE BIOGEL PI INDICATOR 7.0 (GLOVE) ×3
GLOVE BIOGEL PI INDICATOR 8.5 (GLOVE) ×1
GLOVE SURG SS PI 6.5 STRL IVOR (GLOVE) ×1 IMPLANT
GLOVE SURG SS PI 8.0 STRL IVOR (GLOVE) ×1 IMPLANT
GOWN STRL REUS W/TWL LRG LVL3 (GOWN DISPOSABLE) ×4 IMPLANT
GOWN STRL REUS W/TWL XL LVL3 (GOWN DISPOSABLE) ×3 IMPLANT
KIT TURNOVER KIT A (KITS) ×3 IMPLANT
MANIFOLD NEPTUNE II (INSTRUMENTS) ×3 IMPLANT
NDL HYPO 21X1.5 SAFETY (NEEDLE) IMPLANT
NEEDLE HYPO 21X1.5 SAFETY (NEEDLE) ×2 IMPLANT
NS IRRIG 1000ML POUR BTL (IV SOLUTION) ×3 IMPLANT
PACK BASIC LIMB (CUSTOM PROCEDURE TRAY) ×3 IMPLANT
PAD ARMBOARD 7.5X6 YLW CONV (MISCELLANEOUS) ×3 IMPLANT
SET BASIN LINEN APH (SET/KITS/TRAYS/PACK) ×3 IMPLANT
SOL PREP PROV IODINE SCRUB 4OZ (MISCELLANEOUS) ×3 IMPLANT
SPONGE GAUZE 2X2 8PLY STRL LF (GAUZE/BANDAGES/DRESSINGS) ×5 IMPLANT
SPONGE T-LAP 18X18 ~~LOC~~+RFID (SPONGE) ×3 IMPLANT
SUT ETHILON 3 0 FSL (SUTURE) ×3 IMPLANT
SYR BULB IRRIG 60ML STRL (SYRINGE) ×3 IMPLANT
SYR CONTROL 10ML LL (SYRINGE) ×1 IMPLANT

## 2021-09-12 NOTE — Brief Op Note (Signed)
09/12/2021 ? ?1:13 PM ? ?PATIENT:  Jonathan Avery  88 y.o. male ? ?PRE-OPERATIVE DIAGNOSIS:  traumatic right middle finger tip amputation ? ?POST-OPERATIVE DIAGNOSIS:  traumatic right middle finger tip amputation ? ?PROCEDURE:  Procedure(s): ?REVISION AMPUTATION DIGIT (Right) MIDDLE/LONG FINGER  ? ?Findings transverse amputation right middle or long finger with protruding bone ? ?SURGEON:  Surgeon(s) and Role: ?   Carole Civil, MD - Primary ? ?PHYSICIAN ASSISTANT:  ? ?ASSISTANTS: None ? ?ANESTHESIA:   LMA general ? ?EBL: None ? ?BLOOD ADMINISTERED: None ? ?DRAINS: None ? ?LOCAL MEDICATIONS USED: 1% lidocaine plain digital block ? ?SPECIMEN: None ? ?DISPOSITION OF SPECIMEN: None ? ?COUNTS: Correct ? ?TOURNIQUET:   ?Total Tourniquet Time Documented: ?Upper Arm (Right) - 20 minutes ?Total: Upper Arm (Right) - 20 minutes ? ? ?DICTATION: .Dragon Dictation ? ?PLAN OF CARE: Discharge to home after PACU ? ?PATIENT DISPOSITION:  PACU - hemodynamically stable. ?  ?Delay start of Pharmacological VTE agent (>24hrs) due to surgical blood loss or risk of bleeding: not applicable ? ? ?This procedure was done as follows ? ?[name] was seen in preop and cleared for surgery by me.  The surgical site was confirmed as the right long or middle finger and marked.  He was taken to the operating for general LMA anesthesia ? ?He was supine with an arm table for his hand ? ?Prep was done with Betadine, sterile dressing was done ? ?After timeout we exsanguinated the limb 4 inch Esmarch tourniquet elevated 250 mmHg.  I examined the finger there was bone protruding and it was palpable I retracted the skin edges with 2 skin hooks after freeing the skin with a Valora Corporal ? ?I used a sagittal saw to remove the bone and then trimmed it further with the rongeurs I took an x-ray the bone appeared to be satisfactorily removed to a more proximal position.  I took irrigation and irrigated the distal amputation stump and then closed with two  3-0 nylon sutures ? ?I placed Xeroform moist gauze finger dressing released the tourniquet applied pressure there was no bleeding he was extubated and taken to recovery in stable condition ? ? ?Plan for postop: ? ?Keep the hand elevated there may be some bleeding that leaks through that is okay ? ?Change the dressing in several days ? ?Start dressing changes in several days ? ? ? ?

## 2021-09-12 NOTE — Transfer of Care (Signed)
Immediate Anesthesia Transfer of Care Note ? ?Patient: Jonathan Avery ? ?Procedure(s) Performed: AMPUTATION DIGIT (Right: Middle Finger) ? ?Patient Location: PACU ? ?Anesthesia Type:General ? ?Level of Consciousness: awake, alert , oriented and patient cooperative ? ?Airway & Oxygen Therapy: Patient Spontanous Breathing and Patient connected to nasal cannula oxygen ? ?Post-op Assessment: Report given to RN, Post -op Vital signs reviewed and stable and Patient moving all extremities ? ?Post vital signs: Reviewed and stable ? ?Last Vitals:  ?Vitals Value Taken Time  ?BP 153/62 09/12/21 1315  ?Temp    ?Pulse 61 09/12/21 1318  ?Resp 12 09/12/21 1318  ?SpO2 96 % 09/12/21 1318  ?Vitals shown include unvalidated device data. ? ?Last Pain:  ?Vitals:  ? 09/12/21 1112  ?PainSc: 0-No pain  ?   ? ?  ? ?Complications: No notable events documented. ?

## 2021-09-12 NOTE — Anesthesia Procedure Notes (Signed)
Procedure Name: LMA Insertion ?Date/Time: 09/12/2021 12:33 PM ?Performed by: Myna Bright, CRNA ?Pre-anesthesia Checklist: Patient identified, Emergency Drugs available, Suction available and Patient being monitored ?Patient Re-evaluated:Patient Re-evaluated prior to induction ?Oxygen Delivery Method: Circle system utilized ?Preoxygenation: Pre-oxygenation with 100% oxygen ?Induction Type: IV induction ?Ventilation: Mask ventilation without difficulty ?LMA: LMA inserted ?LMA Size: 4.0 ?Tube type: Oral ?Number of attempts: 1 ?Placement Confirmation: positive ETCO2 and breath sounds checked- equal and bilateral ?Tube secured with: Tape ?Dental Injury: Teeth and Oropharynx as per pre-operative assessment  ? ? ? ? ?

## 2021-09-12 NOTE — Anesthesia Preprocedure Evaluation (Signed)
Anesthesia Evaluation  ?Patient identified by MRN, date of birth, ID band ?Patient awake ? ? ? ?Reviewed: ?Allergy & Precautions, H&P , NPO status , Patient's Chart, lab work & pertinent test results, reviewed documented beta blocker date and time  ? ?Airway ?Mallampati: II ? ?TM Distance: >3 FB ?Neck ROM: full ? ? ? Dental ?no notable dental hx. ? ?  ?Pulmonary ?neg pulmonary ROS, former smoker,  ?  ?Pulmonary exam normal ?breath sounds clear to auscultation ? ? ? ? ? ? Cardiovascular ?Exercise Tolerance: Good ?hypertension, + Past MI  ? ?Rhythm:regular Rate:Normal ? ? ?  ?Neuro/Psych ?TIAnegative psych ROS  ? GI/Hepatic ?negative GI ROS, Neg liver ROS,   ?Endo/Other  ?negative endocrine ROSdiabetes, Type 2 ? Renal/GU ?CRFRenal disease  ?negative genitourinary ?  ?Musculoskeletal ? ? Abdominal ?  ?Peds ? Hematology ?negative hematology ROS ?(+)   ?Anesthesia Other Findings ? ? Reproductive/Obstetrics ?negative OB ROS ? ?  ? ? ? ? ? ? ? ? ? ? ? ? ? ?  ?  ? ? ? ? ? ? ? ? ?Anesthesia Physical ?Anesthesia Plan ? ?ASA: 3 ? ?Anesthesia Plan: General and General LMA  ? ?Post-op Pain Management:   ? ?Induction:  ? ?PONV Risk Score and Plan: Ondansetron ? ?Airway Management Planned:  ? ?Additional Equipment:  ? ?Intra-op Plan:  ? ?Post-operative Plan:  ? ?Informed Consent: I have reviewed the patients History and Physical, chart, labs and discussed the procedure including the risks, benefits and alternatives for the proposed anesthesia with the patient or authorized representative who has indicated his/her understanding and acceptance.  ? ? ? ?Dental Advisory Given ? ?Plan Discussed with: CRNA ? ?Anesthesia Plan Comments:   ? ? ? ? ? ? ?Anesthesia Quick Evaluation ? ?

## 2021-09-12 NOTE — Anesthesia Postprocedure Evaluation (Signed)
Anesthesia Post Note ? ?Patient: Jonathan Avery ? ?Procedure(s) Performed: AMPUTATION DIGIT (Right: Middle Finger) ? ?Patient location during evaluation: Phase II ?Anesthesia Type: General ?Level of consciousness: awake ?Pain management: pain level controlled ?Vital Signs Assessment: post-procedure vital signs reviewed and stable ?Respiratory status: spontaneous breathing and respiratory function stable ?Cardiovascular status: blood pressure returned to baseline and stable ?Postop Assessment: no headache and no apparent nausea or vomiting ?Anesthetic complications: no ?Comments: Late entry ? ? ?No notable events documented. ? ? ?Last Vitals:  ?Vitals:  ? 09/12/21 1400 09/12/21 1406  ?BP: (!) 150/59 (!) 155/62  ?Pulse: (!) 58   ?Resp: 17 17  ?Temp:  36.7 ?C  ?SpO2: 96% 95%  ?  ?Last Pain:  ?Vitals:  ? 09/12/21 1406  ?TempSrc: Oral  ?PainSc: 0-No pain  ? ? ?  ?  ?  ?  ?  ?  ? ?Louann Sjogren ? ? ? ? ?

## 2021-09-12 NOTE — Op Note (Signed)
09/12/2021 ? ?1:13 PM ? ?PATIENT:  Jonathan Avery  88 y.o. male ? ?PRE-OPERATIVE DIAGNOSIS:  traumatic right middle finger tip amputation ? ?POST-OPERATIVE DIAGNOSIS:  traumatic right middle finger tip amputation ? ?PROCEDURE:  Procedure(s): ?REVISION AMPUTATION DIGIT (Right) MIDDLE/LONG FINGER  ? ?Findings transverse amputation right middle or long finger with protruding bone ? ?SURGEON:  Surgeon(s) and Role: ?   Carole Civil, MD - Primary ? ?PHYSICIAN ASSISTANT:  ? ?ASSISTANTS: None ? ?ANESTHESIA:   LMA general ? ?EBL: None ? ?BLOOD ADMINISTERED: None ? ?DRAINS: None ? ?LOCAL MEDICATIONS USED: 1% lidocaine plain digital block ? ?SPECIMEN: None ? ?DISPOSITION OF SPECIMEN: None ? ?COUNTS: Correct ? ?TOURNIQUET:   ?Total Tourniquet Time Documented: ?Upper Arm (Right) - 20 minutes ?Total: Upper Arm (Right) - 20 minutes ? ? ?DICTATION: .Dragon Dictation ? ?PLAN OF CARE: Discharge to home after PACU ? ?PATIENT DISPOSITION:  PACU - hemodynamically stable. ?  ?Delay start of Pharmacological VTE agent (>24hrs) due to surgical blood loss or risk of bleeding: not applicable ? ? ?This procedure was done as follows ? ?[name] was seen in preop and cleared for surgery by me.  The surgical site was confirmed as the right long or middle finger and marked.  He was taken to the operating for general LMA anesthesia ? ?He was supine with an arm table for his hand ? ?Prep was done with Betadine, sterile dressing was done ? ?After timeout we exsanguinated the limb 4 inch Esmarch tourniquet elevated 250 mmHg.  I examined the finger there was bone protruding and it was palpable I retracted the skin edges with 2 skin hooks after freeing the skin with a Valora Corporal ? ?I used a sagittal saw to remove the bone and then trimmed it further with the rongeurs I took an x-ray the bone appeared to be satisfactorily removed to a more proximal position.  I took irrigation and irrigated the distal amputation stump and then closed with two  3-0 nylon sutures ? ?I placed Xeroform moist gauze finger dressing released the tourniquet applied pressure there was no bleeding he was extubated and taken to recovery in stable condition ? ? ?Plan for postop: ? ?Keep the hand elevated there may be some bleeding that leaks through that is okay ? ?Change the dressing in several days ? ?Start dressing changes in several days ? ? ? ?

## 2021-09-12 NOTE — H&P (Signed)
?NEW PROBLEM//OFFICE VISIT ?  ?  ?    ?Chief Complaint  ?Patient presents with  ? right middle finger amputation  ?    DOI 09/05/2021.  Mashed end of finger between fender and tire on trailer. Was seen at [facility] ED. Has been given an antibiotic which he started Friday evening. Is not taking anything for pain. Denies pain. Right hand dominate.  ?  ?88 year old male was working on a tire he amputated the tip of his right long or middle finger he was seen in the emergency room and it was cleaned and treated and dressed he is here for evaluation and management ?  ?  ?  ?No major related findings on review of systems ?  ?BP (!) 168/68   Pulse 72   Ht 5' 10"  (1.778 m)   Wt 199 lb 3.2 oz (90.4 kg)   BMI 28.58 kg/m?  ?  ?Body mass index is 28.58 kg/m?. ?  ?General appearance: Well-developed well-nourished no gross deformities ? ?Cardiovascular normal pulse and perfusion normal color without edema ? ?Neurologically no sensation loss or deficits or pathologic reflexes ?  ?Psychological: Awake alert and oriented x3 mood and affect normal ?  ?Skin no lacerations or ulcerations no nodularity no palpable masses, no erythema or nodularity ?  ?Musculoskeletal: His fingertip amputation is for the most part transverse.  He is very tender of course there is not a lot of swelling although the dressing was very tight there is a clean wound bed ? ?The x-ray shows prominent bone which will have to be resected ?  ?  ?  ?  ?  ?    ?Past Medical History:  ?Diagnosis Date  ? BPH (benign prostatic hypertrophy)    ? Cancer [facility])    ?  skin cancer - basal  ? Carotid artery occlusion    ? Diabetes ([place])    ?  Type II  ? High cholesterol    ? Hypertension    ? Myocardial infarction [facility])    ? Peripheral vascular disease ([place])    ? Stroke [facility])    ?    documented 12/28/2019- ? TIA  " weeks 3 weeks ago, expresive aphasia lasted 2 -3 minutes."   ?  ?  ?     ?Past Surgical History:  ?Procedure Laterality Date  ? CARDIAC CATHETERIZATION      ?  CHOLECYSTECTOMY   1973  ? COLONOSCOPY   07/20/2006  ?  Dr. [name]:Status post right hemicolectomy. Residual colonic mucosa appeared normal, normal rectum.   ? COLONOSCOPY   2006/2007  ?  sprawling villous adenoma at ileocecal valve  ? COLONOSCOPY   2011  ?  Dr. [name]: normal rectum, pancolonic diverticulosis, 2 diminutive polyps, with path benign polypoid colonic mucosa  ? COLONOSCOPY N/A 01/02/2015  ?  Procedure: COLONOSCOPY;  Surgeon: Daneil Dolin, MD;  Location: [facility];  Service: Endoscopy;  Laterality: N/A;  1245  ? CORONARY ANGIOPLASTY      ? ENDARTERECTOMY Right 12/29/2019  ?  Procedure: ENDARTERECTOMY CAROTID;  Surgeon: [name], MD;  Location: [facility] OR;  Service: Vascular;  Laterality: Right;  ? ESOPHAGOGASTRODUODENOSCOPY (EGD) WITH PROPOFOL N/A 01/13/2018  ?  Procedure: ESOPHAGOGASTRODUODENOSCOPY (EGD) WITH PROPOFOL;  Surgeon: [name], MD;  Location: [facility] ENDOSCOPY;  Service: Endoscopy;  Laterality: N/A;  ? EUS N/A 01/13/2018  ?  Procedure: UPPER ENDOSCOPIC ULTRASOUND (EUS) RADIAL;  Surgeon: [name], MD;  Location: [facility] ENDOSCOPY;  Service: Endoscopy;  Laterality: N/A;  ? FINE NEEDLE ASPIRATION N/A 01/13/2018  ?  Procedure: FINE NEEDLE ASPIRATION (FNA) LINEAR;  Surgeon: [name], MD;  Location: [facility] ENDOSCOPY;  Service: Endoscopy;  Laterality: N/A;  ? open hemicolectomy   2007  ?  due to villous adenoma  ? PATCH ANGIOPLASTY Right 12/29/2019  ?  Procedure: RIGHT CAROTID PATCH ANGIOPLASTY USING HEMASHIELD PLATINUM FINESSE;  Surgeon: [name], MD;  Location: [place];  Service: Vascular;  Laterality: Right;  ?  ?  ?     ?Family History  ?Problem Relation Age of Onset  ? Ulcers Father    ? Diabetes type II Brother    ? Diabetes type II Brother    ? Colon cancer Neg Hx    ?  ?Social History  ?  ?     ?Tobacco Use  ? Smoking status: Former  ?    Types: Cigars  ? Smokeless tobacco: Never  ?Vaping Use  ? Vaping Use: Never used  ?Substance Use Topics  ? Alcohol use: No  ?    Alcohol/week: 0.0  standard drinks  ? Drug use: No  ?  ?  ?No Known Allergies ?  ?Active Medications  ?    ?Current Meds  ?Medication Sig  ? amLODipine (NORVASC) 10 MG tablet Take 1 tablet (10 mg total) by mouth daily.  ? aspirin 81 MG tablet Take 81 mg by mouth at bedtime.   ? cephALEXin (KEFLEX) 500 MG capsule Take 1 capsule (500 mg total) by mouth 4 (four) times daily.  ? Cholecalciferol (VITAMIN D3) 10 MCG (400 UNIT) CAPS Take 2 capsules by mouth daily.  ? cloNIDine (CATAPRES) 0.1 MG tablet Take 1 tablet (0.1 mg total) by mouth 2 (two) times daily.  ? diphenhydramine-acetaminophen (TYLENOL PM) 25-500 MG TABS tablet Take 1 tablet by mouth at bedtime as needed (sleep).  ? hydrALAZINE (APRESOLINE) 100 MG tablet Take 1 tablet (100 mg total) by mouth 3 (three) times daily. Dose change.  ? insulin aspart (NOVOLOG) 100 UNIT/ML FlexPen Inject 15 Units into the skin daily as needed for high blood sugar.  ? LANTUS SOLOSTAR 100 UNIT/ML Solostar Pen Inject 30 Units into the skin 2 (two) times daily.  ? magnesium gluconate (MAGONATE) 500 MG tablet Take 500 mg by mouth daily.  ? metFORMIN (GLUCOPHAGE) 500 MG tablet Take 500 mg by mouth in the morning and at bedtime.  ? olmesartan (BENICAR) 40 MG tablet Take 40 mg by mouth daily.  ? rosuvastatin (CRESTOR) 10 MG tablet Take 10 mg by mouth daily.  ? spironolactone (ALDACTONE) 25 MG tablet Take 0.5 tablets (12.5 mg total) by mouth 2 (two) times daily.  ? tamsulosin (FLOMAX) 0.4 MG CAPS capsule Take 0.4 mg by mouth 2 (two) times daily.  ? vitamin B-12 (CYANOCOBALAMIN) 500 MCG tablet Take 500 mcg by mouth daily.  ?  ?  ?  ?  ?MEDICAL DECISION MAKING ?  ?A.  ?    ?Encounter Diagnosis  ?Name Primary?  ? Amputation of tip of finger, initial encounter, right middle finger, right long finger Yes  ?  ?  ?B. DATA ANALYSED: ? ? ?IMAGING: ?Interpretation of images: I have personally reviewed the images and my interpretation is images show partial amputation including bone right long finger ?Orders: Surgical  revision amputation right long finger ? ?Outside records reviewed: Emergency room records ?  ? ?C. MANAGEMENT  ?  ?Recommend shortening of the bone and then wound healing by secondary intention ? ?Plan for  revision amputation right long finger on Friday ?  ?No orders of the defined types were placed in this encounter. ?  ?  ?  ?Arther Abbott, MD ?  ?

## 2021-09-12 NOTE — Interval H&P Note (Signed)
History and Physical Interval Note: ? ?09/12/2021 ?12:21 PM ? ?Jonathan Avery  has presented today for surgery, with the diagnosis of traumatic right middle finger tip amputation.  The various methods of treatment have been discussed with the patient and family. After consideration of risks, benefits and other options for treatment, the patient has consented to  Procedure(s): ?AMPUTATION DIGIT (Right)  involving right middle or long finger as a surgical intervention.  The patient's history has been reviewed, patient examined, no change in status, stable for surgery.  I have reviewed the patient's chart and labs.  Questions were answered to the patient's satisfaction.   ? ? ?Arther Abbott ? ? ?

## 2021-09-12 NOTE — Brief Op Note (Signed)
09/12/2021 ? ?1:09 PM ? ?PATIENT:  Jonathan Avery  88 y.o. male ? ?PRE-OPERATIVE DIAGNOSIS:  traumatic right middle finger tip amputation ? ?POST-OPERATIVE DIAGNOSIS:  traumatic right middle finger tip amputation ? ?PROCEDURE:  Procedure(s): ?REVISION AMPUTATION DIGIT (Right) MIDDLE/LONG FINGER  ? ?Findings transverse amputation right middle or long finger with protruding bone ? ?SURGEON:  Surgeon(s) and Role: ?   Carole Civil, MD - Primary ? ?PHYSICIAN ASSISTANT:  ? ?ASSISTANTS: None ? ?ANESTHESIA:   LMA general ? ?EBL: None ? ?BLOOD ADMINISTERED: None ? ?DRAINS: None ? ?LOCAL MEDICATIONS USED: 1% lidocaine plain digital block ? ?SPECIMEN: None ? ?DISPOSITION OF SPECIMEN: None ? ?COUNTS: Correct ? ?TOURNIQUET:   ?Total Tourniquet Time Documented: ?Upper Arm (Right) - 20 minutes ?Total: Upper Arm (Right) - 20 minutes ? ? ?DICTATION: .Dragon Dictation ? ?PLAN OF CARE: Discharge to home after PACU ? ?PATIENT DISPOSITION:  PACU - hemodynamically stable. ?  ?Delay start of Pharmacological VTE agent (>24hrs) due to surgical blood loss or risk of bleeding: not applicable ? ? ?This procedure was done as follows ? ?[name] was seen in preop and cleared for surgery by me.  The surgical site was confirmed as the right long or middle finger and marked.  He was taken to the operating for general LMA anesthesia ? ?He was supine with an arm table for his hand ? ?Prep was done with Betadine, sterile dressing was done ? ?After timeout we exsanguinated the limb 4 inch Esmarch tourniquet elevated 250 mmHg.  I examined the finger there was bone protruding and it was palpable I retracted the skin edges with 2 skin hooks after freeing the skin with a Valora Corporal ? ?I used a sagittal saw to remove the bone and then trimmed it further with the rongeurs I took an x-ray the bone appeared to be satisfactorily removed to a more proximal position.  I took irrigation and irrigated the distal amputation stump and then closed with two  3-0 nylon sutures ? ?I placed Xeroform moist gauze finger dressing released the tourniquet applied pressure there was no bleeding he was extubated and taken to recovery in stable condition ? ? ?Plan for postop: ? ?Keep the hand elevated there may be some bleeding that leaks through that is okay ? ?Change the dressing in several days ? ?Start dressing changes in several days ? ? ? ?

## 2021-09-16 ENCOUNTER — Encounter (HOSPITAL_COMMUNITY): Payer: Self-pay | Admitting: Orthopedic Surgery

## 2021-09-19 ENCOUNTER — Encounter: Payer: Self-pay | Admitting: Orthopedic Surgery

## 2021-09-19 ENCOUNTER — Ambulatory Visit (INDEPENDENT_AMBULATORY_CARE_PROVIDER_SITE_OTHER): Payer: Medicare HMO | Admitting: Orthopedic Surgery

## 2021-09-19 DIAGNOSIS — S68119A Complete traumatic metacarpophalangeal amputation of unspecified finger, initial encounter: Secondary | ICD-10-CM

## 2021-09-19 NOTE — Patient Instructions (Addendum)
Follow up on Monday 09/22/21 for suture removal 7:30 Monday morning.  ? ?Abby will check patient in. Dr. Aline Brochure approved.  ?

## 2021-09-19 NOTE — Progress Notes (Signed)
FOLLOW UP  ? ?Encounter Diagnosis  ?Name Primary?  ? Amputation of tip of finger, initial encounter, right middle finger, right long finger Yes  ? ? ? ?Chief Complaint  ?Patient presents with  ? Hand Injury  ?  09/12/2021 REVISION AMPUTATION DIGIT (Right) MIDDLE/LONG FINGER  ?Doing ok, no pain meds since 4/22  ? ? ? ?Jonathan Avery status post resection of portion of the bone from his transverse amputation to the right long finger he is doing well he is not taking any pain medicine I was going to take his stitches out today but unguinal leave him for 2-3 more days and have them taken out on Monday and then we can start dressing changes ? ?He is on Keflex 500 mg ? ?We will see him at 7:30 AM Monday morning for suture removal and then redressing and start dressing changes at wound care ?

## 2021-09-22 ENCOUNTER — Other Ambulatory Visit: Payer: Self-pay

## 2021-09-22 ENCOUNTER — Ambulatory Visit (INDEPENDENT_AMBULATORY_CARE_PROVIDER_SITE_OTHER): Payer: Medicare HMO | Admitting: Orthopedic Surgery

## 2021-09-22 DIAGNOSIS — S68119A Complete traumatic metacarpophalangeal amputation of unspecified finger, initial encounter: Secondary | ICD-10-CM

## 2021-09-22 MED ORDER — AMLODIPINE BESYLATE 10 MG PO TABS: 10.0000 mg | ORAL_TABLET | Freq: Every day | ORAL | 1 refills | Status: DC

## 2021-09-22 MED ORDER — TRAMADOL HCL 50 MG PO TABS: 50.0000 mg | ORAL_TABLET | Freq: Four times a day (QID) | ORAL | 0 refills | Status: DC | PRN

## 2021-09-23 ENCOUNTER — Telehealth: Payer: Self-pay | Admitting: Orthopedic Surgery

## 2021-09-23 NOTE — Telephone Encounter (Signed)
Spoke with patient. The order is in. I tried calling PT but no answer. Patient will call to schedule appt. Needs to be seen this week. Follow up with Dr.Harrison 09/29/21 ?

## 2021-09-23 NOTE — Telephone Encounter (Signed)
Patient was seen yesterday morning and asked about going to the wound care every other day and the nurse was suppose to get back with him and no one has called him back to advise.  ? ? ?

## 2021-09-23 NOTE — Progress Notes (Signed)
The nurse has removed the sutures and we have placed orders for the patient to have dressing changes and a follow-up in a week ?

## 2021-09-25 ENCOUNTER — Encounter (HOSPITAL_COMMUNITY): Payer: Self-pay | Admitting: Physical Therapy

## 2021-09-25 ENCOUNTER — Ambulatory Visit (HOSPITAL_COMMUNITY): Payer: Medicare HMO | Attending: Orthopedic Surgery | Admitting: Physical Therapy

## 2021-09-25 DIAGNOSIS — Z4789 Encounter for other orthopedic aftercare: Secondary | ICD-10-CM | POA: Insufficient documentation

## 2021-09-25 DIAGNOSIS — S68119D Complete traumatic metacarpophalangeal amputation of unspecified finger, subsequent encounter: Secondary | ICD-10-CM | POA: Insufficient documentation

## 2021-09-25 DIAGNOSIS — S68119A Complete traumatic metacarpophalangeal amputation of unspecified finger, initial encounter: Secondary | ICD-10-CM | POA: Insufficient documentation

## 2021-09-25 NOTE — Therapy (Signed)
[facility] ?[address] ?[place], [place], [address] ?Phone: [phone]   Fax:  [phone] ? ?Wound Care Evaluation ? ?Patient Details  ?Name: Jonathan Avery ?MRN: [id number] ?Date of Birth: 03/11/1931 ?Referring Provider (PT): Arther Abbott ? ? ?Encounter Date: 09/25/2021 ? ? PT End of Session - 09/25/21 1003   ? ? Visit Number 1   ? Number of Visits 12   ? Date for PT Re-Evaluation 11/06/21   ? Authorization Time Period [employer] medicare- requested 12 visits at eval   ? Progress Note Due on Visit 12   ? PT Start Time 1005   ? PT Stop Time 1045   ? PT Time Calculation (min) 40 min   ? ?  ?  ? ?  ? ? ?Past Medical History:  ?Diagnosis Date  ? BPH (benign prostatic hypertrophy)   ? Cancer [facility])   ? skin cancer - basal  ? Carotid artery occlusion   ? Diabetes ([place])   ? Type II  ? High cholesterol   ? Hypertension   ? Myocardial infarction [facility])   ? Peripheral vascular disease ([place])   ? Stroke [facility])   ?   documented 12/28/2019- ? TIA  " weeks 3 weeks ago, expresive aphasia lasted 2 -3 minutes."   ? ? ?Past Surgical History:  ?Procedure Laterality Date  ? AMPUTATION Right 09/12/2021  ? Procedure: AMPUTATION DIGIT;  Surgeon: Carole Civil, MD;  [facility] OR[facility];  Service: Orthopedics;  Laterality: Right;  ? CARDIAC CATHETERIZATION    ? CHOLECYSTECTOMY  1973  ? COLONOSCOPY  07/20/2006  ? Dr. [name]:Status post right hemicolectomy. Residual colonic mucosa appeared normal, normal rectum.   ? COLONOSCOPY  2006/2007  ? sprawling villous adenoma at ileocecal valve  ? COLONOSCOPY  2011  ? Dr. [name]: normal rectum, pancolonic diverticulosis, 2 diminutive polyps, with path benign polypoid colonic mucosa  ? COLONOSCOPY N/A 01/02/2015  ? Procedure: COLONOSCOPY;  Surgeon: Daneil Dolin, MD;  [facility];  Service: Endoscopy;  Laterality: N/A;  1245  ? CORONARY ANGIOPLASTY    ? ENDARTERECTOMY Right 12/29/2019  ? Procedure: ENDARTERECTOMY CAROTID;  Surgeon: [name], MD;   Location: [facility] OR;  Service: Vascular;  Laterality: Right;  ? ESOPHAGOGASTRODUODENOSCOPY (EGD) WITH PROPOFOL N/A 01/13/2018  ? Procedure: ESOPHAGOGASTRODUODENOSCOPY (EGD) WITH PROPOFOL;  Surgeon: [name], MD;  [facility];  Service: Endoscopy;  Laterality: N/A;  ? EUS N/A 01/13/2018  ? Procedure: UPPER ENDOSCOPIC ULTRASOUND (EUS) RADIAL;  Surgeon: [name], MD;  [facility];  Service: Endoscopy;  Laterality: N/A;  ? FINE NEEDLE ASPIRATION N/A 01/13/2018  ? Procedure: FINE NEEDLE ASPIRATION (FNA) LINEAR;  Surgeon: [name], MD;  [facility];  Service: Endoscopy;  Laterality: N/A;  ? open hemicolectomy  2007  ? due to villous adenoma  ? PATCH ANGIOPLASTY Right 12/29/2019  ? Procedure: RIGHT CAROTID PATCH ANGIOPLASTY USING HEMASHIELD PLATINUM FINESSE;  Surgeon: [name], MD;  Location: [place];  Service: Vascular;  Laterality: Right;  ? ? ?There were no vitals filed for this visit. ? ? ? ? [facility] PT Assessment - 09/25/21 0001   ? ?  ? Assessment  ? Medical Diagnosis Traumatic amputation of Rt middle finger   ? Referring Provider (PT) Arther Abbott   ? Onset Date/Surgical Date 09/05/21   ? Hand Dominance Right   ? Prior Therapy none   ?  ? Precautions  ? Precautions --  infection  ?  ? Balance Screen  ? Has the patient fallen in the past 6 months No   ? Has the patient had a decrease in activity level because of a fear of falling?  No   ? Is the patient reluctant to leave their home because of a fear of falling?  No   ? ?  ?  ? ?  ? ? Wound Therapy - 09/25/21 0001   ? ? Subjective Pt had his Rt middle finger pinned on 4/14. He pulled it out and noted that part of his finger was missing.  PT went to ER then had an I&D.   ? Patient and Family Stated Goals wound to heal   ? Date of Onset 09/05/21   ? Prior Treatments ER on 4/14; I&D on 4/21; stitches taken out on 5/1   ? Pain Scale 0-10   ? Pain Score 10-Worst pain ever   with cleansing.  ? Evaluation and Treatment  Procedures Explained to Patient/Family Yes   ? Evaluation and Treatment Procedures agreed to   ? Incision Properties Date First Assessed: 09/12/21 Time First Assessed: 1202 Location: Finger (Comment which one) Location Orientation: Right  ? Dressing Type Impregnated gauze (petrolatum);Gauze (Comment)   ? Dressing Old drainage (marked)   ? Dressing Change Frequency PRN   ? Incision Length (cm) --   wound 2.2 x 1.2; granulation of 20%  ? Closure None   ? Drainage Amount Minimal   ? Drainage Description Purulent   ? Treatment Cleansed;Other (Comment)   wound mildly debrided which caused significant increased pain  ? Wound Therapy - Clinical Statement [name] is an 88 yo male who was manuevering a trailer and accidently got his finger caught on 4/14.  When he pulled the finger out he noted that part of the finger was missing.  He recieved treatment at the ER and then had an I&D on 4/21.  On 5/1 stiches were taken out.  Of note, the therapist removed two stitches from the wound today.  The wound has purulent drainage, dried blood and slough with only 20% granualtion.  The pt was slightly intolerant to debridement therefor minimal debridement was completed.  The wound was covered with honey, 2x2 secured with 2" kling and coban with netting.  [name] will benefit from skilled PT for debridement and dressing change to improve granulation and keep an environment that is positive for healing.   ? Factors Delaying/Impairing Wound Healing Diabetes Mellitus;Infection - systemic/local;Multiple medical problems   ? Hydrotherapy Plan Debridement;Dressing change;Patient/family education   ? Wound Therapy - Frequency 2X / week   x 6weeks  ? Wound Therapy - Current Recommendations PT   ? Wound Plan continue cleansing, debridement and appropriate dressing   ? ?  ?  ? ?  ? ? ? ? ? ? ? ? ? ? ? ? ? ? ? PT Education - 09/25/21 1227   ? ? Education Details keep dressing dry.   ? Person(s) Educated Patient   ? Methods Explanation   ?  Comprehension Verbalized understanding   ? ?  ?  ? ?  ? ? ? PT Short Term Goals - 09/25/21 1224   ? ?  ? PT SHORT TERM GOAL #1  ? Title PT wound to be 100% granulated   ? Time 3   ? Period Weeks   ? Status New   ? Target Date 10/16/21   ?  ? PT SHORT TERM GOAL #2  ?  Title PT pain to be no greater than a 5/10 with treatment   ? Time 3   ? Period Weeks   ? Status New   ? ?  ?  ? ?  ? ? ? ? PT Long Term Goals - 09/25/21 1225   ? ?  ? PT LONG TERM GOAL #1  ? Title PT wound to be healed   ? Time 6   ? Period Weeks   ? Status New   ? Target Date 11/06/21   ?  ? PT LONG TERM GOAL #2  ? Title Pt pain to be no greater than a 2/10 for middle finger.   ? Time 6   ? Period Weeks   ? ?  ?  ? ?  ? ? ? ? ? ? ? Plan - 09/25/21 1226   ? ? Clinical Impression Statement as above   ? Personal Factors and Comorbidities Age;Comorbidity 3+   ? Comorbidities MI, PVD, DM, CAD   ? Examination-Activity Limitations Bathing;Carry;Dressing   ? Examination-Participation Restrictions Cleaning;[employer] [employer] Work   ? Stability/Clinical Decision Making Stable/Uncomplicated   ? Clinical Decision Making Low   ? Rehab Potential Good   ? PT Frequency 2x / week   ? PT Duration 6 weeks   ? PT Treatment/Interventions Other (comment)   debridement and dressing changes  ? PT Next Visit Plan continue with wound care.   ? ?  ?  ? ?  ? ? ?Patient will benefit from skilled therapeutic intervention in order to improve the following deficits and impairments:  Pain, Decreased skin integrity ? ?Visit Diagnosis: ?Amputation of tip of finger, subsequent encounter ? ? ? ?Problem List ?Patient Active Problem List  ? Diagnosis Date Noted  ? Amputation, finger, traumatic   ? Hypertensive emergency 05/30/2020  ? Hypertensive encephalopathy 05/30/2020  ? Asymptomatic carotid artery stenosis without infarction, right 12/29/2019  ? Pancreatic mass   ? Diabetes ([place]) 11/29/2017  ? Essential hypertension 11/29/2017  ? Dyslipidemia 11/29/2017  ? Myocardial infarction ([place]) 11/29/2017  ?  BPH (benign prostatic hyperplasia) 11/29/2017  ? SBO (small bowel obstruction) ([place]) 11/29/2017  ? CKD (chronic kidney disease), stage III ([place]) 11/29/2017  ? Hx of colonic polyps   ? History of colonic polyp

## 2021-09-29 ENCOUNTER — Ambulatory Visit (HOSPITAL_COMMUNITY): Payer: Medicare HMO | Admitting: Physical Therapy

## 2021-09-29 ENCOUNTER — Encounter: Payer: Self-pay | Admitting: Orthopedic Surgery

## 2021-09-29 ENCOUNTER — Ambulatory Visit (INDEPENDENT_AMBULATORY_CARE_PROVIDER_SITE_OTHER): Payer: Medicare HMO | Admitting: Orthopedic Surgery

## 2021-09-29 DIAGNOSIS — S68119D Complete traumatic metacarpophalangeal amputation of unspecified finger, subsequent encounter: Secondary | ICD-10-CM

## 2021-09-29 NOTE — Progress Notes (Signed)
FOLLOW UP  ? ?Encounter Diagnosis  ?Name Primary?  ? Amputation of tip of finger, subsequent encounter Yes  ? ? ? ?Chief Complaint  ?Patient presents with  ? Post-op Follow-up  ?  Amputation Right long finger DOS 09/12/21  ? ? ? ?Revision of a fingertip amputation right long finger wound looks clean patient is in wound care continue ? ?Follow-up 24 May or close to that date ?

## 2021-10-01 ENCOUNTER — Encounter (HOSPITAL_COMMUNITY): Payer: Self-pay

## 2021-10-01 ENCOUNTER — Ambulatory Visit (HOSPITAL_COMMUNITY): Payer: Medicare HMO

## 2021-10-01 DIAGNOSIS — Z4789 Encounter for other orthopedic aftercare: Secondary | ICD-10-CM | POA: Diagnosis not present

## 2021-10-01 DIAGNOSIS — S68119D Complete traumatic metacarpophalangeal amputation of unspecified finger, subsequent encounter: Secondary | ICD-10-CM

## 2021-10-01 NOTE — Therapy (Signed)
[facility] ?[place] ?[address] ?[place], [place], [address] ?Phone: [phone]   Fax:  [phone] ? ?Wound Care Therapy ? ?Patient Details  ?Name: Jonathan Avery ?MRN: [id number] ?Date of Birth: 1931/03/14 ?Referring Provider (PT): Arther Abbott ? ? ?Encounter Date: 10/01/2021 ? ? PT End of Session - 10/01/21 1835   ? ? Visit Number 2   ? Number of Visits 12   ? Date for PT Re-Evaluation 11/06/21   ? Authorization Time Period [employer] medicare- requested 12 visits at eval   ? Progress Note Due on Visit 12   ? PT Start Time 1050   ? PT Stop Time 1120   ? PT Time Calculation (min) 30 min   ? Activity Tolerance Patient limited by pain;Patient tolerated treatment well;No increased pain   ? Behavior During Therapy [facility] for tasks assessed/performed   ? ?  ?  ? ?  ? ? ?Past Medical History:  ?Diagnosis Date  ? BPH (benign prostatic hypertrophy)   ? Cancer [facility])   ? skin cancer - basal  ? Carotid artery occlusion   ? Diabetes ([place])   ? Type II  ? High cholesterol   ? Hypertension   ? Myocardial infarction [facility])   ? Peripheral vascular disease ([place])   ? Stroke [facility])   ?   documented 12/28/2019- ? TIA  " weeks 3 weeks ago, expresive aphasia lasted 2 -3 minutes."   ? ? ?Past Surgical History:  ?Procedure Laterality Date  ? AMPUTATION Right 09/12/2021  ? Procedure: AMPUTATION DIGIT;  Surgeon: Carole Civil, MD;  [facility];  Service: Orthopedics;  Laterality: Right;  ? CARDIAC CATHETERIZATION    ? CHOLECYSTECTOMY  1973  ? COLONOSCOPY  07/20/2006  ? Dr. [name]:Status post right hemicolectomy. Residual colonic mucosa appeared normal, normal rectum.   ? COLONOSCOPY  2006/2007  ? sprawling villous adenoma at ileocecal valve  ? COLONOSCOPY  2011  ? Dr. [name]: normal rectum, pancolonic diverticulosis, 2 diminutive polyps, with path benign polypoid colonic mucosa  ? COLONOSCOPY N/A 01/02/2015  ? Procedure: COLONOSCOPY;  Surgeon: Daneil Dolin, MD;  [facility];  Service:  Endoscopy;  Laterality: N/A;  1245  ? CORONARY ANGIOPLASTY    ? ENDARTERECTOMY Right 12/29/2019  ? Procedure: ENDARTERECTOMY CAROTID;  Surgeon: [name], MD;  Location: [facility] OR;  Service: Vascular;  Laterality: Right;  ? ESOPHAGOGASTRODUODENOSCOPY (EGD) WITH PROPOFOL N/A 01/13/2018  ? Procedure: ESOPHAGOGASTRODUODENOSCOPY (EGD) WITH PROPOFOL;  Surgeon: [name], MD;  [facility];  Service: Endoscopy;  Laterality: N/A;  ? EUS N/A 01/13/2018  ? Procedure: UPPER ENDOSCOPIC ULTRASOUND (EUS) RADIAL;  Surgeon: [name], MD;  [facility];  Service: Endoscopy;  Laterality: N/A;  ? FINE NEEDLE ASPIRATION N/A 01/13/2018  ? Procedure: FINE NEEDLE ASPIRATION (FNA) LINEAR;  Surgeon: [name], MD;  [facility];  Service: Endoscopy;  Laterality: N/A;  ? open hemicolectomy  2007  ? due to villous adenoma  ? PATCH ANGIOPLASTY Right 12/29/2019  ? Procedure: RIGHT CAROTID PATCH ANGIOPLASTY USING HEMASHIELD PLATINUM FINESSE;  Surgeon: [name], MD;  Location: [place];  Service: Vascular;  Laterality: Right;  ? ? ?There were no vitals filed for this visit. ? ? ? Subjective Assessment - 10/01/21 1310   ? ? Subjective Pt arrived with dressings intact, reports comfort and no pain at entrance.   ? Currently in Pain? No/denies   ? ?  ?  ? ?  ? ? ? ? ? ? ? ? ? ? ? ?  Wound Therapy - 10/01/21 0001   ? ? Subjective Pt arrived with dressings intact, reports comfort and no pain at entrance.   ? Patient and Family Stated Goals wound to heal   ? Date of Onset 09/05/21   ? Prior Treatments ER on 4/14; I&D on 4/21; stitches taken out on 5/1   ? Pain Scale 0-10   ? Pain Score 0-No pain   ? Evaluation and Treatment Procedures Explained to Patient/Family Yes   ? Evaluation and Treatment Procedures agreed to   ? Incision Properties Date First Assessed: 09/12/21 Time First Assessed: 1202 Location: Finger (Comment which one) Location Orientation: Right  ? Dressing Type Impregnated gauze  (petrolatum);Gauze (Comment)   ? Dressing Old drainage (marked)   ? Dressing Change Frequency PRN   ? Closure None   ? Drainage Amount Minimal   ? Drainage Description Serous   ? Treatment Cleansed;Other (Comment)   debridement  ? Wound Therapy - Clinical Statement Selective debridement for removal of slough wound bed and dead skin perimeter to promote healing.  Pt hand very shakey this session, caution with debridment.  Wound bed light yellow, no granulation tissues noted.  Continued with medihoney, xeroform and gauze with coban and netting.  Reports of comfort at EOS.  Picture taken (see media).   ? Factors Delaying/Impairing Wound Healing Diabetes Mellitus;Infection - systemic/local;Multiple medical problems   ? Hydrotherapy Plan Debridement;Dressing change;Patient/family education   ? Wound Therapy - Frequency 2X / week   6 weeks  ? Wound Therapy - Current Recommendations PT   ? Wound Plan continue cleansing, debridement and appropriate dressing   ? ?  ?  ? ?  ? ? ? ? ? ? ? ? ? ? ? ? PT Short Term Goals - 09/25/21 1224   ? ?  ? PT SHORT TERM GOAL #1  ? Title PT wound to be 100% granulated   ? Time 3   ? Period Weeks   ? Status New   ? Target Date 10/16/21   ?  ? PT SHORT TERM GOAL #2  ? Title PT pain to be no greater than a 5/10 with treatment   ? Time 3   ? Period Weeks   ? Status New   ? ?  ?  ? ?  ? ? ? ? PT Long Term Goals - 09/25/21 1225   ? ?  ? PT LONG TERM GOAL #1  ? Title PT wound to be healed   ? Time 6   ? Period Weeks   ? Status New   ? Target Date 11/06/21   ?  ? PT LONG TERM GOAL #2  ? Title Pt pain to be no greater than a 2/10 for middle finger.   ? Time 6   ? Period Weeks   ? ?  ?  ? ?  ? ? ? ? ? ? ? ? ? ?Patient will benefit from skilled therapeutic intervention in order to improve the following deficits and impairments:    ? ?Visit Diagnosis: ?Amputation of tip of finger, subsequent encounter ? ? ? ? ?Problem List ?Patient Active Problem List  ? Diagnosis Date Noted  ? Amputation, finger,  traumatic   ? Hypertensive emergency 05/30/2020  ? Hypertensive encephalopathy 05/30/2020  ? Asymptomatic carotid artery stenosis without infarction, right 12/29/2019  ? Pancreatic mass   ? Diabetes ([place]) 11/29/2017  ? Essential hypertension 11/29/2017  ? Dyslipidemia 11/29/2017  ? Myocardial infarction ([place]) 11/29/2017  ? BPH (benign  prostatic hyperplasia) 11/29/2017  ? SBO (small bowel obstruction) ([place]) 11/29/2017  ? CKD (chronic kidney disease), stage III ([place]) 11/29/2017  ? Hx of colonic polyps   ? History of colonic polyps 10/10/2009  ? ?Ihor Austin, LPTA/CLT; CBIS ?[phone] ? ?Aldona Lento, PTA ?10/01/2021, 6:36 PM ? ?[facility] ?[address] ?[place], [place], [address] ?Phone: [phone]   Fax:  [phone] ? ?Name: RAUL TORRANCE ?MRN: [id number] ?Date of Birth: 03-Apr-1931 ? ? ? ? ?

## 2021-10-03 ENCOUNTER — Encounter (HOSPITAL_COMMUNITY): Payer: Self-pay

## 2021-10-03 ENCOUNTER — Ambulatory Visit (HOSPITAL_COMMUNITY): Payer: Medicare HMO

## 2021-10-03 DIAGNOSIS — S68119D Complete traumatic metacarpophalangeal amputation of unspecified finger, subsequent encounter: Secondary | ICD-10-CM | POA: Diagnosis not present

## 2021-10-03 DIAGNOSIS — Z4789 Encounter for other orthopedic aftercare: Secondary | ICD-10-CM | POA: Diagnosis not present

## 2021-10-03 NOTE — Therapy (Signed)
[facility] ?[facility] ?[address] ?[place], [place], [address] ?Phone: [phone]   Fax:  [phone] ? ?Wound Care Therapy ? ?Patient Details  ?Name: Jonathan Avery ?MRN: [id number] ?Date of Birth: 14-Jun-1930 ?Referring Provider (PT): Arther Abbott ? ? ?Encounter Date: 10/03/2021 ? ? PT End of Session - 10/03/21 1321   ? ? Visit Number 3   ? Number of Visits 12   ? Date for PT Re-Evaluation 11/06/21   ? Authorization Time Period [employer] medicare- requested 12 visits at eval   ? Progress Note Due on Visit 12   ? PT Start Time 1006   ? PT Stop Time 1037   ? PT Time Calculation (min) 31 min   ? Activity Tolerance Patient tolerated treatment well   ? Behavior During Therapy [facility] for tasks assessed/performed   ? ?  ?  ? ?  ? ? ?Past Medical History:  ?Diagnosis Date  ? BPH (benign prostatic hypertrophy)   ? Cancer [facility])   ? skin cancer - basal  ? Carotid artery occlusion   ? Diabetes ([place])   ? Type II  ? High cholesterol   ? Hypertension   ? Myocardial infarction [facility])   ? Peripheral vascular disease ([place])   ? Stroke [facility])   ?   documented 12/28/2019- ? TIA  " weeks 3 weeks ago, expresive aphasia lasted 2 -3 minutes."   ? ? ?Past Surgical History:  ?Procedure Laterality Date  ? AMPUTATION Right 09/12/2021  ? Procedure: AMPUTATION DIGIT;  Surgeon: Carole Civil, MD;  [facility];  Service: Orthopedics;  Laterality: Right;  ? CARDIAC CATHETERIZATION    ? CHOLECYSTECTOMY  1973  ? COLONOSCOPY  07/20/2006  ? Dr. [name]:Status post right hemicolectomy. Residual colonic mucosa appeared normal, normal rectum.   ? COLONOSCOPY  2006/2007  ? sprawling villous adenoma at ileocecal valve  ? COLONOSCOPY  2011  ? Dr. [name]: normal rectum, pancolonic diverticulosis, 2 diminutive polyps, with path benign polypoid colonic mucosa  ? COLONOSCOPY N/A 01/02/2015  ? Procedure: COLONOSCOPY;  Surgeon: Daneil Dolin, MD;  [facility];  Service: Endoscopy;  Laterality: N/A;  1245  ? CORONARY  ANGIOPLASTY    ? ENDARTERECTOMY Right 12/29/2019  ? Procedure: ENDARTERECTOMY CAROTID;  Surgeon: [name], MD;  Location: [facility] OR;  Service: Vascular;  Laterality: Right;  ? ESOPHAGOGASTRODUODENOSCOPY (EGD) WITH PROPOFOL N/A 01/13/2018  ? Procedure: ESOPHAGOGASTRODUODENOSCOPY (EGD) WITH PROPOFOL;  Surgeon: [name], MD;  [facility];  Service: Endoscopy;  Laterality: N/A;  ? EUS N/A 01/13/2018  ? Procedure: UPPER ENDOSCOPIC ULTRASOUND (EUS) RADIAL;  Surgeon: [name], MD;  [facility];  Service: Endoscopy;  Laterality: N/A;  ? FINE NEEDLE ASPIRATION N/A 01/13/2018  ? Procedure: FINE NEEDLE ASPIRATION (FNA) LINEAR;  Surgeon: [name], MD;  [facility];  Service: Endoscopy;  Laterality: N/A;  ? open hemicolectomy  2007  ? due to villous adenoma  ? PATCH ANGIOPLASTY Right 12/29/2019  ? Procedure: RIGHT CAROTID PATCH ANGIOPLASTY USING HEMASHIELD PLATINUM FINESSE;  Surgeon: [name], MD;  Location: [place];  Service: Vascular;  Laterality: Right;  ? ? ?There were no vitals filed for this visit. ? ? ? Subjective Assessment - 10/03/21 1041   ? ? Subjective Arrived with dressing intact, no reports of pain at entrance.  Did take a pain pill before session today.   ? Currently in Pain? No/denies   ? ?  ?  ? ?  ? ? ? ? ? ? ? ? ? ? ? ?  Wound Therapy - 10/03/21 0001   ? ? Subjective Arrived with dressing intact, no reports of pain at entrance.  Did take a pain pill before session today.   ? Patient and Family Stated Goals wound to heal   ? Date of Onset 09/05/21   ? Prior Treatments ER on 4/14; I&D on 4/21; stitches taken out on 5/1   ? Pain Scale 0-10   ? Pain Score 0-No pain   ? Evaluation and Treatment Procedures Explained to Patient/Family Yes   ? Evaluation and Treatment Procedures agreed to   ? Incision Properties Date First Assessed: 09/12/21 Time First Assessed: 1202 Location: Finger (Comment which one) Location Orientation: Right  ? Dressing Type Impregnated gauze  (petrolatum);Gauze (Comment)   medihoney, xeroform, gauze and coban  ? Dressing Old drainage (marked)   ? Dressing Change Frequency PRN   ? Incision Length (cm) --   Granulation 25%  ? Closure None   ? Drainage Amount Minimal   ? Drainage Description Serous   ? Treatment Cleansed;Other (Comment)   debridement  ? Wound Therapy - Clinical Statement Selective debridement for removal of devitalized tissue to promote healing.  Finger less twitchy this session but does continue to have constant movement, caution with debridement.  Continued with medihoney, xerform, gauze and coban.   ? Factors Delaying/Impairing Wound Healing Diabetes Mellitus;Infection - systemic/local;Multiple medical problems   ? Hydrotherapy Plan Debridement;Dressing change;Patient/family education   ? Wound Therapy - Frequency 2X / week   ? Wound Therapy - Current Recommendations PT   ? Wound Plan continue cleansing, debridement and appropriate dressing   ? Dressing  vaseline, medihoney, xeroform, gauze and coban   ? ?  ?  ? ?  ? ? ? ? ? ? ? ? ? ? ? ? PT Short Term Goals - 09/25/21 1224   ? ?  ? PT SHORT TERM GOAL #1  ? Title PT wound to be 100% granulated   ? Time 3   ? Period Weeks   ? Status New   ? Target Date 10/16/21   ?  ? PT SHORT TERM GOAL #2  ? Title PT pain to be no greater than a 5/10 with treatment   ? Time 3   ? Period Weeks   ? Status New   ? ?  ?  ? ?  ? ? ? ? PT Long Term Goals - 09/25/21 1225   ? ?  ? PT LONG TERM GOAL #1  ? Title PT wound to be healed   ? Time 6   ? Period Weeks   ? Status New   ? Target Date 11/06/21   ?  ? PT LONG TERM GOAL #2  ? Title Pt pain to be no greater than a 2/10 for middle finger.   ? Time 6   ? Period Weeks   ? ?  ?  ? ?  ? ? ? ? ? ? ? ? ? ?Patient will benefit from skilled therapeutic intervention in order to improve the following deficits and impairments:    ? ?Visit Diagnosis: ?Amputation of tip of finger, subsequent encounter ? ? ? ? ?Problem List ?Patient Active Problem List  ? Diagnosis Date  Noted  ? Amputation, finger, traumatic   ? Hypertensive emergency 05/30/2020  ? Hypertensive encephalopathy 05/30/2020  ? Asymptomatic carotid artery stenosis without infarction, right 12/29/2019  ? Pancreatic mass   ? Diabetes ([place]) 11/29/2017  ? Essential hypertension 11/29/2017  ? Dyslipidemia 11/29/2017  ?  Myocardial infarction ([place]) 11/29/2017  ? BPH (benign prostatic hyperplasia) 11/29/2017  ? SBO (small bowel obstruction) ([place]) 11/29/2017  ? CKD (chronic kidney disease), stage III ([place]) 11/29/2017  ? Hx of colonic polyps   ? History of colonic polyps 10/10/2009  ? ?Ihor Austin, LPTA/CLT; CBIS ?[phone] ? ?Aldona Lento, PTA ?10/03/2021, 1:23 PM ? ?[facility] ?[place] ?[address] ?[place], [place], [address] ?Phone: [phone]   Fax:  [phone] ? ?Name: Jonathan Avery ?MRN: [id number] ?Date of Birth: 08/06/1930 ? ? ? ? ?

## 2021-10-06 ENCOUNTER — Ambulatory Visit (HOSPITAL_COMMUNITY): Payer: Medicare HMO | Admitting: Physical Therapy

## 2021-10-06 DIAGNOSIS — Z4789 Encounter for other orthopedic aftercare: Secondary | ICD-10-CM | POA: Diagnosis not present

## 2021-10-06 DIAGNOSIS — S68119D Complete traumatic metacarpophalangeal amputation of unspecified finger, subsequent encounter: Secondary | ICD-10-CM | POA: Diagnosis not present

## 2021-10-06 NOTE — Therapy (Signed)
[place] ?[place] ?[address] ?[place], [place], [address] ?Phone: [phone]   Fax:  [phone] ? ?Wound Care Therapy ? ?Patient Details  ?Name: Jonathan Avery ?MRN: [id number] ?Date of Birth: 07-Aug-1930 ?Referring Provider (PT): Arther Abbott ? ? ?Encounter Date: 10/06/2021 ? ? PT End of Session - 10/06/21 1518   ? ? Visit Number 4   ? Number of Visits 12   ? Date for PT Re-Evaluation 11/06/21   ? Authorization Time Period [employer] medicare- requested 12 visits at eval   ? Progress Note Due on Visit 12   ? PT Start Time 1420   ? PT Stop Time 8588   ? PT Time Calculation (min) 25 min   ? Activity Tolerance Patient tolerated treatment well   ? Behavior During Therapy [facility] for tasks assessed/performed   ? ?  ?  ? ?  ? ? ?Past Medical History:  ?Diagnosis Date  ? BPH (benign prostatic hypertrophy)   ? Cancer [facility])   ? skin cancer - basal  ? Carotid artery occlusion   ? Diabetes ([place])   ? Type II  ? High cholesterol   ? Hypertension   ? Myocardial infarction [facility])   ? Peripheral vascular disease ([place])   ? Stroke [facility])   ?   documented 12/28/2019- ? TIA  " weeks 3 weeks ago, expresive aphasia lasted 2 -3 minutes."   ? ? ?Past Surgical History:  ?Procedure Laterality Date  ? AMPUTATION Right 09/12/2021  ? Procedure: AMPUTATION DIGIT;  Surgeon: Carole Civil, MD;  [facility] OR[facility];  Service: Orthopedics;  Laterality: Right;  ? CARDIAC CATHETERIZATION    ? CHOLECYSTECTOMY  1973  ? COLONOSCOPY  07/20/2006  ? Dr. [name]:Status post right hemicolectomy. Residual colonic mucosa appeared normal, normal rectum.   ? COLONOSCOPY  2006/2007  ? sprawling villous adenoma at ileocecal valve  ? COLONOSCOPY  2011  ? Dr. [name]: normal rectum, pancolonic diverticulosis, 2 diminutive polyps, with path benign polypoid colonic mucosa  ? COLONOSCOPY N/A 01/02/2015  ? Procedure: COLONOSCOPY;  Surgeon: Daneil Dolin, MD;  [facility];  Service: Endoscopy;  Laterality: N/A;  1245  ? CORONARY  ANGIOPLASTY    ? ENDARTERECTOMY Right 12/29/2019  ? Procedure: ENDARTERECTOMY CAROTID;  Surgeon: [name], MD;  Location: [facility] OR;  Service: Vascular;  Laterality: Right;  ? ESOPHAGOGASTRODUODENOSCOPY (EGD) WITH PROPOFOL N/A 01/13/2018  ? Procedure: ESOPHAGOGASTRODUODENOSCOPY (EGD) WITH PROPOFOL;  Surgeon: [name], MD;  [facility];  Service: Endoscopy;  Laterality: N/A;  ? EUS N/A 01/13/2018  ? Procedure: UPPER ENDOSCOPIC ULTRASOUND (EUS) RADIAL;  Surgeon: [name], MD;  [facility];  Service: Endoscopy;  Laterality: N/A;  ? FINE NEEDLE ASPIRATION N/A 01/13/2018  ? Procedure: FINE NEEDLE ASPIRATION (FNA) LINEAR;  Surgeon: [name], MD;  [facility];  Service: Endoscopy;  Laterality: N/A;  ? open hemicolectomy  2007  ? due to villous adenoma  ? PATCH ANGIOPLASTY Right 12/29/2019  ? Procedure: RIGHT CAROTID PATCH ANGIOPLASTY USING HEMASHIELD PLATINUM FINESSE;  Surgeon: [name], MD;  Location: [place];  Service: Vascular;  Laterality: Right;  ? ? ?There were no vitals filed for this visit. ? ? ? ? ? ? ? ? ? ? ? ? ? ? Wound Therapy - 10/06/21 0001   ? ? Subjective Pt reports no pain or issues. Dressing remains intact.   ? Patient and Family Stated Goals wound to heal   ?  Date of Onset 09/05/21   ? Prior Treatments ER on 4/14; I&D on 4/21; stitches taken out on 5/1   ? Pain Scale 0-10   ? Pain Score 0-No pain   ? Evaluation and Treatment Procedures Explained to Patient/Family Yes   ? Evaluation and Treatment Procedures agreed to   ? Incision Properties Date First Assessed: 09/12/21 Time First Assessed: 1202 Location: Finger (Comment which one) Location Orientation: Right  ? Dressing Type Impregnated gauze (petrolatum);Gauze (Comment)   medihoney in wound, xerform around  ? Dressing Old drainage (marked)   ? Dressing Change Frequency PRN   ? Closure None   ? Drainage Amount Minimal   ? Drainage Description Serous   ? Treatment Cleansed   debridement  ? Wound  Therapy - Clinical Statement Able to debride alot of slough from central wound and devitiatlized tissue perimeter of wound.  Clenased well.  Unsure of depth at this point with some slough remaining, approx 50/50% granulation to slough.  Continued with medihoney gauze packed into woundbed and xerform perimeter as remains dry.  2" conform and coban over this with #1 netting.   ? Factors Delaying/Impairing Wound Healing Diabetes Mellitus;Infection - systemic/local;Multiple medical problems   ? Hydrotherapy Plan Debridement;Dressing change;Patient/family education   ? Wound Therapy - Frequency 2X / week   ? Wound Therapy - Current Recommendations PT   ? Wound Plan continue cleansing, debridement and appropriate dressing.  Photograph and measure thurs/fri.   ? Dressing  vaseline perimeter and xeroform, medihoney into wound bed, 2" conform, 1 inch coban and #1 netting.   ? ?  ?  ? ?  ? ? ? ? ? ? ? ? ? ? ? ? PT Short Term Goals - 09/25/21 1224   ? ?  ? PT SHORT TERM GOAL #1  ? Title PT wound to be 100% granulated   ? Time 3   ? Period Weeks   ? Status New   ? Target Date 10/16/21   ?  ? PT SHORT TERM GOAL #2  ? Title PT pain to be no greater than a 5/10 with treatment   ? Time 3   ? Period Weeks   ? Status New   ? ?  ?  ? ?  ? ? ? ? PT Long Term Goals - 09/25/21 1225   ? ?  ? PT LONG TERM GOAL #1  ? Title PT wound to be healed   ? Time 6   ? Period Weeks   ? Status New   ? Target Date 11/06/21   ?  ? PT LONG TERM GOAL #2  ? Title Pt pain to be no greater than a 2/10 for middle finger.   ? Time 6   ? Period Weeks   ? ?  ?  ? ?  ? ? ? ? ? ? ? ? ? ?Patient will benefit from skilled therapeutic intervention in order to improve the following deficits and impairments:    ? ?Visit Diagnosis: ?Amputation of tip of finger, subsequent encounter ? ? ? ? ?Problem List ?Patient Active Problem List  ? Diagnosis Date Noted  ? Amputation, finger, traumatic   ? Hypertensive emergency 05/30/2020  ? Hypertensive encephalopathy 05/30/2020  ?  Asymptomatic carotid artery stenosis without infarction, right 12/29/2019  ? Pancreatic mass   ? Diabetes ([place]) 11/29/2017  ? Essential hypertension 11/29/2017  ? Dyslipidemia 11/29/2017  ? Myocardial infarction ([place]) 11/29/2017  ? BPH (benign prostatic hyperplasia) 11/29/2017  ? SBO (  small bowel obstruction) ([place]) 11/29/2017  ? CKD (chronic kidney disease), stage III ([place]) 11/29/2017  ? Hx of colonic polyps   ? History of colonic polyps 10/10/2009  ? ?July Nickson Sula Soda, PTA/CLT, WTA ?[phone] ? ?Roseanne Reno B, PTA ?10/06/2021, 3:23 PM ? ?[place] ?[place] ?[address] ?[place], [place], [address] ?Phone: [phone]   Fax:  [phone] ? ?Name: AXYL SITZMAN ?MRN: [id number] ?Date of Birth: July 15, 1930 ? ? ? ? ?

## 2021-10-08 ENCOUNTER — Encounter (HOSPITAL_COMMUNITY): Payer: Self-pay | Admitting: Physical Therapy

## 2021-10-08 ENCOUNTER — Ambulatory Visit (HOSPITAL_COMMUNITY): Payer: Medicare HMO | Admitting: Physical Therapy

## 2021-10-08 DIAGNOSIS — Z79899 Other long term (current) drug therapy: Secondary | ICD-10-CM | POA: Diagnosis not present

## 2021-10-08 DIAGNOSIS — N1832 Chronic kidney disease, stage 3b: Secondary | ICD-10-CM | POA: Diagnosis not present

## 2021-10-08 DIAGNOSIS — S68119D Complete traumatic metacarpophalangeal amputation of unspecified finger, subsequent encounter: Secondary | ICD-10-CM

## 2021-10-08 DIAGNOSIS — E1129 Type 2 diabetes mellitus with other diabetic kidney complication: Secondary | ICD-10-CM | POA: Diagnosis not present

## 2021-10-08 DIAGNOSIS — I2510 Atherosclerotic heart disease of native coronary artery without angina pectoris: Secondary | ICD-10-CM | POA: Diagnosis not present

## 2021-10-08 DIAGNOSIS — Z4789 Encounter for other orthopedic aftercare: Secondary | ICD-10-CM | POA: Diagnosis not present

## 2021-10-08 DIAGNOSIS — I10 Essential (primary) hypertension: Secondary | ICD-10-CM | POA: Diagnosis not present

## 2021-10-08 NOTE — Therapy (Signed)
[place] ?[place] ?[address] ?[place], [place], [address] ?Phone: [phone]   Fax:  [phone] ? ?Wound Care Therapy ? ?Patient Details  ?Name: Jonathan Avery ?MRN: [id number] ?Date of Birth: 18-Aug-1930 ?Referring Provider (PT): Arther Abbott ? ? ?Encounter Date: 10/08/2021 ? ? PT End of Session - 10/08/21 1132   ? ? Visit Number 5   ? Number of Visits 12   ? Date for PT Re-Evaluation 11/06/21   ? Authorization Time Period [employer] medicare- requested 12 visits at eval   ? Progress Note Due on Visit 12   ? PT Start Time 1139   ? PT Stop Time 1202   ? PT Time Calculation (min) 23 min   ? Activity Tolerance Patient tolerated treatment well   ? Behavior During Therapy [facility] for tasks assessed/performed   ? ?  ?  ? ?  ? ? ?Past Medical History:  ?Diagnosis Date  ? BPH (benign prostatic hypertrophy)   ? Cancer [facility])   ? skin cancer - basal  ? Carotid artery occlusion   ? Diabetes ([place])   ? Type II  ? High cholesterol   ? Hypertension   ? Myocardial infarction [facility])   ? Peripheral vascular disease ([place])   ? Stroke [facility])   ?   documented 12/28/2019- ? TIA  " weeks 3 weeks ago, expresive aphasia lasted 2 -3 minutes."   ? ? ?Past Surgical History:  ?Procedure Laterality Date  ? AMPUTATION Right 09/12/2021  ? Procedure: AMPUTATION DIGIT;  Surgeon: Carole Civil, MD;  [facility];  Service: Orthopedics;  Laterality: Right;  ? CARDIAC CATHETERIZATION    ? CHOLECYSTECTOMY  1973  ? COLONOSCOPY  07/20/2006  ? Dr. [name]:Status post right hemicolectomy. Residual colonic mucosa appeared normal, normal rectum.   ? COLONOSCOPY  2006/2007  ? sprawling villous adenoma at ileocecal valve  ? COLONOSCOPY  2011  ? Dr. [name]: normal rectum, pancolonic diverticulosis, 2 diminutive polyps, with path benign polypoid colonic mucosa  ? COLONOSCOPY N/A 01/02/2015  ? Procedure: COLONOSCOPY;  Surgeon: Daneil Dolin, MD;  [facility];  Service: Endoscopy;  Laterality: N/A;  1245  ? CORONARY  ANGIOPLASTY    ? ENDARTERECTOMY Right 12/29/2019  ? Procedure: ENDARTERECTOMY CAROTID;  Surgeon: [name], MD;  Location: [facility] OR;  Service: Vascular;  Laterality: Right;  ? ESOPHAGOGASTRODUODENOSCOPY (EGD) WITH PROPOFOL N/A 01/13/2018  ? Procedure: ESOPHAGOGASTRODUODENOSCOPY (EGD) WITH PROPOFOL;  Surgeon: [name], MD;  [facility];  Service: Endoscopy;  Laterality: N/A;  ? EUS N/A 01/13/2018  ? Procedure: UPPER ENDOSCOPIC ULTRASOUND (EUS) RADIAL;  Surgeon: [name], MD;  [facility];  Service: Endoscopy;  Laterality: N/A;  ? FINE NEEDLE ASPIRATION N/A 01/13/2018  ? Procedure: FINE NEEDLE ASPIRATION (FNA) LINEAR;  Surgeon: [name], MD;  [facility];  Service: Endoscopy;  Laterality: N/A;  ? open hemicolectomy  2007  ? due to villous adenoma  ? PATCH ANGIOPLASTY Right 12/29/2019  ? Procedure: RIGHT CAROTID PATCH ANGIOPLASTY USING HEMASHIELD PLATINUM FINESSE;  Surgeon: [name], MD;  Location: [place];  Service: Vascular;  Laterality: Right;  ? ? ?There were no vitals filed for this visit. ? ? ? ? ? ? ? ? ? ? ? ? ? ? Wound Therapy - 10/08/21 0001   ? ? Subjective [name] feels alright   ? Patient and Family Stated Goals wound to heal   ? Date of Onset 09/05/21   ?  Prior Treatments ER on 4/14; I&D on 4/21; stitches taken out on 5/1   ? Evaluation and Treatment Procedures Explained to Patient/Family Yes   ? Evaluation and Treatment Procedures agreed to   ? Incision Properties Date First Assessed: 09/12/21 Time First Assessed: 1202 Location: Finger (Comment which one) Location Orientation: Right  ? Dressing Type Impregnated gauze (petrolatum);Gauze (Comment)   medihoney in wound, xerform around  ? Dressing Old drainage (marked)   ? Dressing Change Frequency PRN   ? Closure None   ? Drainage Amount Scant   ? Drainage Description Serous   ? Treatment Cleansed   debrided  ? Wound Therapy - Clinical Statement Cleansed and debrided wound. Continued slough in wound bed  which remains very adherent. Patient with area of redness/bruizing proximally and laterally but states he hasnt bumped it on anything. Dressed with medihoney on gauze to wound bed with xeroform surrounding. wrapped conform and coban with netting.   ? Factors Delaying/Impairing Wound Healing Diabetes Mellitus;Infection - systemic/local;Multiple medical problems   ? Hydrotherapy Plan Debridement;Dressing change;Patient/family education   ? Wound Therapy - Frequency 2X / week   ? Wound Therapy - Current Recommendations PT   ? Wound Plan continue cleansing, debridement and appropriate dressing.  Photograph and measure thurs/fri.   ? Dressing  vaseline perimeter and xeroform, medihoney into wound bed, 2" conform, 1 inch coban and #1 netting.   ? ?  ?  ? ?  ? ? ? ? ? ? ? ? ? ? ? ? PT Short Term Goals - 10/08/21 1133   ? ?  ? PT SHORT TERM GOAL #1  ? Title PT wound to be 100% granulated   ? Time 3   ? Period Weeks   ? Status On-going   ? Target Date 10/16/21   ?  ? PT SHORT TERM GOAL #2  ? Title PT pain to be no greater than a 5/10 with treatment   ? Time 3   ? Period Weeks   ? Status On-going   ? ?  ?  ? ?  ? ? ? ? PT Long Term Goals - 10/08/21 1133   ? ?  ? PT LONG TERM GOAL #1  ? Title PT wound to be healed   ? Time 6   ? Period Weeks   ? Status On-going   ? Target Date 11/06/21   ?  ? PT LONG TERM GOAL #2  ? Title Pt pain to be no greater than a 2/10 for middle finger.   ? Time 6   ? Period Weeks   ? Status On-going   ? ?  ?  ? ?  ? ? ? ? ? ? ? ? ? ?Patient will benefit from skilled therapeutic intervention in order to improve the following deficits and impairments:    ? ?Visit Diagnosis: ?Amputation of tip of finger, subsequent encounter ? ? ? ? ?Problem List ?Patient Active Problem List  ? Diagnosis Date Noted  ? Amputation, finger, traumatic   ? Hypertensive emergency 05/30/2020  ? Hypertensive encephalopathy 05/30/2020  ? Asymptomatic carotid artery stenosis without infarction, right 12/29/2019  ? Pancreatic mass    ? Diabetes ([place]) 11/29/2017  ? Essential hypertension 11/29/2017  ? Dyslipidemia 11/29/2017  ? Myocardial infarction ([place]) 11/29/2017  ? BPH (benign prostatic hyperplasia) 11/29/2017  ? SBO (small bowel obstruction) ([place]) 11/29/2017  ? CKD (chronic kidney disease), stage III ([place]) 11/29/2017  ? Hx of colonic polyps   ? History of colonic  polyps 10/10/2009  ? ? ?Mearl Latin, PT ?10/08/2021, 12:15 PM ? ?[place] ?[place] ?[address] ?[place], [place], [address] ?Phone: [phone]   Fax:  [phone] ? ?Name: Jonathan Avery ?MRN: [id number] ?Date of Birth: [birth year] ? ? ? ? ?

## 2021-10-14 ENCOUNTER — Ambulatory Visit (HOSPITAL_COMMUNITY): Payer: Medicare HMO | Admitting: Physical Therapy

## 2021-10-14 DIAGNOSIS — Z4789 Encounter for other orthopedic aftercare: Secondary | ICD-10-CM | POA: Diagnosis not present

## 2021-10-14 DIAGNOSIS — S68119D Complete traumatic metacarpophalangeal amputation of unspecified finger, subsequent encounter: Secondary | ICD-10-CM

## 2021-10-14 NOTE — Therapy (Signed)
[name], [place], [address] Phone: [phone]   Fax:  [phone]  Wound Care Therapy  Patient Details  Name: Jonathan Avery MRN: [id number] Date of Birth: 1930/12/12 Referring Provider (PT): Arther Abbott   Encounter Date: 10/14/2021   PT End of Session - 10/14/21 0950     Visit Number 6    Number of Visits 12    Date for PT Re-Evaluation 11/06/21    Authorization Time Period [employer] medicare- requested 12 visits at eval    Progress Note Due on Visit 12    PT Start Time 0920    PT Stop Time 0943    PT Time Calculation (min) 23 min    Activity Tolerance Patient tolerated treatment well    Behavior During Therapy [facility] for tasks assessed/performed             Past Medical History:  Diagnosis Date   BPH (benign prostatic hypertrophy)    Cancer ([facility])    skin cancer - basal   Carotid artery occlusion    Diabetes ([facility])    Type II   High cholesterol    Hypertension    Myocardial infarction [facility])    Peripheral vascular disease ([place])    Stroke ([place])      documented 12/28/2019- ? TIA  " weeks 3 weeks ago, expresive aphasia lasted 2 -3 minutes."     Past Surgical History:  Procedure Laterality Date   AMPUTATION Right 09/12/2021   Procedure: AMPUTATION DIGIT;  Surgeon: Carole Civil, MD;  [facility];  Service: Orthopedics;  Laterality: Right;   [place]   COLONOSCOPY  07/20/2006   Dr. [name]:Status post right hemicolectomy. Residual colonic mucosa appeared normal, normal rectum.    COLONOSCOPY  2006/2007   sprawling villous adenoma at ileocecal valve   COLONOSCOPY  2011   Dr. [name]: normal rectum, pancolonic diverticulosis, 2 diminutive polyps, with path benign polypoid colonic mucosa   COLONOSCOPY N/A 01/02/2015   Procedure: COLONOSCOPY;  Surgeon: Daneil Dolin, MD;  [facility];  Service: Endoscopy;  Laterality: N/A;  1245   CORONARY  ANGIOPLASTY     ENDARTERECTOMY Right 12/29/2019   Procedure: ENDARTERECTOMY CAROTID;  Surgeon: [name], MD;  Location: [facility] OR;  Service: Vascular;  Laterality: Right;   ESOPHAGOGASTRODUODENOSCOPY (EGD) WITH PROPOFOL N/A 01/13/2018   Procedure: ESOPHAGOGASTRODUODENOSCOPY (EGD) WITH PROPOFOL;  Surgeon: [name], MD;  [facility];  Service: Endoscopy;  Laterality: N/A;   EUS N/A 01/13/2018   Procedure: UPPER ENDOSCOPIC ULTRASOUND (EUS) RADIAL;  Surgeon: [name], MD;  [facility];  Service: Endoscopy;  Laterality: N/A;   FINE NEEDLE ASPIRATION N/A 01/13/2018   Procedure: FINE NEEDLE ASPIRATION (FNA) LINEAR;  Surgeon: [name], MD;  [facility];  Service: Endoscopy;  Laterality: N/A;   open hemicolectomy  2007   due to villous adenoma   PATCH ANGIOPLASTY Right 12/29/2019   Procedure: RIGHT CAROTID PATCH ANGIOPLASTY USING HEMASHIELD PLATINUM FINESSE;  Surgeon: [name], MD;  Location: [place];  Service: Vascular;  Laterality: Right;    There were no vitals filed for this visit.               Wound Therapy - 10/14/21 0957     Subjective States he returns to MD this week; not as sensitive    Patient and Family Stated Goals wound to heal  Date of Onset 09/05/21    Prior Treatments ER on 4/14; I&D on 4/21; stitches taken out on 5/1    Pain Scale 0-10    Pain Score 0-No pain    Evaluation and Treatment Procedures Explained to Patient/Family Yes    Evaluation and Treatment Procedures agreed to    Incision Properties Date First Assessed: 09/12/21 Time First Assessed: 1202 Location: Finger (Comment which one) Location Orientation: Right   Dressing Type Impregnated gauze (petrolatum);Gauze (Comment)   medihoney in wound, xerform around   Dressing Old drainage (marked)    Dressing Change Frequency PRN    Site / Wound Assessment Pale;Pink   callous perimeter   Incision Length (cm) 1 cm   wound is 1X0.3, was 2.2X1.2 at eval on  5/4.  wound is 25% granulated, 75% slough   Closure None    Drainage Amount Scant    Drainage Description Serous    Treatment Cleansed   debridement   Wound Therapy - Clinical Statement Wound photographed and measured this session.  Reduction in size from 2.2X1.2cm to 1X0.3 cm.  Granulation remains 25% with callous around wound.  Cleansed and debrided wound. Able to debride more slough form wound bed, unsure of actual depth at this point. No redness/bruizing present proximally and laterally as was before.  States he's been trying to protect it from getting bumped on anything. Dressed with medihoney on gauze slightly packed into wound bed with vaseline applied to perimeter. Wrapped conform and coban with netting.    Factors Delaying/Impairing Wound Healing Diabetes Mellitus;Infection - systemic/local;Multiple medical problems    Hydrotherapy Plan Debridement;Dressing change;Patient/family education    Wound Therapy - Frequency 2X / week    Wound Therapy - Current Recommendations PT    Wound Plan continue cleansing, debridement and appropriate dressing.  Photograph and measure thurs/fri.    Dressing  vaseline perimeter, medihoney into wound bed on slither of gauze, 2" conform, 1 inch coban and #1 netting.                       PT Short Term Goals - 10/08/21 1133       PT SHORT TERM GOAL #1   Title PT wound to be 100% granulated    Time 3    Period Weeks    Status On-going    Target Date 10/16/21      PT SHORT TERM GOAL #2   Title PT pain to be no greater than a 5/10 with treatment    Time 3    Period Weeks    Status On-going               PT Long Term Goals - 10/08/21 1133       PT LONG TERM GOAL #1   Title PT wound to be healed    Time 6    Period Weeks    Status On-going    Target Date 11/06/21      PT LONG TERM GOAL #2   Title Pt pain to be no greater than a 2/10 for middle finger.    Time 6    Period Weeks    Status On-going                     Patient will benefit from skilled therapeutic intervention in order to improve the following deficits and impairments:     Visit Diagnosis: Amputation of tip of finger, subsequent encounter     Problem  List Patient Active Problem List   Diagnosis Date Noted   Amputation, finger, traumatic    Hypertensive emergency 05/30/2020   Hypertensive encephalopathy 05/30/2020   Asymptomatic carotid artery stenosis without infarction, right 12/29/2019   Pancreatic mass    Diabetes ([place]) 11/29/2017   Essential hypertension 11/29/2017   Dyslipidemia 11/29/2017   Myocardial infarction ([place]) 11/29/2017   BPH (benign prostatic hyperplasia) 11/29/2017   SBO (small bowel obstruction) ([place]) 11/29/2017   CKD (chronic kidney disease), stage III ([place]) 11/29/2017   Hx of colonic polyps    History of colonic polyps 10/10/2009   Teena Irani, PTA/CLT [place] Ph: [phone]  Veva Holes 10/14/2021, 10:07 AM  [place] [address] [place], [place], [address] Phone: [phone]   Fax:  [phone]  Name: GRAHM ETSITTY MRN: [id number] Date of Birth: 10-09-1930

## 2021-10-15 ENCOUNTER — Ambulatory Visit (INDEPENDENT_AMBULATORY_CARE_PROVIDER_SITE_OTHER): Payer: Medicare HMO | Admitting: Orthopedic Surgery

## 2021-10-15 ENCOUNTER — Telehealth (HOSPITAL_COMMUNITY): Payer: Self-pay | Admitting: Physical Therapy

## 2021-10-15 DIAGNOSIS — E1122 Type 2 diabetes mellitus with diabetic chronic kidney disease: Secondary | ICD-10-CM | POA: Diagnosis not present

## 2021-10-15 DIAGNOSIS — S68119D Complete traumatic metacarpophalangeal amputation of unspecified finger, subsequent encounter: Secondary | ICD-10-CM

## 2021-10-15 DIAGNOSIS — I10 Essential (primary) hypertension: Secondary | ICD-10-CM | POA: Diagnosis not present

## 2021-10-15 DIAGNOSIS — N1832 Chronic kidney disease, stage 3b: Secondary | ICD-10-CM | POA: Diagnosis not present

## 2021-10-15 DIAGNOSIS — E875 Hyperkalemia: Secondary | ICD-10-CM | POA: Diagnosis not present

## 2021-10-15 NOTE — Patient Instructions (Signed)
THURS OR FRI F/U

## 2021-10-15 NOTE — Progress Notes (Signed)
FOLLOW UP   Encounter Diagnosis  Name Primary?   Amputation of tip of finger, subsequent encounter Yes     Chief Complaint  Patient presents with   Routine Post Op    DOS 09/12/21 Right ring finger      Right ring finger continues to improve he is getting honey dressing and looks good I worked on his range of motion gave him some exercises to do he can bend the MP joint and IP joint just the DIP joint is a little stiff  Patient will do his own dressing soon follow-up with me in 2 weeks Thursday or Friday

## 2021-10-15 NOTE — Telephone Encounter (Signed)
Pt phoned and stated per Dr. Aline Brochure, Cx all future appts and requested Pt be discharged from our care. Pt stated he no longer needs our care.

## 2021-10-16 ENCOUNTER — Ambulatory Visit (HOSPITAL_COMMUNITY): Payer: Medicare HMO | Admitting: Physical Therapy

## 2021-10-21 ENCOUNTER — Ambulatory Visit (HOSPITAL_COMMUNITY): Payer: Medicare HMO | Admitting: Physical Therapy

## 2021-10-23 ENCOUNTER — Ambulatory Visit (HOSPITAL_COMMUNITY): Payer: Medicare HMO | Admitting: Physical Therapy

## 2021-10-23 ENCOUNTER — Encounter (HOSPITAL_COMMUNITY): Payer: Self-pay | Admitting: Physical Therapy

## 2021-10-23 ENCOUNTER — Ambulatory Visit (HOSPITAL_COMMUNITY): Payer: Medicare HMO | Attending: Orthopedic Surgery | Admitting: Physical Therapy

## 2021-10-23 DIAGNOSIS — S68119D Complete traumatic metacarpophalangeal amputation of unspecified finger, subsequent encounter: Secondary | ICD-10-CM

## 2021-10-23 NOTE — Therapy (Signed)
[place], [place], [address] Phone: [phone]   Fax:  [phone]  Wound Care Therapy  Patient Details  Name: Jonathan Avery MRN: [id number] Date of Birth: [birth year] Referring Provider (PT): Arther Abbott   Encounter Date: 10/23/2021  PHYSICAL THERAPY DISCHARGE SUMMARY  Visits from Start of Care: 7  Current functional level related to goals / functional outcomes: See below   Remaining deficits: See below   Education / Equipment: See below   Patient agrees to discharge. Patient goals were partially met. Patient is being discharged due to being pleased with the current functional level.    PT End of Session - 10/23/21 0810     Visit Number 7    Number of Visits 12    Date for PT Re-Evaluation 11/06/21    Authorization Time Period [employer] medicare- requested 12 visits at eval    Progress Note Due on Visit 12    PT Start Time 0745    PT Stop Time 0806    PT Time Calculation (min) 21 min    Activity Tolerance Patient tolerated treatment well    Behavior During Therapy [facility] for tasks assessed/performed             Past Medical History:  Diagnosis Date   BPH (benign prostatic hypertrophy)    Cancer (HCC)    skin cancer - basal   Carotid artery occlusion    Diabetes (HCC)    Type II   High cholesterol    Hypertension    Myocardial infarction [facility])    Peripheral vascular disease ([place])    Stroke ([place])      documented 12/28/2019- ? TIA  " weeks 3 weeks ago, expresive aphasia lasted 2 -3 minutes."     Past Surgical History:  Procedure Laterality Date   AMPUTATION Right 09/12/2021   Procedure: AMPUTATION DIGIT;  Surgeon: Carole Civil, MD;  Location: [facility] ORS;  Service: Orthopedics;  Laterality: Right;   [place]   COLONOSCOPY  07/20/2006   Dr. [name]:Status post right hemicolectomy. Residual colonic mucosa appeared normal, normal rectum.     COLONOSCOPY  2006/2007   sprawling villous adenoma at ileocecal valve   COLONOSCOPY  2011   Dr. [name]: normal rectum, pancolonic diverticulosis, 2 diminutive polyps, with path benign polypoid colonic mucosa   COLONOSCOPY N/A 01/02/2015   Procedure: COLONOSCOPY;  Surgeon: Daneil Dolin, MD;  Location[facility];  Service: Endoscopy;  Laterality: N/A;  1245   CORONARY ANGIOPLASTY     ENDARTERECTOMY Right 12/29/2019   Procedure: ENDARTERECTOMY CAROTID;  Surgeon: [name], MD;  Location: [facility] OR;  Service: Vascular;  Laterality: Right;   ESOPHAGOGASTRODUODENOSCOPY (EGD) WITH PROPOFOL N/A 01/13/2018   Procedure: ESOPHAGOGASTRODUODENOSCOPY (EGD) WITH PROPOFOL;  Surgeon: [name], MD;  [facility];  Service: Endoscopy;  Laterality: N/A;   EUS N/A 01/13/2018   Procedure: UPPER ENDOSCOPIC ULTRASOUND (EUS) RADIAL;  Surgeon: [name], MD;  [facility]: [facility] END[facility];  Service: Endoscopy;  Laterality: N/A;   FINE NEEDLE ASPIRATION N/A 01/13/2018   Procedure: FINE NEEDLE ASPIRATION (FNA) LINEAR;  Surgeon: [name], MD;  [facility] END[facility];  Service: Endoscopy;  Laterality: N/A;   open hemicolectomy  2007   due to villous adenoma   PATCH ANGIOPLASTY Right 12/29/2019   Procedure: RIGHT CAROTID PATCH ANGIOPLASTY USING HEMASHIELD PLATINUM FINESSE;  Surgeon: [name], MD;  Location: [facility];  Service:  Vascular;  Laterality: Right;    There were no vitals filed for this visit.      [facility] PT Assessment - 10/23/21 0001       Assessment   Medical Diagnosis Traumatic amputation of Rt middle finger    Referring Provider (PT) Arther Abbott    Onset Date/Surgical Date 09/05/21    Hand Dominance Right                     Wound Therapy - 10/23/21 0001     Subjective Patient arrives with caregiver. they stated they went to MD last week and he said they could transition to self care. They cancelled remaining appointments from PT wound care. They then called  back earlier this week to make another appointment to make sure it was doing alright.    Patient and Family Stated Goals wound to heal    Date of Onset 09/05/21    Prior Treatments ER on 4/14; I&D on 4/21; stitches taken out on 5/1    Pain Score 0-No pain    Evaluation and Treatment Procedures Explained to Patient/Family Yes    Evaluation and Treatment Procedures agreed to    Incision Properties Date First Assessed: 09/12/21 Time First Assessed: 1202 Location: Finger (Comment which one) Location Orientation: Right   Dressing Type Gauze (Comment)   band aid   Dressing Old drainage (marked)    Dressing Change Frequency PRN    Site / Wound Assessment Pink;Red   callous perimeter   Closure None    Drainage Amount Scant    Drainage Description Serous    Treatment Cleansed;Other (Comment)   debrided   Wound Therapy - Clinical Statement Patient has met 2/2 short term goals and 1/2 long term goals with wound improving in granulation and decreased symptoms. Remaning goal not met due to continued wound present. Patient with continued callus around wound and some slough in wound bed. Cleansed and debrided all slough from wound bed. Dressed with medihoney, 2x2, 2" conform, 1 inch coban, #1 netting. Patient and caregiver want to continue self care of wound and agree to discharge. Educated them on keeping wound clean and changing dressing, told them to get new referral if needed. Patient discharged from PT at this time.    Factors Delaying/Impairing Wound Healing Diabetes Mellitus;Infection - systemic/local;Multiple medical problems    Hydrotherapy Plan Debridement;Dressing change;Patient/family education    Wound Therapy - Current Recommendations PT    Wound Plan n/a    Dressing  vaseline perimeter, medihoney into wound bed on slither of gauze, 2" conform, 1 inch coban and #1 netting.                       PT Short Term Goals - 10/23/21 0811       PT SHORT TERM GOAL #1   Title PT wound  to be 100% granulated    Time 3    Period Weeks    Status Achieved    Target Date 10/16/21      PT SHORT TERM GOAL #2   Title PT pain to be no greater than a 5/10 with treatment    Time 3    Period Weeks    Status Achieved               PT Long Term Goals - 10/23/21 2130       PT LONG TERM GOAL #1   Title PT wound to be healed  Time 6    Period Weeks    Status Not Met    Target Date 11/06/21      PT LONG TERM GOAL #2   Title Pt pain to be no greater than a 2/10 for middle finger.    Time 6    Period Weeks    Status Achieved                    Patient will benefit from skilled therapeutic intervention in order to improve the following deficits and impairments:     Visit Diagnosis: Amputation of tip of finger, subsequent encounter     Problem List Patient Active Problem List   Diagnosis Date Noted   Amputation, finger, traumatic    Hypertensive emergency 05/30/2020   Hypertensive encephalopathy 05/30/2020   Asymptomatic carotid artery stenosis without infarction, right 12/29/2019   Pancreatic mass    Diabetes ([place]) 11/29/2017   Essential hypertension 11/29/2017   Dyslipidemia 11/29/2017   Myocardial infarction ([place]) 11/29/2017   BPH (benign prostatic hyperplasia) 11/29/2017   SBO (small bowel obstruction) ([place]) 11/29/2017   CKD (chronic kidney disease), stage III ([place]) 11/29/2017   Hx of colonic polyps    History of colonic polyps 10/10/2009    Mearl Latin, PT 10/23/2021, 8:21 AM  [facility] [address] [place], [place], [address] Phone: [phone]   Fax:  [phone]  Name: Jonathan Avery MRN: [id number] Date of Birth: 1931-01-22

## 2021-10-28 ENCOUNTER — Ambulatory Visit (HOSPITAL_COMMUNITY): Payer: Medicare HMO

## 2021-10-30 ENCOUNTER — Ambulatory Visit (HOSPITAL_COMMUNITY): Payer: Medicare HMO | Admitting: Physical Therapy

## 2021-10-30 DIAGNOSIS — L570 Actinic keratosis: Secondary | ICD-10-CM | POA: Diagnosis not present

## 2021-10-30 DIAGNOSIS — L820 Inflamed seborrheic keratosis: Secondary | ICD-10-CM | POA: Diagnosis not present

## 2021-10-30 DIAGNOSIS — X32XXXD Exposure to sunlight, subsequent encounter: Secondary | ICD-10-CM | POA: Diagnosis not present

## 2021-10-30 DIAGNOSIS — D225 Melanocytic nevi of trunk: Secondary | ICD-10-CM | POA: Diagnosis not present

## 2021-10-31 ENCOUNTER — Ambulatory Visit (INDEPENDENT_AMBULATORY_CARE_PROVIDER_SITE_OTHER): Payer: Medicare HMO | Admitting: Orthopedic Surgery

## 2021-10-31 ENCOUNTER — Encounter: Payer: Self-pay | Admitting: Orthopedic Surgery

## 2021-10-31 DIAGNOSIS — S68119D Complete traumatic metacarpophalangeal amputation of unspecified finger, subsequent encounter: Secondary | ICD-10-CM

## 2021-10-31 NOTE — Progress Notes (Signed)
FOLLOW UP   Encounter Diagnosis  Name Primary?   Traumatic amputation of finger, subsequent encounter 09/12/21 Yes     Chief Complaint  Patient presents with   Hand Problem    Tip of right middle finger amputation 09/12/21     Doing well  Pics in the chart   Continue honey dressing   Fu 6 weeks

## 2021-11-04 ENCOUNTER — Ambulatory Visit (HOSPITAL_COMMUNITY): Payer: Medicare HMO | Admitting: Physical Therapy

## 2021-11-06 ENCOUNTER — Ambulatory Visit (HOSPITAL_COMMUNITY): Payer: Medicare HMO

## 2021-11-10 ENCOUNTER — Other Ambulatory Visit: Payer: Self-pay | Admitting: *Deleted

## 2021-11-10 DIAGNOSIS — I6529 Occlusion and stenosis of unspecified carotid artery: Secondary | ICD-10-CM

## 2021-11-12 ENCOUNTER — Ambulatory Visit: Payer: Medicare HMO | Admitting: Vascular Surgery

## 2021-11-19 ENCOUNTER — Ambulatory Visit: Payer: Medicare HMO | Admitting: Vascular Surgery

## 2021-11-19 ENCOUNTER — Ambulatory Visit (INDEPENDENT_AMBULATORY_CARE_PROVIDER_SITE_OTHER): Payer: Medicare HMO

## 2021-11-19 ENCOUNTER — Encounter: Payer: Self-pay | Admitting: Vascular Surgery

## 2021-11-19 VITALS — BP 170/76 | HR 66 | Temp 97.9°F | Resp 14 | Ht 70.0 in | Wt 202.8 lb

## 2021-11-19 DIAGNOSIS — I6529 Occlusion and stenosis of unspecified carotid artery: Secondary | ICD-10-CM

## 2021-11-19 NOTE — Progress Notes (Signed)
[facility] [place]  Patient name: Jonathan Avery MRN: [id number] DOB: 1930/09/01 Sex: male  REASON FOR VISIT: Follow-up carotid disease  HPI: Jonathan Avery is a 88 y.o. male here today for follow-up.  He denies any new neurologic changes.  He is status post right carotid endarterectomy by myself in August 2021.  He had an episode a year ago of expressive aphasia and work-up was negative.  He does have known mild to moderate stenosis in his left internal carotid artery.  He has had no new neurologic deficits.  Specifically no aphasia or TIA or stroke.  He remains quite active at 43  Past Medical History:  Diagnosis Date   BPH (benign prostatic hypertrophy)    Cancer (HCC)    skin cancer - basal   Carotid artery occlusion    Diabetes (HCC)    Type II   High cholesterol    Hypertension    Myocardial infarction [facility])    Peripheral vascular disease ([place])    Stroke ([place])      documented 12/28/2019- ? TIA  " weeks 3 weeks ago, expresive aphasia lasted 2 -3 minutes."     Family History  Problem Relation Age of Onset   Ulcers Father    Diabetes type II Brother    Diabetes type II Brother    Colon cancer Neg Hx     SOCIAL HISTORY: Social History   Tobacco Use   Smoking status: Former    Types: Cigars   Smokeless tobacco: Never  Substance Use Topics   Alcohol use: No    Alcohol/week: 0.0 standard drinks of alcohol    No Known Allergies  Current Outpatient Medications  Medication Sig Dispense Refill   amLODipine (NORVASC) 10 MG tablet Take 1 tablet (10 mg total) by mouth daily. 90 tablet 1   aspirin 81 MG tablet Take 81 mg by mouth at bedtime.      Cholecalciferol (VITAMIN D3) 10 MCG (400 UNIT) CAPS Take 800 mg by mouth daily.     cloNIDine (CATAPRES) 0.1 MG tablet Take 1 tablet (0.1 mg total) by mouth 2 (two) times daily. 180 tablet 3   diphenhydramine-acetaminophen (TYLENOL PM) 25-500 MG TABS tablet Take 1 tablet by  mouth at bedtime.     hydrALAZINE (APRESOLINE) 100 MG tablet Take 1 tablet (100 mg total) by mouth 3 (three) times daily. Dose change. 180 tablet 5   hydrochlorothiazide (HYDRODIURIL) 12.5 MG tablet Take 12.5 mg by mouth every morning.     insulin aspart (NOVOLOG) 100 UNIT/ML FlexPen Inject 20 Units into the skin daily as needed for high blood sugar (If blood sugar is over 200).     LANTUS SOLOSTAR 100 UNIT/ML Solostar Pen Inject 30 Units into the skin 2 (two) times daily.     magnesium gluconate (MAGONATE) 500 MG tablet Take 500 mg by mouth daily.     metFORMIN (GLUCOPHAGE) 500 MG tablet Take 500 mg by mouth in the morning and at bedtime.     olmesartan (BENICAR) 40 MG tablet Take 40 mg by mouth daily.     rosuvastatin (CRESTOR) 10 MG tablet Take 10 mg by mouth daily.     tamsulosin (FLOMAX) 0.4 MG CAPS capsule Take 0.4 mg by mouth 2 (two) times daily.     vitamin B-12 (CYANOCOBALAMIN) 500 MCG tablet Take 500 mcg by mouth daily.     cephALEXin (KEFLEX) 500 MG capsule Take 1 capsule (500 mg total) by mouth 4 (four) times  daily. (Patient not taking: Reported on 11/19/2021) 20 capsule 0   spironolactone (ALDACTONE) 25 MG tablet Take 0.5 tablets (12.5 mg total) by mouth 2 (two) times daily. (Patient not taking: Reported on 11/19/2021) 90 tablet 6   traMADol (ULTRAM) 50 MG tablet Take 1 tablet (50 mg total) by mouth every 6 (six) hours as needed for moderate pain. (Patient not taking: Reported on 11/19/2021) 15 tablet 0   No current facility-administered medications for this visit.    REVIEW OF SYSTEMS:  [X]  denotes positive finding, [ ]  denotes negative finding Cardiac  Comments:  Chest pain or chest pressure:    Shortness of breath upon exertion:    Short of breath when lying flat:    Irregular heart rhythm:        Vascular    Pain in calf, thigh, or hip brought on by ambulation:    Pain in feet at night that wakes you up from your sleep:     Blood clot in your veins:    Leg swelling:            PHYSICAL EXAM: Vitals:   11/19/21 1024 11/19/21 1026  BP: (!) 175/75 (!) 170/76  Pulse: 66   Resp: 14   Temp: 97.9 F (36.6 C)   TempSrc: Temporal   SpO2: 96%   Weight: 202 lb 12.8 oz (92 kg)   Height: 5' 10"  (1.778 m)     GENERAL: The patient is a well-nourished male, in no acute distress. The vital signs are documented above. CARDIOVASCULAR: Carotid arteries without bruits bilaterally.  Well-healed right carotid incision.  2+ radial pulses. PULMONARY: There is good air exchange  MUSCULOSKELETAL: There are no major deformities or cyanosis. NEUROLOGIC: No focal weakness or paresthesias are detected. SKIN: There are no ulcers or rashes noted. PSYCHIATRIC: The patient has a normal affect.  DATA:  Duplex today reveals widely patent endarterectomy with no evidence of recurrent stenosis.  His left internal carotid is the lower end of the 40 to 59% stenosis.  MEDICAL ISSUES: Stable overall.  No evidence of symptomatic carotid disease.  He knows to report immediately to the emergency room should he develop any neurologic deficits.  Otherwise we will see him in 1 year with repeat carotid duplex    [name], MD FACS [facility] [phone]  Note: Portions of this report may have been transcribed using voice recognition software.  Every effort has been made to ensure accuracy; however, inadvertent computerized transcription errors may still be present.

## 2021-12-12 ENCOUNTER — Encounter: Payer: Self-pay | Admitting: Orthopedic Surgery

## 2021-12-12 ENCOUNTER — Ambulatory Visit: Payer: Medicare HMO | Admitting: Orthopedic Surgery

## 2021-12-12 DIAGNOSIS — S68119D Complete traumatic metacarpophalangeal amputation of unspecified finger, subsequent encounter: Secondary | ICD-10-CM

## 2021-12-12 DIAGNOSIS — S68112D Complete traumatic metacarpophalangeal amputation of right middle finger, subsequent encounter: Secondary | ICD-10-CM

## 2021-12-12 NOTE — Progress Notes (Signed)
Chief Complaint  Patient presents with   Hand Problem    Finger tip amputation 09/12/21 right middle finger     Encounter Diagnosis  Name Primary?   Traumatic amputation of finger, subsequent encounter 09/12/21 Yes    Jonathan Avery's finger looks good he is concerned that it swollen not I told him to use some Epsom salt soaks  He is released today

## 2022-01-15 DIAGNOSIS — E1129 Type 2 diabetes mellitus with other diabetic kidney complication: Secondary | ICD-10-CM | POA: Diagnosis not present

## 2022-01-20 DIAGNOSIS — I10 Essential (primary) hypertension: Secondary | ICD-10-CM | POA: Diagnosis not present

## 2022-01-20 DIAGNOSIS — N1832 Chronic kidney disease, stage 3b: Secondary | ICD-10-CM | POA: Diagnosis not present

## 2022-01-20 DIAGNOSIS — E875 Hyperkalemia: Secondary | ICD-10-CM | POA: Diagnosis not present

## 2022-01-20 DIAGNOSIS — E1129 Type 2 diabetes mellitus with other diabetic kidney complication: Secondary | ICD-10-CM | POA: Diagnosis not present

## 2022-01-20 DIAGNOSIS — Z79899 Other long term (current) drug therapy: Secondary | ICD-10-CM | POA: Diagnosis not present

## 2022-01-27 DIAGNOSIS — E1122 Type 2 diabetes mellitus with diabetic chronic kidney disease: Secondary | ICD-10-CM | POA: Diagnosis not present

## 2022-01-27 DIAGNOSIS — I10 Essential (primary) hypertension: Secondary | ICD-10-CM | POA: Diagnosis not present

## 2022-01-27 DIAGNOSIS — R7309 Other abnormal glucose: Secondary | ICD-10-CM | POA: Diagnosis not present

## 2022-01-27 DIAGNOSIS — N1832 Chronic kidney disease, stage 3b: Secondary | ICD-10-CM | POA: Diagnosis not present

## 2022-02-09 DIAGNOSIS — F411 Generalized anxiety disorder: Secondary | ICD-10-CM | POA: Diagnosis not present

## 2022-02-09 DIAGNOSIS — I10 Essential (primary) hypertension: Secondary | ICD-10-CM | POA: Diagnosis not present

## 2022-02-17 ENCOUNTER — Encounter: Payer: Self-pay | Admitting: Cardiology

## 2022-02-17 ENCOUNTER — Ambulatory Visit: Payer: Medicare HMO | Attending: Cardiology | Admitting: Cardiology

## 2022-02-17 VITALS — BP 118/56 | HR 64 | Ht 70.0 in | Wt 198.8 lb

## 2022-02-17 DIAGNOSIS — I10 Essential (primary) hypertension: Secondary | ICD-10-CM | POA: Diagnosis not present

## 2022-02-17 DIAGNOSIS — E782 Mixed hyperlipidemia: Secondary | ICD-10-CM

## 2022-02-17 DIAGNOSIS — I2510 Atherosclerotic heart disease of native coronary artery without angina pectoris: Secondary | ICD-10-CM | POA: Diagnosis not present

## 2022-02-17 DIAGNOSIS — R001 Bradycardia, unspecified: Secondary | ICD-10-CM

## 2022-02-17 NOTE — Patient Instructions (Signed)

## 2022-02-17 NOTE — Progress Notes (Signed)
Clinical Summary Mr. Stettler is a 88 y.o.male seen today for follow up of the following medical problems.    1. History of CAD/MI - notes metion history of MI 20 years ago with [facility]CA at [facility]. No records available in epic. Patient denies any recurrent ischemic events.       Metoprolol stopped in the past due to low HRs and fatigue   - no chest pains, no SOB/DOE - compliant with meds   2. Bradycardia - lopressor was stopped.  - home HRs still high 40s at times - home HR 50s to low 60s.      2.  HTN - compliant with meds -does not check bp at home.    Takes meds 7AM, checks bp 9AM. AM bp's 110s-130s, occasionally 150s. Evening bp's around 5pm, typically 140s-180s at times.  - off chlorthaldone due to polyuria, also AKI.    - home bp's 150/50s, 160s/60s - appears off aldactone, will get pcp notes to see history. Suspect perhaps K increased, appears now on HCTZ as well.     3. DM2     4. Hyperlipidemia - 02/2018 TC 166 TG 477 HDL 27 - 03/2020 TC 116 TG 150 HDL 31 LDL 59   5. Carotid stensosis 12/1155 RICA 2-62%, LICA 03-55% - repeat US in June with appt with Dr Donnetta Hutching.     01/7415 RICA 3--84%, LICA 53-64%     Past Medical History:  Diagnosis Date   BPH (benign prostatic hypertrophy)    Cancer (HCC)    skin cancer - basal   Carotid artery occlusion    Diabetes (HCC)    Type II   High cholesterol    Hypertension    Myocardial infarction [facility])    Peripheral vascular disease ([place])    Stroke ([place])      documented 12/28/2019- ? TIA  " weeks 3 weeks ago, expresive aphasia lasted 2 -3 minutes."      No Known Allergies   Current Outpatient Medications  Medication Sig Dispense Refill   amLODipine (NORVASC) 10 MG tablet Take 1 tablet (10 mg total) by mouth daily. 90 tablet 1   aspirin 81 MG tablet Take 81 mg by mouth at bedtime.      cephALEXin (KEFLEX) 500 MG capsule Take 1 capsule (500 mg total) by mouth 4 (four) times daily. (Patient not taking:  Reported on 11/19/2021) 20 capsule 0   Cholecalciferol (VITAMIN D3) 10 MCG (400 UNIT) CAPS Take 800 mg by mouth daily.     cloNIDine (CATAPRES) 0.1 MG tablet Take 1 tablet (0.1 mg total) by mouth 2 (two) times daily. 180 tablet 3   diphenhydramine-acetaminophen (TYLENOL PM) 25-500 MG TABS tablet Take 1 tablet by mouth at bedtime.     hydrALAZINE (APRESOLINE) 100 MG tablet Take 1 tablet (100 mg total) by mouth 3 (three) times daily. Dose change. 180 tablet 5   hydrochlorothiazide (HYDRODIURIL) 12.5 MG tablet Take 12.5 mg by mouth every morning.     insulin aspart (NOVOLOG) 100 UNIT/ML FlexPen Inject 20 Units into the skin daily as needed for high blood sugar (If blood sugar is over 200).     LANTUS SOLOSTAR 100 UNIT/ML Solostar Pen Inject 30 Units into the skin 2 (two) times daily.     magnesium gluconate (MAGONATE) 500 MG tablet Take 500 mg by mouth daily.     metFORMIN (GLUCOPHAGE) 500 MG tablet Take 500 mg by mouth in the morning and at bedtime.  olmesartan (BENICAR) 40 MG tablet Take 40 mg by mouth daily.     rosuvastatin (CRESTOR) 10 MG tablet Take 10 mg by mouth daily.     spironolactone (ALDACTONE) 25 MG tablet Take 0.5 tablets (12.5 mg total) by mouth 2 (two) times daily. (Patient not taking: Reported on 11/19/2021) 90 tablet 6   tamsulosin (FLOMAX) 0.4 MG CAPS capsule Take 0.4 mg by mouth 2 (two) times daily.     traMADol (ULTRAM) 50 MG tablet Take 1 tablet (50 mg total) by mouth every 6 (six) hours as needed for moderate pain. (Patient not taking: Reported on 11/19/2021) 15 tablet 0   vitamin B-12 (CYANOCOBALAMIN) 500 MCG tablet Take 500 mcg by mouth daily.     No current facility-administered medications for this visit.     Past Surgical History:  Procedure Laterality Date   AMPUTATION Right 09/12/2021   Procedure: AMPUTATION DIGIT;  Surgeon: Carole Civil, MD;  Location: [facility];  Service: Orthopedics;  Laterality: Right;   [place]    COLONOSCOPY  07/20/2006   Dr. [name]:Status post right hemicolectomy. Residual colonic mucosa appeared normal, normal rectum.    COLONOSCOPY  2006/2007   sprawling villous adenoma at ileocecal valve   COLONOSCOPY  2011   Dr. [name]: normal rectum, pancolonic diverticulosis, 2 diminutive polyps, with path benign polypoid colonic mucosa   COLONOSCOPY N/A 01/02/2015   Procedure: COLONOSCOPY;  Surgeon: Daneil Dolin, MD;  [facility];  Service: Endoscopy;  Laterality: N/A;  1245   CORONARY ANGIOPLASTY     ENDARTERECTOMY Right 12/29/2019   Procedure: ENDARTERECTOMY CAROTID;  Surgeon: [name], MD;  Location[facility] OR;  Service: Vascular;  Laterality: Right;   ESOPHAGOGASTRODUODENOSCOPY (EGD) WITH PROPOFOL N/A 01/13/2018   Procedure: ESOPHAGOGASTRODUODENOSCOPY (EGD) WITH PROPOFOL;  Surgeon: [name], MD;  [facility];  Service: Endoscopy;  Laterality: N/A;   EUS N/A 01/13/2018   Procedure: UPPER ENDOSCOPIC ULTRASOUND (EUS) RADIAL;  Surgeon: [name], MD;  [facility];  Service: Endoscopy;  Laterality: N/A;   FINE NEEDLE ASPIRATION N/A 01/13/2018   Procedure: FINE NEEDLE ASPIRATION (FNA) LINEAR;  Surgeon: [name], MD;  [facility];  Service: Endoscopy;  Laterality: N/A;   open hemicolectomy  2007   due to villous adenoma   PATCH ANGIOPLASTY Right 12/29/2019   Procedure: RIGHT CAROTID PATCH ANGIOPLASTY USING HEMASHIELD PLATINUM FINESSE;  Surgeon: [name], MD;  Location: [facility] OR;  Service: Vascular;  Laterality: Right;     No Known Allergies    Family History  Problem Relation Age of Onset   Ulcers Father    Diabetes type II Brother    Diabetes type II Brother    Colon cancer Neg Hx      Social History Mr. Kunesh reports that he has quit smoking. His smoking use included cigars. He has never used smokeless tobacco. Mr. Ancheta reports no history of alcohol use.   Review of Systems CONSTITUTIONAL: No weight loss, fever,  chills, weakness or fatigue.  HEENT: Eyes: No visual loss, blurred vision, double vision or yellow sclerae.No hearing loss, sneezing, congestion, runny nose or sore throat.  SKIN: No rash or itching.  CARDIOVASCULAR: per hpi RESPIRATORY: No shortness of breath, cough or sputum.  GASTROINTESTINAL: No anorexia, nausea, vomiting or diarrhea. No abdominal pain or blood.  GENITOURINARY: No burning on urination, no polyuria NEUROLOGICAL: No headache, dizziness, syncope, paralysis, ataxia, numbness or tingling in the extremities. No  change in bowel or bladder control.  MUSCULOSKELETAL: No muscle, back pain, joint pain or stiffness.  LYMPHATICS: No enlarged nodes. No history of splenectomy.  PSYCHIATRIC: No history of depression or anxiety.  ENDOCRINOLOGIC: No reports of sweating, cold or heat intolerance. No polyuria or polydipsia.  Marland Kitchen   Physical Examination Today's Vitals   02/17/22 0943  BP: (!) 118/56  Pulse: 64  SpO2: 97%  Weight: 198 lb 12.8 oz (90.2 kg)  Height: 5' 10"  (1.778 m)   Body mass index is 28.52 kg/m.  Gen: resting comfortably, no acute distress HEENT: no scleral icterus, pupils equal round and reactive, no palptable cervical adenopathy,  CV: RRR, no m/r/g, no JVD. +bilateral carotid bruits Resp: Clear to auscultation bilaterally GI: abdomen is soft, non-tender, non-distended, normal bowel sounds, no hepatosplenomegaly MSK: extremities are warm, no edema.  Skin: warm, no rash Neuro:  no focal deficits Psych: appropriate affect   Diagnostic Studies 03/2021 Renal artery US Renal:     Right: Normal size right kidney. Abnormal right Resistive Index.         Abnormal cortical thickness of right kidney. 1-59% stenosis         of the right renal artery. RRV flow present. Cyst(s) noted.         Avascular cystic mass noted in the mid pole of the right         kidney, measuring 1.3 x 1.2 x 1.3 cm.  Left:  Normal size of left kidney. Abnormal left Resistive Index.          Abnormal cortical thickness of the left kidney. 1-59%         stenosis of the left renal artery. LRV flow present. Cyst(s)         noted. Avascular cystic mass in the mid pole of the left         kidney, measuring 3.8 x 3.6 x 3.9 cm.  Mesenteric:  Normal Celiac artery and Superior Mesenteric artery findings.      03/2021 monitor Patch wear time was 6 days and 18hours Predominant rhythm is NSR with average HR 57bpm (ranging 42-141bpm) One run of nonsustained VT lasting 4 beats Six runs of nonsustained SVT with longest 8 beats at 101bpm Rare SVE, rare VE (<1%) No sustained arrhythmias or significant pauses     Patch Wear Time:  6 days and 18 hours (2022-10-31T15:18:10-398 to 2022-11-07T08:43:32-0500)   Patient had a min HR of 42 bpm, max HR of 141 bpm, and avg HR of 57 bpm. Predominant underlying rhythm was Sinus Rhythm. 1 run of Ventricular Tachycardia occurred lasting 4 beats with a max rate of 141 bpm (avg 128 bpm). 6 Supraventricular Tachycardia  runs occurred, the run with the fastest interval lasting 6 beats with a max rate of 128 bpm, the longest lasting 8 beats with an avg rate of 101 bpm. Isolated SVEs were rare (<1.0%), SVE Couplets were rare (<1.0%), and SVE Triplets were rare (<1.0%).  Isolated VEs were rare (<1.0%, 591), VE Couplets were rare (<1.0%, 11), and VE Triplets were rare (<1.0%, 2). Ventricular Bigeminy was present.     Assessment and Plan  1. CAD - reported history of MI at least 20 years ago, he reports angioplasty at that time. Do not see records in [facility] system. Has not required repeat cath -no recent symptoms, continue current meds     2. Resistant HTN  - medical management affected by aggressive bp, wide pulse pressure often with low DBPs, bradycardia, and renal  dysfunction. Significant AKI on chlortahlidone - given limitations above and advanced age SBP 150s would be reasonable, concern about his DBP dropping too low.  - continue current meds     3.  Hyperlipidemia - request labs from pcp, continue current meds   4. Bradycardia - improved of lopressor with weaning clonidine  - continue to moniotor - EKG today SR 65      Arnoldo Lenis, M.D.

## 2022-04-01 ENCOUNTER — Other Ambulatory Visit: Payer: Self-pay | Admitting: Cardiology

## 2022-04-13 DIAGNOSIS — N1832 Chronic kidney disease, stage 3b: Secondary | ICD-10-CM | POA: Diagnosis not present

## 2022-04-13 DIAGNOSIS — E1129 Type 2 diabetes mellitus with other diabetic kidney complication: Secondary | ICD-10-CM | POA: Diagnosis not present

## 2022-04-13 DIAGNOSIS — I10 Essential (primary) hypertension: Secondary | ICD-10-CM | POA: Diagnosis not present

## 2022-04-13 DIAGNOSIS — Z79899 Other long term (current) drug therapy: Secondary | ICD-10-CM | POA: Diagnosis not present

## 2022-04-13 DIAGNOSIS — I2510 Atherosclerotic heart disease of native coronary artery without angina pectoris: Secondary | ICD-10-CM | POA: Diagnosis not present

## 2022-04-15 DIAGNOSIS — E1129 Type 2 diabetes mellitus with other diabetic kidney complication: Secondary | ICD-10-CM | POA: Diagnosis not present

## 2022-04-20 DIAGNOSIS — I10 Essential (primary) hypertension: Secondary | ICD-10-CM | POA: Diagnosis not present

## 2022-04-20 DIAGNOSIS — E785 Hyperlipidemia, unspecified: Secondary | ICD-10-CM | POA: Diagnosis not present

## 2022-04-20 DIAGNOSIS — R7309 Other abnormal glucose: Secondary | ICD-10-CM | POA: Diagnosis not present

## 2022-04-20 DIAGNOSIS — I2510 Atherosclerotic heart disease of native coronary artery without angina pectoris: Secondary | ICD-10-CM | POA: Diagnosis not present

## 2022-04-20 DIAGNOSIS — E1122 Type 2 diabetes mellitus with diabetic chronic kidney disease: Secondary | ICD-10-CM | POA: Diagnosis not present

## 2022-04-20 DIAGNOSIS — N1832 Chronic kidney disease, stage 3b: Secondary | ICD-10-CM | POA: Diagnosis not present

## 2022-04-20 DIAGNOSIS — Z23 Encounter for immunization: Secondary | ICD-10-CM | POA: Diagnosis not present

## 2022-07-17 DIAGNOSIS — N1832 Chronic kidney disease, stage 3b: Secondary | ICD-10-CM | POA: Diagnosis not present

## 2022-07-17 DIAGNOSIS — Z79899 Other long term (current) drug therapy: Secondary | ICD-10-CM | POA: Diagnosis not present

## 2022-07-17 DIAGNOSIS — E1129 Type 2 diabetes mellitus with other diabetic kidney complication: Secondary | ICD-10-CM | POA: Diagnosis not present

## 2022-07-20 ENCOUNTER — Other Ambulatory Visit: Payer: Self-pay | Admitting: Radiology

## 2022-07-20 NOTE — Telephone Encounter (Signed)
Refill request received via fax for  Tramadol [place]

## 2022-07-21 MED ORDER — TRAMADOL HCL 50 MG PO TABS: 50.0000 mg | ORAL_TABLET | Freq: Four times a day (QID) | ORAL | 5 refills | Status: DC | PRN

## 2022-07-23 ENCOUNTER — Encounter: Payer: Self-pay | Admitting: Radiology

## 2022-07-24 DIAGNOSIS — I10 Essential (primary) hypertension: Secondary | ICD-10-CM | POA: Diagnosis not present

## 2022-07-24 DIAGNOSIS — N1831 Chronic kidney disease, stage 3a: Secondary | ICD-10-CM | POA: Diagnosis not present

## 2022-07-24 DIAGNOSIS — N1832 Chronic kidney disease, stage 3b: Secondary | ICD-10-CM | POA: Diagnosis not present

## 2022-07-24 DIAGNOSIS — E1122 Type 2 diabetes mellitus with diabetic chronic kidney disease: Secondary | ICD-10-CM | POA: Diagnosis not present

## 2022-07-24 DIAGNOSIS — R7309 Other abnormal glucose: Secondary | ICD-10-CM | POA: Diagnosis not present

## 2022-07-29 DIAGNOSIS — E1129 Type 2 diabetes mellitus with other diabetic kidney complication: Secondary | ICD-10-CM | POA: Diagnosis not present

## 2022-08-04 ENCOUNTER — Other Ambulatory Visit: Payer: Self-pay | Admitting: Orthopedic Surgery

## 2022-08-14 ENCOUNTER — Other Ambulatory Visit: Payer: Self-pay | Admitting: Cardiology

## 2022-10-09 DIAGNOSIS — H903 Sensorineural hearing loss, bilateral: Secondary | ICD-10-CM | POA: Diagnosis not present

## 2022-10-30 IMAGING — DX DG FINGER MIDDLE 2+V*R*
3 series · 3 of 3 positions shown · non-contrast
Comparison: 09/05/2021

CLINICAL DATA: Amputation of right middle finger while changing a
tire on trailer.

EXAM:
RIGHT MIDDLE FINGER 2+V

[finger ap]
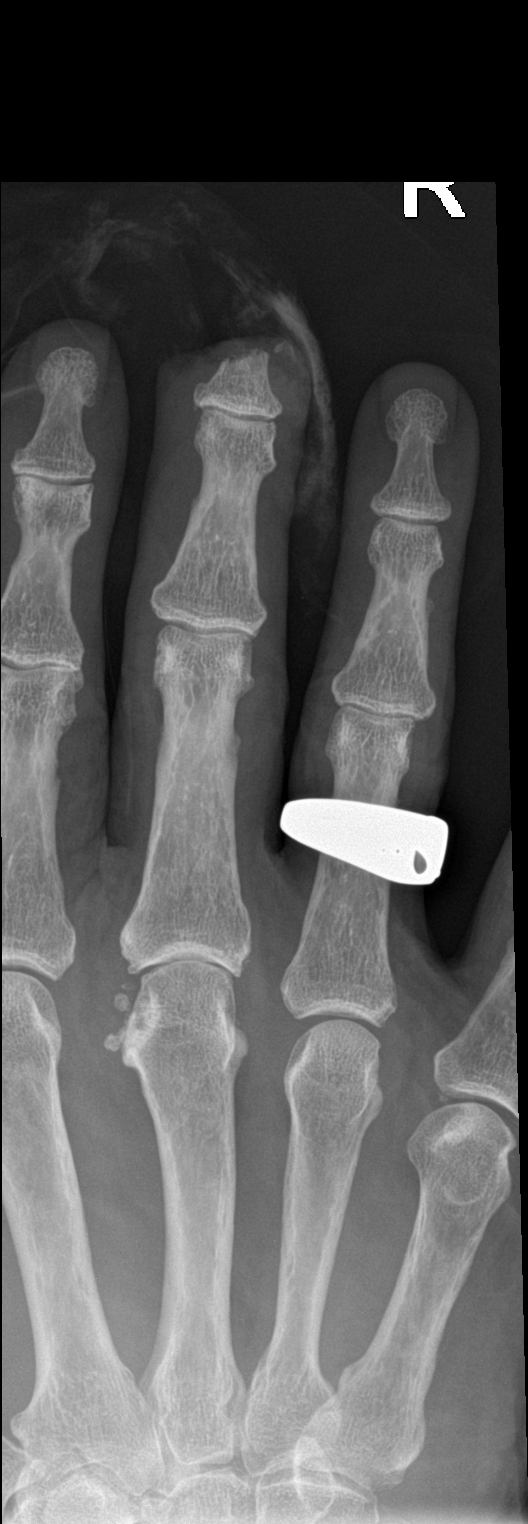

[finger obl]
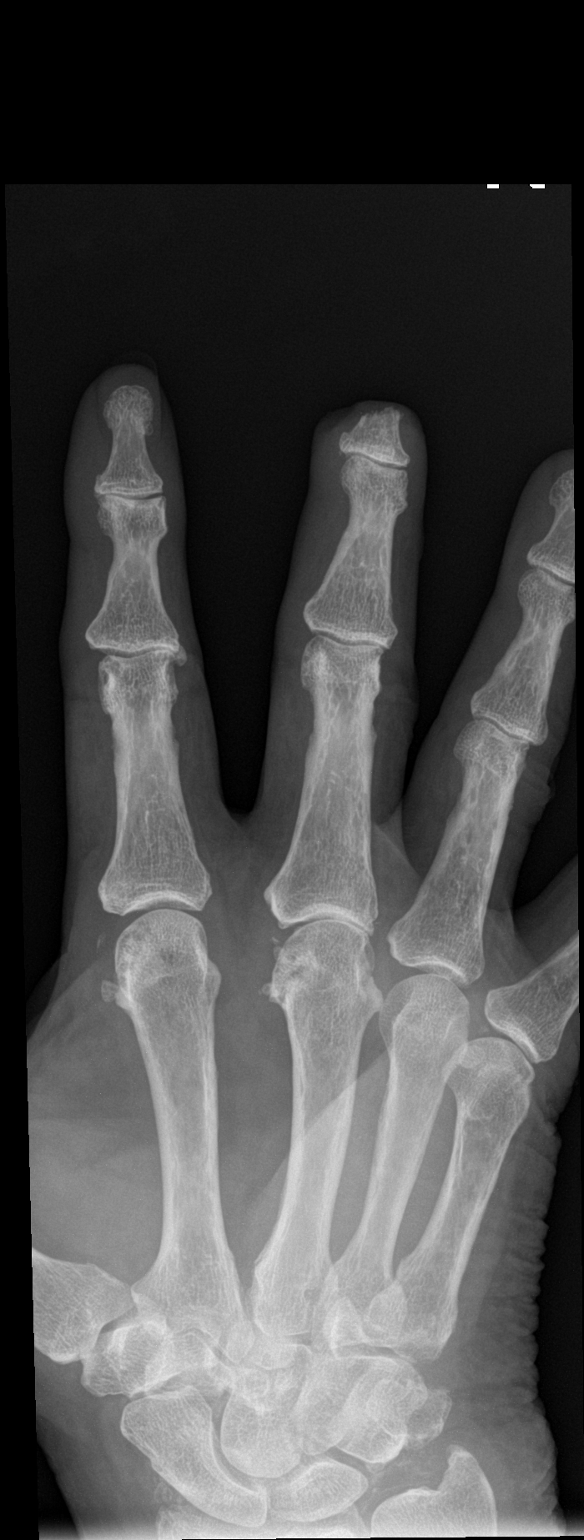

[finger lat]
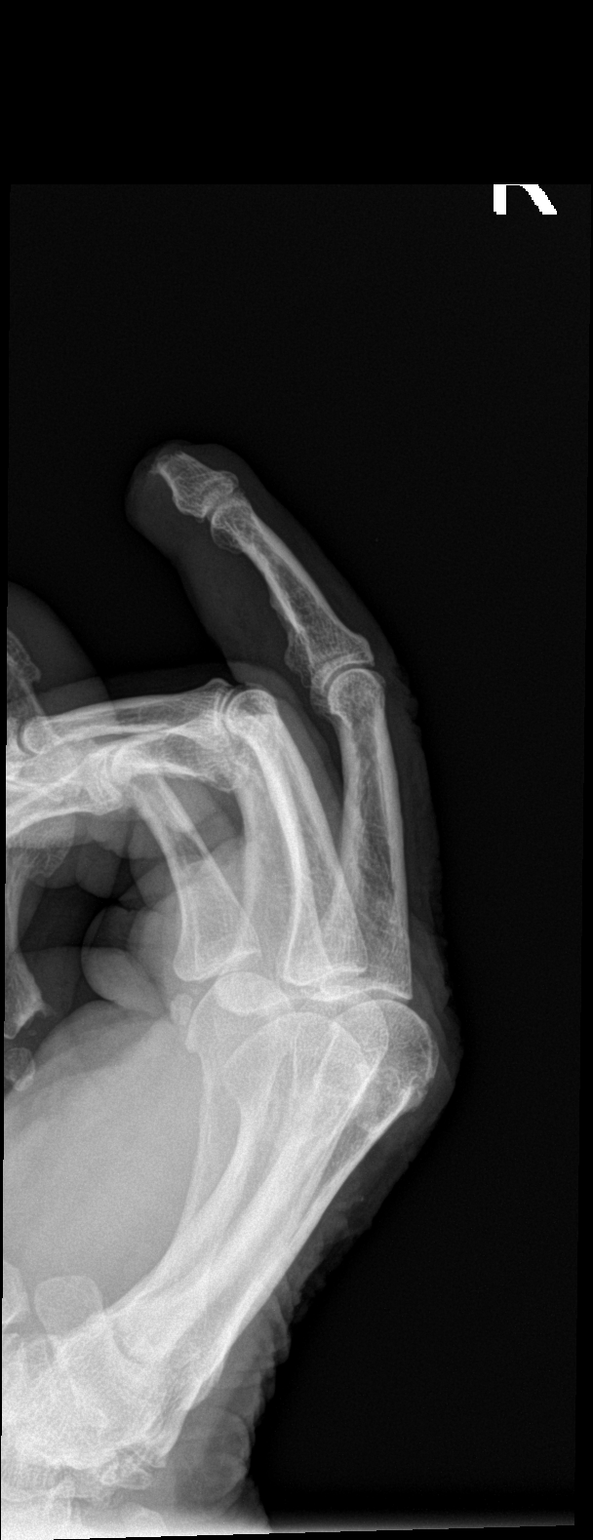

[3 of 3 positions shown; findings below may reference images not displayed]

FINDINGS: There has been posttraumatic osseous and soft tissue amputation of
the mid shaft and tuft of the third distal phalanx. No additional
fracture or dislocation identified. Degenerative changes are noted
involving the third MCP joint.
IMPRESSION: Posttraumatic osseous and soft tissue amputation of the mid shaft
and tuft of the third distal phalanx.

## 2022-12-03 DIAGNOSIS — L570 Actinic keratosis: Secondary | ICD-10-CM | POA: Diagnosis not present

## 2022-12-03 DIAGNOSIS — X32XXXD Exposure to sunlight, subsequent encounter: Secondary | ICD-10-CM | POA: Diagnosis not present

## 2023-01-05 ENCOUNTER — Emergency Department (HOSPITAL_COMMUNITY)
Admission: EM | Admit: 2023-01-05 | Discharge: 2023-01-05 | Disposition: A | Discharge status: Home / Self Care | Payer: Medicare HMO | Attending: Emergency Medicine | Admitting: Emergency Medicine

## 2023-01-05 ENCOUNTER — Emergency Department (HOSPITAL_COMMUNITY): Payer: Medicare HMO

## 2023-01-05 DIAGNOSIS — R531 Weakness: Secondary | ICD-10-CM | POA: Diagnosis not present

## 2023-01-05 DIAGNOSIS — N189 Chronic kidney disease, unspecified: Secondary | ICD-10-CM | POA: Insufficient documentation

## 2023-01-05 DIAGNOSIS — Z6827 Body mass index (BMI) 27.0-27.9, adult: Secondary | ICD-10-CM | POA: Diagnosis not present

## 2023-01-05 DIAGNOSIS — E1122 Type 2 diabetes mellitus with diabetic chronic kidney disease: Secondary | ICD-10-CM | POA: Insufficient documentation

## 2023-01-05 DIAGNOSIS — Z79899 Other long term (current) drug therapy: Secondary | ICD-10-CM | POA: Diagnosis not present

## 2023-01-05 DIAGNOSIS — Z794 Long term (current) use of insulin: Secondary | ICD-10-CM | POA: Diagnosis not present

## 2023-01-05 DIAGNOSIS — I4510 Unspecified right bundle-branch block: Secondary | ICD-10-CM | POA: Diagnosis not present

## 2023-01-05 DIAGNOSIS — I129 Hypertensive chronic kidney disease with stage 1 through stage 4 chronic kidney disease, or unspecified chronic kidney disease: Secondary | ICD-10-CM | POA: Diagnosis not present

## 2023-01-05 DIAGNOSIS — R55 Syncope and collapse: Secondary | ICD-10-CM | POA: Insufficient documentation

## 2023-01-05 DIAGNOSIS — Z7982 Long term (current) use of aspirin: Secondary | ICD-10-CM | POA: Insufficient documentation

## 2023-01-05 DIAGNOSIS — Z7984 Long term (current) use of oral hypoglycemic drugs: Secondary | ICD-10-CM | POA: Diagnosis not present

## 2023-01-05 DIAGNOSIS — R42 Dizziness and giddiness: Secondary | ICD-10-CM

## 2023-01-05 DIAGNOSIS — R9431 Abnormal electrocardiogram [ECG] [EKG]: Secondary | ICD-10-CM | POA: Diagnosis not present

## 2023-01-05 DIAGNOSIS — Z8673 Personal history of transient ischemic attack (TIA), and cerebral infarction without residual deficits: Secondary | ICD-10-CM | POA: Diagnosis not present

## 2023-01-05 DIAGNOSIS — I10 Essential (primary) hypertension: Secondary | ICD-10-CM | POA: Diagnosis not present

## 2023-01-05 DIAGNOSIS — E663 Overweight: Secondary | ICD-10-CM | POA: Diagnosis not present

## 2023-01-05 DIAGNOSIS — R079 Chest pain, unspecified: Secondary | ICD-10-CM | POA: Diagnosis not present

## 2023-01-05 DIAGNOSIS — I4891 Unspecified atrial fibrillation: Secondary | ICD-10-CM | POA: Diagnosis not present

## 2023-01-05 LAB — CBC WITH DIFFERENTIAL/PLATELET
Abs Immature Granulocytes: 0.04 10*3/uL (ref 0.00–0.07)
Basophils Absolute: 0 10*3/uL (ref 0.0–0.1)
Basophils Relative: 1 %
Eosinophils Absolute: 0.2 10*3/uL (ref 0.0–0.5)
Eosinophils Relative: 3 %
HCT: 38.4 % — ABNORMAL LOW (ref 39.0–52.0)
Hemoglobin: 12.8 g/dL — ABNORMAL LOW (ref 13.0–17.0)
Immature Granulocytes: 1 %
Lymphocytes Relative: 22 %
Lymphs Abs: 1.7 10*3/uL (ref 0.7–4.0)
MCH: 30.8 pg (ref 26.0–34.0)
MCHC: 33.3 g/dL (ref 30.0–36.0)
MCV: 92.3 fL (ref 80.0–100.0)
Monocytes Absolute: 0.4 10*3/uL (ref 0.1–1.0)
Monocytes Relative: 5 %
Neutro Abs: 5.6 10*3/uL (ref 1.7–7.7)
Neutrophils Relative %: 68 %
Platelets: 151 10*3/uL (ref 150–400)
RBC: 4.16 MIL/uL — ABNORMAL LOW (ref 4.22–5.81)
RDW: 13.1 % (ref 11.5–15.5)
WBC: 8 10*3/uL (ref 4.0–10.5)
nRBC: 0 % (ref 0.0–0.2)

## 2023-01-05 LAB — CBG MONITORING, ED: Glucose-Capillary: 122 mg/dL — ABNORMAL HIGH (ref 70–99)

## 2023-01-05 LAB — BASIC METABOLIC PANEL
Anion gap: 9 (ref 5–15)
BUN: 30 mg/dL — ABNORMAL HIGH (ref 8–23)
CO2: 20 mmol/L — ABNORMAL LOW (ref 22–32)
Calcium: 9 mg/dL (ref 8.9–10.3)
Chloride: 105 mmol/L (ref 98–111)
Creatinine, Ser: 1.35 mg/dL — ABNORMAL HIGH (ref 0.61–1.24)
GFR, Estimated: 50 mL/min — ABNORMAL LOW (ref >60)
Glucose, Bld: 135 mg/dL — ABNORMAL HIGH (ref 70–99)
Potassium: 4.5 mmol/L (ref 3.5–5.1)
Sodium: 134 mmol/L — ABNORMAL LOW (ref 135–145)

## 2023-01-05 LAB — TROPONIN I (HIGH SENSITIVITY)
Troponin I (High Sensitivity): 11 ng/L (ref <18)
Troponin I (High Sensitivity): 18 ng/L — ABNORMAL HIGH (ref <18)

## 2023-01-05 LAB — HEPATIC FUNCTION PANEL
ALT: 18 U/L (ref 0–44)
AST: 17 U/L (ref 15–41)
Albumin: 3.5 g/dL (ref 3.5–5.0)
Alkaline Phosphatase: 51 U/L (ref 38–126)
Bilirubin, Direct: 0.1 mg/dL (ref 0.0–0.2)
Indirect Bilirubin: 0.4 mg/dL (ref 0.3–0.9)
Total Bilirubin: 0.5 mg/dL (ref 0.3–1.2)
Total Protein: 6 g/dL — ABNORMAL LOW (ref 6.5–8.1)

## 2023-01-05 LAB — MAGNESIUM: Magnesium: 1.6 mg/dL — ABNORMAL LOW (ref 1.7–2.4)

## 2023-01-05 MED ORDER — MECLIZINE HCL 12.5 MG PO TABS
12.5000 mg | ORAL_TABLET | Freq: Once | ORAL | Status: AC
  Administered 2023-01-05: 12.5 mg via ORAL
  Filled 2023-01-05: qty 1

## 2023-01-05 MED ORDER — MAGNESIUM SULFATE 2 GM/50ML IV SOLN
2.0000 g | Freq: Once | INTRAVENOUS | Status: AC
  Administered 2023-01-05: 2 g via INTRAVENOUS
  Filled 2023-01-05: qty 50

## 2023-01-05 MED ORDER — MECLIZINE HCL 25 MG PO TABS: 25.0000 mg | ORAL_TABLET | Freq: Three times a day (TID) | ORAL | 0 refills | Status: DC | PRN

## 2023-01-05 NOTE — Discharge Instructions (Addendum)
Make an appointment to follow-up with your primary care doctor.  Return for any new or worse symptoms.  Take the Antivert as directed.  You can take 1/2 tablet if you want to.  Workup today without any significant findings.

## 2023-01-05 NOTE — ED Triage Notes (Signed)
Pt arrived via [facility] from urgent care as EKG showed concerns for R bundle branch block and possible MI. Per EMS, BP 210/75, generalized weakness that lasts "for about a second and then goes away" denies any syncopal events. Pt A&O. Cbg 204

## 2023-01-05 NOTE — ED Provider Notes (Signed)
[place] EMERGENCY DEPARTMENT AT [facility] Provider Note   CSN: [id number] Arrival date & time: 01/05/23  1150     History  Chief Complaint  Patient presents with   Weakness    Jonathan Avery is a 89 y.o. male.   Weakness Associated symptoms: dizziness   Patient presents for dizziness.  Dizziness has been intermittent over the past 4 days.  He notices it when he stands up.  Earlier today, when he went to bend over, he had lightheadedness.  He did not lose consciousness.  He went to urgent care.  At urgent care, they obtained an EKG and sent him to the emergency department.  Currently, he is asymptomatic.  He did take his morning medications.  Per chart review, he is prescribed amlodipine, clonidine, hydralazine, HCTZ, olmesartan.     Home Medications Prior to Admission medications   Medication Sig Start Date End Date Taking? Authorizing Provider  ALPRAZolam (XANAX) 0.25 MG tablet Take 0.25 mg by mouth 2 (two) times daily as needed. 02/09/22   [provider]  amLODipine (NORVASC) 10 MG tablet TAKE ONE TABLET (10MG  TOTAL) BY MOUTH DAILY 04/02/22   Antoine Poche, MD  aspirin 81 MG tablet Take 81 mg by mouth at bedtime.     [provider]  Cholecalciferol (VITAMIN D3) 10 MCG (400 UNIT) CAPS Take 800 mg by mouth daily.    [provider]  cloNIDine (CATAPRES) 0.1 MG tablet TAKE ONE TABLET (0.1MG  TOTAL) BY MOUTH TWO TIMES DAILY 08/14/22   Antoine Poche, MD  hydrALAZINE (APRESOLINE) 100 MG tablet Take 1 tablet (100 mg total) by mouth 3 (three) times daily. Dose change. 05/28/20   Strader, Lennart Pall, PA-C  hydrochlorothiazide (HYDRODIURIL) 12.5 MG tablet Take 12.5 mg by mouth every morning. 10/15/21   [provider]  LANTUS SOLOSTAR 100 UNIT/ML Solostar Pen Inject 20 Units into the skin daily. 11/27/14   [provider]  magnesium gluconate (MAGONATE) 500 MG tablet Take 500 mg by mouth daily.    [provider]   metFORMIN (GLUCOPHAGE) 500 MG tablet Take 500 mg by mouth in the morning and at bedtime.    [provider]  olmesartan (BENICAR) 40 MG tablet Take 40 mg by mouth daily. 09/09/20   [provider]  rosuvastatin (CRESTOR) 10 MG tablet Take 10 mg by mouth daily.    [provider]  tamsulosin (FLOMAX) 0.4 MG CAPS capsule Take 0.4 mg by mouth daily. 11/05/14   [provider]  traMADol (ULTRAM) 50 MG tablet TAKE ONE TABLET (50MG  TOTAL) BY MOUTH EVERY SIX HOURS AS NEEDED FOR MODERATE PAIN 08/05/22   Vickki Hearing, MD  vitamin B-12 (CYANOCOBALAMIN) 500 MCG tablet Take 500 mcg by mouth daily.    [provider]      Allergies    Patient has no known allergies.    Review of Systems   Review of Systems  Neurological:  Positive for dizziness and light-headedness.  All other systems reviewed and are negative.   Physical Exam Updated Vital Signs BP (!) 172/95 (BP Location: Right Arm)   Pulse 73   Temp 97.7 F (36.5 C) (Oral)   Resp 16   SpO2 99%  Physical Exam Vitals and nursing note reviewed.  Constitutional:      General: He is not in acute distress.    Appearance: Normal appearance. He is well-developed. He is not ill-appearing, toxic-appearing or diaphoretic.  HENT:     Head: Normocephalic  and atraumatic.     Right Ear: External ear normal.     Left Ear: External ear normal.     Nose: Nose normal.     Mouth/Throat:     Mouth: Mucous membranes are moist.  Eyes:     Extraocular Movements: Extraocular movements intact.     Conjunctiva/sclera: Conjunctivae normal.  Cardiovascular:     Rate and Rhythm: Normal rate and regular rhythm.  Pulmonary:     Effort: Pulmonary effort is normal. No respiratory distress.  Abdominal:     General: There is no distension.     Palpations: Abdomen is soft.     Tenderness: There is no abdominal tenderness.  Musculoskeletal:        General: No swelling. Normal range of motion.     Cervical back:  Normal range of motion and neck supple.  Skin:    General: Skin is warm and dry.     Coloration: Skin is not jaundiced or pale.  Neurological:     General: No focal deficit present.     Mental Status: He is alert and oriented to person, place, and time.     Cranial Nerves: No cranial nerve deficit.     Sensory: No sensory deficit.     Motor: No weakness.     Coordination: Coordination normal.  Psychiatric:        Mood and Affect: Mood normal.        Behavior: Behavior normal.        Thought Content: Thought content normal.        Judgment: Judgment normal.     ED Results / Procedures / Treatments   Labs (all labs ordered are listed, but only abnormal results are displayed) Labs Reviewed  BASIC METABOLIC PANEL - Abnormal; Notable for the following components:      Result Value   Sodium 134 (*)    CO2 20 (*)    Glucose, Bld 135 (*)    BUN 30 (*)    Creatinine, Ser 1.35 (*)    GFR, Estimated 50 (*)    All other components within normal limits  CBC WITH DIFFERENTIAL/PLATELET - Abnormal; Notable for the following components:   RBC 4.16 (*)    Hemoglobin 12.8 (*)    HCT 38.4 (*)    All other components within normal limits  HEPATIC FUNCTION PANEL - Abnormal; Notable for the following components:   Total Protein 6.0 (*)    All other components within normal limits  MAGNESIUM - Abnormal; Notable for the following components:   Magnesium 1.6 (*)    All other components within normal limits  CBG MONITORING, ED - Abnormal; Notable for the following components:   Glucose-Capillary 122 (*)    All other components within normal limits  URINALYSIS, ROUTINE W REFLEX MICROSCOPIC  TROPONIN I (HIGH SENSITIVITY)    EKG None  Radiology DG Chest 1 View  Result Date: 01/05/2023 CLINICAL DATA:  near syncope EXAM: CHEST  1 VIEW COMPARISON:  CXR 03/10/21 FINDINGS: No pleural effusion. No pneumothorax. Normal cardiac and mediastinal contours. No radiographically apparent displaced rib  fractures. No focal airspace opacity. Visualized upper abdomen is unremarkable. Degenerative changes of the right AC joint IMPRESSION: No focal airspace opacity Electronically Signed   By: Lorenza Cambridge M.D.   On: 01/05/2023 14:51    Procedures Procedures    Medications Ordered in ED Medications  magnesium sulfate IVPB 2 g 50 mL (2 g Intravenous New Bag/Given 01/05/23 1607)  meclizine (ANTIVERT) tablet  12.5 mg (12.5 mg Oral Given 01/05/23 1608)    ED Course/ Medical Decision Making/ A&P                                 Medical Decision Making Amount and/or Complexity of Data Reviewed Labs: ordered. Radiology: ordered. ECG/medicine tests: ordered.  Risk Prescription drug management.   This patient presents to the ED for concern of near syncope, this involves an extensive number of treatment options, and is a complaint that carries with it a high risk of complications and morbidity.  The differential diagnosis includes arrhythmia, dehydration, polypharmacy, dehydration, metabolic derangements   Co morbidities that complicate the patient evaluation  BPH, DM, HLD, HTN, CVA, CKD   Additional history obtained:  Additional history obtained from N/A External records from outside source obtained and reviewed including EMR   Lab Tests:  I Ordered, and personally interpreted labs.  The pertinent results include: Hypomagnesemia with otherwise normal electrolytes, baseline anemia, no leukocytosis   Imaging Studies ordered:  I ordered imaging studies including chest x-ray I independently visualized and interpreted imaging which showed no acute findings I agree with the radiologist interpretation   Cardiac Monitoring: / EKG:  The patient was maintained on a cardiac monitor.  I personally viewed and interpreted the cardiac monitored which showed an underlying rhythm of: Sinus rhythm  Problem List / ED Course / Critical interventions / Medication management  Patient presents for  dizziness and near syncopal symptoms.  Vital signs on arrival notable for hypertension.  Patient is well-appearing on exam.  Currently, at rest, he is asymptomatic.  Urgent care was concerned about his EKG.  On review of chart, EKG from urgent care shows no changes from prior tracings.  Given his nursing with symptoms, workup was initiated.  EKG here is similar to prior EKGs, including the one obtained urgent care today.  Initial lab work is notable for hypomagnesemia.  Replacing magnesium was ordered.  Patient was ordered meclizine for symptomatic relief of his positional dizziness.  Remaining lab work pending at time of signout.  Care of patient was signed out to oncoming ED provider. I ordered medication including magnesium sulfate for hypomagnesemia; meclizine for positional dizziness Reevaluation of the patient after these medicines showed that the patient improved I have reviewed the patients home medicines and have made adjustments as needed   Social Determinants of Health:  Has access to outpatient care        Final Clinical Impression(s) / ED Diagnoses Final diagnoses:  Postural dizziness with near syncope    Rx / DC Orders ED Discharge Orders     None         Gloris Manchester, MD 01/05/23 1615

## 2023-01-05 NOTE — ED Notes (Signed)
Patient transported to X-ray 

## 2023-01-05 NOTE — ED Notes (Signed)
MD at bedside. 

## 2023-01-05 NOTE — ED Notes (Signed)
EKG given to MD

## 2023-01-05 NOTE — ED Provider Notes (Addendum)
Patient's delta troponin was 18 and initial troponin was 11.  Patient was also to have orthostatics and see how he did.  Daytime emergency physician Dr. Durwin Nora felt patient could probably be discharged home.   Vanetta Mulders, MD 01/05/23 1757  Patient felt fine with the orthostatics.  A little bit of slight drop in blood pressure but nothing significant.  Patient also feels much better from the dizziness standpoint.  Patient will be treated with Antivert.  He will follow-up with his primary care doctor Dr. Ouida Sills.    Vanetta Mulders, MD 01/05/23 [phone]

## 2023-01-13 ENCOUNTER — Telehealth: Payer: Self-pay

## 2023-01-13 NOTE — Telephone Encounter (Signed)
Transition Care Management Unsuccessful Follow-up Telephone Call  Date of discharge and from where:  01/05/2023 [facility]  Attempts:  2nd Attempt  Reason for unsuccessful TCM follow-up call:  Left voice message  Nashly Olsson [facility]  [facility] Population Health Community Resource Care Guide   [email or web address]  ?? [phone]   Website: [email or web address]  [email or web address]

## 2023-01-13 NOTE — Telephone Encounter (Signed)
Transition Care Management Unsuccessful Follow-up Telephone Call  Date of discharge and from where:  01/05/2023 [facility]  Attempts:  1st Attempt  Reason for unsuccessful TCM follow-up call:  Left voice message  Bita Cartwright [facility]  [facility] Population Health Community Resource Care Guide   [email or web address]  ?? [phone]   Website: [email or web address]  [email or web address]

## 2023-02-11 ENCOUNTER — Other Ambulatory Visit: Payer: Self-pay | Admitting: *Deleted

## 2023-02-11 DIAGNOSIS — I6529 Occlusion and stenosis of unspecified carotid artery: Secondary | ICD-10-CM

## 2023-02-15 ENCOUNTER — Other Ambulatory Visit: Payer: Self-pay | Admitting: Cardiology

## 2023-02-18 ENCOUNTER — Ambulatory Visit: Payer: Medicare HMO | Attending: Nurse Practitioner | Admitting: Nurse Practitioner

## 2023-02-18 ENCOUNTER — Encounter: Payer: Self-pay | Admitting: Nurse Practitioner

## 2023-02-18 VITALS — BP 157/62 | HR 65 | Ht 69.0 in | Wt 193.0 lb

## 2023-02-18 DIAGNOSIS — I6523 Occlusion and stenosis of bilateral carotid arteries: Secondary | ICD-10-CM

## 2023-02-18 DIAGNOSIS — E785 Hyperlipidemia, unspecified: Secondary | ICD-10-CM | POA: Diagnosis not present

## 2023-02-18 DIAGNOSIS — I10 Essential (primary) hypertension: Secondary | ICD-10-CM

## 2023-02-18 DIAGNOSIS — I2510 Atherosclerotic heart disease of native coronary artery without angina pectoris: Secondary | ICD-10-CM

## 2023-02-18 DIAGNOSIS — R42 Dizziness and giddiness: Secondary | ICD-10-CM | POA: Diagnosis not present

## 2023-02-18 MED ORDER — HYDRALAZINE HCL 100 MG PO TABS: 100.0000 mg | ORAL_TABLET | Freq: Three times a day (TID) | ORAL | 5 refills | Status: AC

## 2023-02-18 NOTE — Patient Instructions (Addendum)

## 2023-02-18 NOTE — Progress Notes (Signed)
Cardiology Office Note:  .   Date:  02/18/2023  ID:  Jonathan Avery, DOB [birth year], MRN [id number] PCP: Carylon Perches, MD  [facility] Providers Cardiologist:  Dina Rich, MD    History of Present Illness: .   Jonathan Avery is a 89 y.o. male with a PMH of CAD, history of MI 20 years ago with PTCA at [facility], hypertension, type 2 diabetes, bradycardia, hyperlipidemia, and carotid artery stenosis, who presents today for scheduled follow-up.  Lopressor stopped in the past due to bradycardia.  Last seen by Dr. Dina Rich on February 17, 2022.  Blood pressure in office that day was 118/56.  Dr. Wyline Mood noted medical management for blood pressure was affected by aggressive BP, wide pulse pressure often with a low DBP's, bradycardia, and renal dysfunction.  Had significant AKI while on chlorthalidone.  Due to these limitations and due to his advanced age, was reasonable with SBP in the 150s and due to concern with his DPP dropping too low.  ED visit on January 05, 2023 at [facility] for weakness and dizziness. Noted lightheadedness with bending over, denied LOC.  Initially presented to urgent care and obtain an EKG, was sent to the ED.  Lab work revealed low magnesium and was given magnesium supplement, patient was ordered meclizine for symptomatic relief.  Chest x-ray was negative for anything acute.  Cardiac monitor revealed sinus rhythm.  Today he presents for overdue 48-month follow-up appointment.  He states he is doing well.  Does have some dizziness that he says is "about going on."  Says he did receive the prescription of meclizine and took some, but medication did not seem to help. Denies any chest pain, shortness of breath, palpitations, syncope, presyncope, orthopnea, PND, swelling or significant weight changes, acute bleeding, or claudication.  ROS: Negative.  See HPI.  Studies Reviewed: Marland Kitchen    EKG: EKG Interpretation Date/Time:  Thursday February 18 2023 11:13:27  EDT Ventricular Rate:  58 PR Interval:  188 QRS Duration:  108 QT Interval:  448 QTC Calculation: 439 R Axis:   43  Text Interpretation: Sinus bradycardia When compared with ECG of 23-Mar-2021 18:32, PREVIOUS ECG IS PRESENT Confirmed by Sharlene Dory [id number]) on 02/18/2023 11:19:30 AM    Carotid duplex 10/2021: Summary:  Right Carotid: Velocities in the right ICA are consistent with a 1-39%  stenosis. Patent CEA.   Left Carotid: Velocities in the left ICA are consistent with a 40-59%  stenosis.   Vertebrals: Bilateral vertebral arteries demonstrate antegrade flow.  Subclavians: Normal flow hemodynamics were seen in bilateral subclavian arteries.   Cardiac Monitor 03/2021: Patch wear time was 6 days and 18hours Predominant rhythm is NSR with average HR 57bpm (ranging 42-141bpm) One run of nonsustained VT lasting 4 beats Six runs of nonsustained SVT with longest 8 beats at 101bpm Rare SVE, rare VE (<1%) No sustained arrhythmias or significant pauses     Patch Wear Time:  6 days and 18 hours (2022-10-31T15:18:10-398 to 2022-11-07T08:43:32-0500)   Patient had a min HR of 42 bpm, max HR of 141 bpm, and avg HR of 57 bpm. Predominant underlying rhythm was Sinus Rhythm. 1 run of Ventricular Tachycardia occurred lasting 4 beats with a max rate of 141 bpm (avg 128 bpm). 6 Supraventricular Tachycardia  runs occurred, the run with the fastest interval lasting 6 beats with a max rate of 128 bpm, the longest lasting 8 beats with an avg rate of 101 bpm. Isolated SVEs were rare (<1.0%), SVE Couplets  were rare (<1.0%), and SVE Triplets were rare (<1.0%).  Isolated VEs were rare (<1.0%, 591), VE Couplets were rare (<1.0%, 11), and VE Triplets were rare (<1.0%, 2). Ventricular Bigeminy was present.   Renal artery duplex 03/2021: Summary:  Largest Aortic Diameter: 2.2 cm    Renal:    Right: Normal size right kidney. Abnormal right Resistive Index.         Abnormal cortical thickness of right  kidney. 1-59% stenosis         of the right renal artery. RRV flow present. Cyst(s) noted.         Avascular cystic mass noted in the mid pole of the right         kidney, measuring 1.3 x 1.2 x 1.3 cm.  Left:  Normal size of left kidney. Abnormal left Resistive Index.         Abnormal cortical thickness of the left kidney. 1-59%         stenosis of the left renal artery. LRV flow present. Cyst(s)         noted. Avascular cystic mass in the mid pole of the left         kidney, measuring 3.8 x 3.6 x 3.9 cm.  Mesenteric:  Normal Celiac artery and Superior Mesenteric artery findings.    Patent IVC.    Lexiscan 03/2018: Blood pressure demonstrated a normal response to exercise. There was no ST segment deviation noted during stress. Findings consistent with prior myocardial infarction. This is an intermediate risk study. The left ventricular ejection fraction is moderately decreased (30-44%).   Large inferior wall infarct from apex to base with no ischemia EF 42%  Echocardiogram 03/2018: Study Conclusions   - Left ventricle: The cavity size was normal. Wall thickness was    normal. Systolic function was normal. The estimated ejection    fraction was in the range of 55% to 60%. Wall motion was normal;    there were no regional wall motion abnormalities. Doppler    parameters are consistent with abnormal left ventricular    relaxation (grade 1 diastolic dysfunction).  - Aortic valve: Mildly calcified annulus. Trileaflet. There was    mild regurgitation.  - Mitral valve: Mildly calcified annulus. There was mild    regurgitation.  - Left atrium: The atrium was mildly dilated.  - Tricuspid valve: There was trivial regurgitation. Peak RV-RA    gradient (S): 29 mm Hg.  - Pulmonary arteries: Systolic pressure could not be accurately    estimated.  - Pericardium, extracardiac: There was no pericardial effusion.  Risk Assessment/Calculations:    HYPERTENSION CONTROL Vitals:   02/18/23  1100 02/18/23 1116 02/18/23 1136  BP: 110/60 (!) 160/70 (!) 157/62    The patient's blood pressure is elevated above target today.  In order to address the patient's elevated BP: Blood pressure will be monitored at home to determine if medication changes need to be made.; Follow up with general cardiology has been recommended.   Repeat manual BP on exam by me: 157/62.     Physical Exam:   VS:  BP (!) 157/62 (BP Location: Left Arm)   Pulse 65   Ht 5\' 9"  (1.753 m)   Wt 193 lb (87.5 kg)   SpO2 95%   BMI 28.50 kg/m    Wt Readings from Last 3 Encounters:  02/18/23 193 lb (87.5 kg)  02/17/22 198 lb 12.8 oz (90.2 kg)  11/19/21 202 lb 12.8 oz (92 kg)    GEN: Well  nourished, well developed in no acute distress NECK: No JVD; No carotid bruits CARDIAC: S1/S2, RRR, no murmurs, rubs, gallops RESPIRATORY:  Clear to auscultation without rales, wheezing or rhonchi  ABDOMEN: Soft, non-tender, non-distended EXTREMITIES:  No edema; No deformity   ASSESSMENT AND PLAN: .    CAD History of MI 20 years ago with PTCA at [facility]. Stable with no anginal symptoms. No indication for ischemic evaluation.  Continue aspirin, olmesartan, and rosuvastatin. Heart healthy diet and regular cardiovascular exercise as tolerated encouraged.   HTN Repeat manual blood pressure on exam, 157/62. Medical management limited d/t wide pulse pressure with hx of low DBP's, bradycardia, and hx of renal dysfunction with significant AKI on chlorthalidone. Dr. Wyline Mood previously recommended that given these limitations and due to his advanced age, SBP in 150s to be reasonable, concerned about his DBP dropping too low.  No medication changes at this time. Heart healthy diet and regular cardiovascular exercise encouraged.   HLD LDL 68 03/2022.  Continue rosuvastatin. Heart healthy diet and regular cardiovascular exercise encouraged.   Carotid artery stenosis Carotid Doppler June/2023 revealed right ICA stenosis at 1 to 39%,  patent CEA.  Left ICA consistent with 40 to 59% stenosis.  Denies any symptoms.  He has been followed by [facility].  Continue current medication regimen. Has upcoming carotid Dopplers scheduled for next month.  Dizziness Has had recent dizziness, says this is almost been resolved.  Meclizine did not seem to make a difference.  Etiology unclear.  Discussed conservative measures.  Care and ED precautions discussed. Continue to follow with PCP.  Dispo: Follow-up with Dr. Dina Rich or APP in 6 months or sooner if anything changes.  Signed, Sharlene Dory, NP

## 2023-02-22 NOTE — Progress Notes (Unsigned)
[facility] [place]  Patient name: Jonathan Avery MRN: [id number] DOB: 12/28/1930 Sex: male  REASON FOR VISIT: Follow-up carotid disease  HPI: Previously: Jonathan Avery is a 89 y.o. male here today for follow-up.  He denies any new neurologic changes.  He is status post right carotid endarterectomy in August 2021.  He had an episode a year ago of expressive aphasia and work-up was negative.  He does have known mild to moderate stenosis in his left internal carotid artery.  He has had no new neurologic deficits.  Specifically no aphasia or TIA or stroke.  He remains quite active at 79.  02/23/23: Patient returns for surveillance of carotid artery stenosis.  He is doing well overall.  He forgot to take his morning blood pressure medicine.  His systolic blood pressure is extremely high.  The patient has no symptoms from hypertension.  He has no headache, abdominal pain, etc.  I counseled the patient to take his morning blood pressure medicine and recheck his blood pressure in an hour, if it is not improved he needs to go to the emergency department for evaluation.  Past Medical History:  Diagnosis Date   BPH (benign prostatic hypertrophy)    Cancer (HCC)    skin cancer - basal   Carotid artery occlusion    Diabetes (HCC)    Type II   High cholesterol    Hypertension    Myocardial infarction [facility])    Peripheral vascular disease (HCC)    Stroke (HCC)      documented 12/28/2019- ? TIA  " weeks 3 weeks ago, expresive aphasia lasted 2 -3 minutes."     Family History  Problem Relation Age of Onset   Ulcers Father    Diabetes type II Brother    Diabetes type II Brother    Colon cancer Neg Hx     SOCIAL HISTORY: Social History   Tobacco Use   Smoking status: Former    Types: Cigars   Smokeless tobacco: Never  Substance Use Topics   Alcohol use: No    Alcohol/week: 0.0 standard drinks of alcohol    No Known  Allergies  Current Outpatient Medications  Medication Sig Dispense Refill   ALPRAZolam (XANAX) 0.25 MG tablet Take 0.25 mg by mouth 2 (two) times daily as needed.     amLODipine (NORVASC) 10 MG tablet TAKE ONE TABLET (10MG  TOTAL) BY MOUTH DAILY 90 tablet 3   aspirin 81 MG tablet Take 81 mg by mouth at bedtime.      Cholecalciferol (VITAMIN D3) 10 MCG (400 UNIT) CAPS Take 800 mg by mouth daily.     cloNIDine (CATAPRES) 0.1 MG tablet TAKE ONE TABLET (0.1MG  TOTAL) BY MOUTH TWO TIMES DAILY 180 tablet 1   hydrALAZINE (APRESOLINE) 100 MG tablet Take 1 tablet (100 mg total) by mouth 3 (three) times daily. Dose change. 180 tablet 5   hydrochlorothiazide (HYDRODIURIL) 12.5 MG tablet Take 12.5 mg by mouth every morning.     LANTUS SOLOSTAR 100 UNIT/ML Solostar Pen Inject 20 Units into the skin daily.     magnesium gluconate (MAGONATE) 500 MG tablet Take 500 mg by mouth daily.     metFORMIN (GLUCOPHAGE) 500 MG tablet Take 500 mg by mouth in the morning and at bedtime.     olmesartan (BENICAR) 40 MG tablet Take 40 mg by mouth daily.     rosuvastatin (CRESTOR) 10 MG tablet Take 10 mg by mouth daily.  tamsulosin (FLOMAX) 0.4 MG CAPS capsule Take 0.4 mg by mouth daily.     vitamin B-12 (CYANOCOBALAMIN) 500 MCG tablet Take 500 mcg by mouth daily.     No current facility-administered medications for this visit.     PHYSICAL EXAM: Vitals:   02/23/23 0903 02/23/23 0906  BP: (!) 215/85 (!) 244/99  Pulse: 69   Resp: 20   Temp: 98.2 F (36.8 C)   SpO2: 100%   Weight: 192 lb (87.1 kg)   Height: 5\' 9"  (1.753 m)      GENERAL: The patient is a well-nourished male, in no acute distress. The vital signs are documented above. CARDIOVASCULAR: Carotid arteries without bruits bilaterally.  Well-healed right carotid incision.  2+ radial pulses. PULMONARY: There is good air exchange  MUSCULOSKELETAL: There are no major deformities or cyanosis. NEUROLOGIC: No focal weakness or paresthesias are  detected. SKIN: There are no ulcers or rashes noted. PSYCHIATRIC: The patient has a normal affect.  DATA:  Carotid duplex 02/23/2023 Right Carotid: Velocities in the right ICA are consistent with a 1-39%  stenosis.   Left Carotid: Velocities in the left ICA are consistent with a 40-59%  stenosis.   Vertebrals: Bilateral vertebral arteries demonstrate antegrade flow.  Subclavians: Normal flow hemodynamics were seen in bilateral subclavian               arteries.   MEDICAL ISSUES: Stable overall.  No evidence of symptomatic carotid disease.  He knows to report immediately to the emergency room should he develop any neurologic deficits. I counseled the patient to take his morning blood pressure medicine and recheck his blood pressure in an hour, if it is not improved he needs to go to the emergency department for evaluation.  Otherwise we will see him in 1 year with repeat carotid duplex  Rande Brunt. Lenell Antu, MD [facility] Phone Number: [phone] 02/23/2023 9:22 AM

## 2023-02-23 ENCOUNTER — Ambulatory Visit (HOSPITAL_COMMUNITY)
Admission: RE | Admit: 2023-02-23 | Discharge: 2023-02-23 | Disposition: A | Discharge status: Home / Self Care | Payer: Medicare HMO | Source: Ambulatory Visit | Attending: Vascular Surgery | Admitting: Vascular Surgery

## 2023-02-23 ENCOUNTER — Ambulatory Visit: Payer: Medicare HMO | Admitting: Vascular Surgery

## 2023-02-23 ENCOUNTER — Encounter: Payer: Self-pay | Admitting: Vascular Surgery

## 2023-02-23 VITALS — BP 244/99 | HR 69 | Temp 98.2°F | Resp 20 | Ht 69.0 in | Wt 192.0 lb

## 2023-02-23 DIAGNOSIS — I6523 Occlusion and stenosis of bilateral carotid arteries: Secondary | ICD-10-CM

## 2023-02-23 DIAGNOSIS — I6529 Occlusion and stenosis of unspecified carotid artery: Secondary | ICD-10-CM

## 2023-03-12 ENCOUNTER — Other Ambulatory Visit: Payer: Self-pay

## 2023-03-12 DIAGNOSIS — I6523 Occlusion and stenosis of bilateral carotid arteries: Secondary | ICD-10-CM

## 2023-03-25 DIAGNOSIS — L570 Actinic keratosis: Secondary | ICD-10-CM | POA: Diagnosis not present

## 2023-03-25 DIAGNOSIS — C44229 Squamous cell carcinoma of skin of left ear and external auricular canal: Secondary | ICD-10-CM | POA: Diagnosis not present

## 2023-03-25 DIAGNOSIS — X32XXXD Exposure to sunlight, subsequent encounter: Secondary | ICD-10-CM | POA: Diagnosis not present

## 2023-03-31 ENCOUNTER — Other Ambulatory Visit: Payer: Self-pay | Admitting: General Surgery

## 2023-03-31 DIAGNOSIS — K862 Cyst of pancreas: Secondary | ICD-10-CM

## 2023-04-16 ENCOUNTER — Other Ambulatory Visit: Payer: Self-pay | Admitting: Cardiology

## 2023-04-19 DIAGNOSIS — I2510 Atherosclerotic heart disease of native coronary artery without angina pectoris: Secondary | ICD-10-CM | POA: Diagnosis not present

## 2023-04-19 DIAGNOSIS — N1832 Chronic kidney disease, stage 3b: Secondary | ICD-10-CM | POA: Diagnosis not present

## 2023-04-19 DIAGNOSIS — E1129 Type 2 diabetes mellitus with other diabetic kidney complication: Secondary | ICD-10-CM | POA: Diagnosis not present

## 2023-04-19 DIAGNOSIS — E785 Hyperlipidemia, unspecified: Secondary | ICD-10-CM | POA: Diagnosis not present

## 2023-04-26 DIAGNOSIS — I10 Essential (primary) hypertension: Secondary | ICD-10-CM | POA: Diagnosis not present

## 2023-04-26 DIAGNOSIS — E785 Hyperlipidemia, unspecified: Secondary | ICD-10-CM | POA: Diagnosis not present

## 2023-04-26 DIAGNOSIS — N1831 Chronic kidney disease, stage 3a: Secondary | ICD-10-CM | POA: Diagnosis not present

## 2023-04-26 DIAGNOSIS — I2510 Atherosclerotic heart disease of native coronary artery without angina pectoris: Secondary | ICD-10-CM | POA: Diagnosis not present

## 2023-04-26 DIAGNOSIS — E1122 Type 2 diabetes mellitus with diabetic chronic kidney disease: Secondary | ICD-10-CM | POA: Diagnosis not present

## 2023-04-26 DIAGNOSIS — Z23 Encounter for immunization: Secondary | ICD-10-CM | POA: Diagnosis not present

## 2023-04-26 DIAGNOSIS — I700 Atherosclerosis of aorta: Secondary | ICD-10-CM | POA: Diagnosis not present

## 2023-04-26 DIAGNOSIS — Z0001 Encounter for general adult medical examination with abnormal findings: Secondary | ICD-10-CM | POA: Diagnosis not present

## 2023-05-15 ENCOUNTER — Ambulatory Visit
Admission: RE | Admit: 2023-05-15 | Discharge: 2023-05-15 | Disposition: A | Discharge status: Home / Self Care | Payer: Medicare HMO | Source: Ambulatory Visit | Attending: General Surgery | Admitting: General Surgery

## 2023-05-15 DIAGNOSIS — K862 Cyst of pancreas: Secondary | ICD-10-CM | POA: Diagnosis not present

## 2023-05-15 DIAGNOSIS — K5730 Diverticulosis of large intestine without perforation or abscess without bleeding: Secondary | ICD-10-CM | POA: Diagnosis not present

## 2023-05-15 MED ORDER — GADOPICLENOL 0.5 MMOL/ML IV SOLN
9.0000 mL | Freq: Once | INTRAVENOUS | Status: AC | PRN
  Administered 2023-05-15: 9 mL via INTRAVENOUS

## 2023-06-03 DIAGNOSIS — X32XXXD Exposure to sunlight, subsequent encounter: Secondary | ICD-10-CM | POA: Diagnosis not present

## 2023-06-03 DIAGNOSIS — L570 Actinic keratosis: Secondary | ICD-10-CM | POA: Diagnosis not present

## 2023-06-03 DIAGNOSIS — Z08 Encounter for follow-up examination after completed treatment for malignant neoplasm: Secondary | ICD-10-CM | POA: Diagnosis not present

## 2023-06-03 DIAGNOSIS — Z85828 Personal history of other malignant neoplasm of skin: Secondary | ICD-10-CM | POA: Diagnosis not present

## 2023-06-03 DIAGNOSIS — C44329 Squamous cell carcinoma of skin of other parts of face: Secondary | ICD-10-CM | POA: Diagnosis not present

## 2023-07-01 DIAGNOSIS — Z85828 Personal history of other malignant neoplasm of skin: Secondary | ICD-10-CM | POA: Diagnosis not present

## 2023-07-01 DIAGNOSIS — Z08 Encounter for follow-up examination after completed treatment for malignant neoplasm: Secondary | ICD-10-CM | POA: Diagnosis not present

## 2023-07-01 DIAGNOSIS — L570 Actinic keratosis: Secondary | ICD-10-CM | POA: Diagnosis not present

## 2023-07-01 DIAGNOSIS — X32XXXD Exposure to sunlight, subsequent encounter: Secondary | ICD-10-CM | POA: Diagnosis not present

## 2023-07-18 ENCOUNTER — Other Ambulatory Visit: Payer: Self-pay

## 2023-07-18 ENCOUNTER — Inpatient Hospital Stay (HOSPITAL_COMMUNITY)
Admission: EM | Admit: 2023-07-18 | Discharge: 2023-07-23 | DRG: 291 | Disposition: A | Discharge status: Home / Self Care | Payer: Medicare HMO | Attending: Family Medicine | Admitting: Family Medicine

## 2023-07-18 ENCOUNTER — Emergency Department (HOSPITAL_COMMUNITY): Payer: Medicare HMO

## 2023-07-18 ENCOUNTER — Encounter (HOSPITAL_COMMUNITY): Payer: Self-pay

## 2023-07-18 DIAGNOSIS — I5033 Acute on chronic diastolic (congestive) heart failure: Secondary | ICD-10-CM | POA: Diagnosis not present

## 2023-07-18 DIAGNOSIS — N1831 Chronic kidney disease, stage 3a: Secondary | ICD-10-CM | POA: Diagnosis present

## 2023-07-18 DIAGNOSIS — I2782 Chronic pulmonary embolism: Secondary | ICD-10-CM | POA: Diagnosis present

## 2023-07-18 DIAGNOSIS — R531 Weakness: Secondary | ICD-10-CM | POA: Diagnosis not present

## 2023-07-18 DIAGNOSIS — Z9181 History of falling: Secondary | ICD-10-CM

## 2023-07-18 DIAGNOSIS — J9601 Acute respiratory failure with hypoxia: Principal | ICD-10-CM | POA: Diagnosis present

## 2023-07-18 DIAGNOSIS — Z833 Family history of diabetes mellitus: Secondary | ICD-10-CM | POA: Diagnosis not present

## 2023-07-18 DIAGNOSIS — R0602 Shortness of breath: Secondary | ICD-10-CM | POA: Diagnosis not present

## 2023-07-18 DIAGNOSIS — Z87891 Personal history of nicotine dependence: Secondary | ICD-10-CM

## 2023-07-18 DIAGNOSIS — I82532 Chronic embolism and thrombosis of left popliteal vein: Secondary | ICD-10-CM | POA: Diagnosis present

## 2023-07-18 DIAGNOSIS — E1322 Other specified diabetes mellitus with diabetic chronic kidney disease: Secondary | ICD-10-CM

## 2023-07-18 DIAGNOSIS — R591 Generalized enlarged lymph nodes: Secondary | ICD-10-CM | POA: Diagnosis not present

## 2023-07-18 DIAGNOSIS — Z1152 Encounter for screening for COVID-19: Secondary | ICD-10-CM | POA: Diagnosis not present

## 2023-07-18 DIAGNOSIS — I252 Old myocardial infarction: Secondary | ICD-10-CM

## 2023-07-18 DIAGNOSIS — I10 Essential (primary) hypertension: Secondary | ICD-10-CM | POA: Diagnosis not present

## 2023-07-18 DIAGNOSIS — R599 Enlarged lymph nodes, unspecified: Secondary | ICD-10-CM | POA: Diagnosis present

## 2023-07-18 DIAGNOSIS — E1122 Type 2 diabetes mellitus with diabetic chronic kidney disease: Secondary | ICD-10-CM | POA: Diagnosis present

## 2023-07-18 DIAGNOSIS — I130 Hypertensive heart and chronic kidney disease with heart failure and stage 1 through stage 4 chronic kidney disease, or unspecified chronic kidney disease: Principal | ICD-10-CM | POA: Diagnosis present

## 2023-07-18 DIAGNOSIS — I2510 Atherosclerotic heart disease of native coronary artery without angina pectoris: Secondary | ICD-10-CM | POA: Diagnosis present

## 2023-07-18 DIAGNOSIS — E1151 Type 2 diabetes mellitus with diabetic peripheral angiopathy without gangrene: Secondary | ICD-10-CM | POA: Diagnosis present

## 2023-07-18 DIAGNOSIS — N179 Acute kidney failure, unspecified: Secondary | ICD-10-CM | POA: Diagnosis not present

## 2023-07-18 DIAGNOSIS — D631 Anemia in chronic kidney disease: Secondary | ICD-10-CM | POA: Diagnosis present

## 2023-07-18 DIAGNOSIS — I509 Heart failure, unspecified: Secondary | ICD-10-CM | POA: Diagnosis not present

## 2023-07-18 DIAGNOSIS — R918 Other nonspecific abnormal finding of lung field: Secondary | ICD-10-CM | POA: Diagnosis not present

## 2023-07-18 DIAGNOSIS — Z7984 Long term (current) use of oral hypoglycemic drugs: Secondary | ICD-10-CM

## 2023-07-18 DIAGNOSIS — I82502 Chronic embolism and thrombosis of unspecified deep veins of left lower extremity: Secondary | ICD-10-CM | POA: Diagnosis not present

## 2023-07-18 DIAGNOSIS — I5020 Unspecified systolic (congestive) heart failure: Secondary | ICD-10-CM | POA: Diagnosis not present

## 2023-07-18 DIAGNOSIS — Z955 Presence of coronary angioplasty implant and graft: Secondary | ICD-10-CM

## 2023-07-18 DIAGNOSIS — J90 Pleural effusion, not elsewhere classified: Secondary | ICD-10-CM | POA: Diagnosis not present

## 2023-07-18 DIAGNOSIS — Z794 Long term (current) use of insulin: Secondary | ICD-10-CM | POA: Diagnosis not present

## 2023-07-18 DIAGNOSIS — I219 Acute myocardial infarction, unspecified: Secondary | ICD-10-CM | POA: Diagnosis present

## 2023-07-18 DIAGNOSIS — E7800 Pure hypercholesterolemia, unspecified: Secondary | ICD-10-CM | POA: Diagnosis present

## 2023-07-18 DIAGNOSIS — I5021 Acute systolic (congestive) heart failure: Secondary | ICD-10-CM | POA: Diagnosis not present

## 2023-07-18 DIAGNOSIS — E1165 Type 2 diabetes mellitus with hyperglycemia: Secondary | ICD-10-CM | POA: Diagnosis not present

## 2023-07-18 DIAGNOSIS — Z7901 Long term (current) use of anticoagulants: Secondary | ICD-10-CM | POA: Diagnosis not present

## 2023-07-18 DIAGNOSIS — Z7982 Long term (current) use of aspirin: Secondary | ICD-10-CM | POA: Diagnosis not present

## 2023-07-18 DIAGNOSIS — R54 Age-related physical debility: Secondary | ICD-10-CM | POA: Diagnosis present

## 2023-07-18 DIAGNOSIS — N400 Enlarged prostate without lower urinary tract symptoms: Secondary | ICD-10-CM | POA: Diagnosis present

## 2023-07-18 DIAGNOSIS — Z79899 Other long term (current) drug therapy: Secondary | ICD-10-CM | POA: Diagnosis not present

## 2023-07-18 DIAGNOSIS — Z8673 Personal history of transient ischemic attack (TIA), and cerebral infarction without residual deficits: Secondary | ICD-10-CM

## 2023-07-18 DIAGNOSIS — Z85828 Personal history of other malignant neoplasm of skin: Secondary | ICD-10-CM | POA: Diagnosis not present

## 2023-07-18 DIAGNOSIS — R0902 Hypoxemia: Secondary | ICD-10-CM | POA: Diagnosis not present

## 2023-07-18 DIAGNOSIS — R0600 Dyspnea, unspecified: Principal | ICD-10-CM

## 2023-07-18 DIAGNOSIS — N183 Chronic kidney disease, stage 3 unspecified: Secondary | ICD-10-CM | POA: Diagnosis present

## 2023-07-18 DIAGNOSIS — E042 Nontoxic multinodular goiter: Secondary | ICD-10-CM | POA: Diagnosis not present

## 2023-07-18 DIAGNOSIS — E119 Type 2 diabetes mellitus without complications: Secondary | ICD-10-CM

## 2023-07-18 DIAGNOSIS — Z89021 Acquired absence of right finger(s): Secondary | ICD-10-CM

## 2023-07-18 LAB — CBC WITH DIFFERENTIAL/PLATELET
Abs Immature Granulocytes: 0.05 10*3/uL (ref 0.00–0.07)
Basophils Absolute: 0 10*3/uL (ref 0.0–0.1)
Basophils Relative: 0 %
Eosinophils Absolute: 0.1 10*3/uL (ref 0.0–0.5)
Eosinophils Relative: 1 %
HCT: 31.1 % — ABNORMAL LOW (ref 39.0–52.0)
Hemoglobin: 10.2 g/dL — ABNORMAL LOW (ref 13.0–17.0)
Immature Granulocytes: 1 %
Lymphocytes Relative: 11 %
Lymphs Abs: 1 10*3/uL (ref 0.7–4.0)
MCH: 30.7 pg (ref 26.0–34.0)
MCHC: 32.8 g/dL (ref 30.0–36.0)
MCV: 93.7 fL (ref 80.0–100.0)
Monocytes Absolute: 0.6 10*3/uL (ref 0.1–1.0)
Monocytes Relative: 6 %
Neutro Abs: 7.3 10*3/uL (ref 1.7–7.7)
Neutrophils Relative %: 81 %
Platelets: 128 10*3/uL — ABNORMAL LOW (ref 150–400)
RBC: 3.32 MIL/uL — ABNORMAL LOW (ref 4.22–5.81)
RDW: 12.9 % (ref 11.5–15.5)
WBC: 8.9 10*3/uL (ref 4.0–10.5)
nRBC: 0 % (ref 0.0–0.2)

## 2023-07-18 LAB — COMPREHENSIVE METABOLIC PANEL
ALT: 9 U/L (ref 0–44)
AST: 17 U/L (ref 15–41)
Albumin: 3.1 g/dL — ABNORMAL LOW (ref 3.5–5.0)
Alkaline Phosphatase: 50 U/L (ref 38–126)
Anion gap: 9 (ref 5–15)
BUN: 36 mg/dL — ABNORMAL HIGH (ref 8–23)
CO2: 18 mmol/L — ABNORMAL LOW (ref 22–32)
Calcium: 8.4 mg/dL — ABNORMAL LOW (ref 8.9–10.3)
Chloride: 105 mmol/L (ref 98–111)
Creatinine, Ser: 1.92 mg/dL — ABNORMAL HIGH (ref 0.61–1.24)
GFR, Estimated: 33 mL/min — ABNORMAL LOW (ref >60)
Glucose, Bld: 209 mg/dL — ABNORMAL HIGH (ref 70–99)
Potassium: 4.8 mmol/L (ref 3.5–5.1)
Sodium: 132 mmol/L — ABNORMAL LOW (ref 135–145)
Total Bilirubin: 1.2 mg/dL (ref 0.0–1.2)
Total Protein: 6.3 g/dL — ABNORMAL LOW (ref 6.5–8.1)

## 2023-07-18 LAB — URINALYSIS, W/ REFLEX TO CULTURE (INFECTION SUSPECTED)
Bacteria, UA: NONE SEEN
Bilirubin Urine: NEGATIVE
Glucose, UA: 50 mg/dL — AB
Hgb urine dipstick: NEGATIVE
Ketones, ur: NEGATIVE mg/dL
Leukocytes,Ua: NEGATIVE
Nitrite: NEGATIVE
Protein, ur: 100 mg/dL — AB
Specific Gravity, Urine: 1.03 (ref 1.005–1.030)
pH: 5 (ref 5.0–8.0)

## 2023-07-18 LAB — RESP PANEL BY RT-PCR (RSV, FLU A&B, COVID)  RVPGX2
Influenza A by PCR: NEGATIVE
Influenza B by PCR: NEGATIVE
Resp Syncytial Virus by PCR: NEGATIVE
SARS Coronavirus 2 by RT PCR: NEGATIVE

## 2023-07-18 LAB — PROCALCITONIN: Procalcitonin: 0.11 ng/mL

## 2023-07-18 LAB — HEMOGLOBIN A1C
Hgb A1c MFr Bld: 7.1 % — ABNORMAL HIGH (ref 4.8–5.6)
Mean Plasma Glucose: 157.07 mg/dL

## 2023-07-18 LAB — CBG MONITORING, ED: Glucose-Capillary: 220 mg/dL — ABNORMAL HIGH (ref 70–99)

## 2023-07-18 LAB — TROPONIN I (HIGH SENSITIVITY)
Troponin I (High Sensitivity): 101 ng/L (ref <18)
Troponin I (High Sensitivity): 103 ng/L (ref <18)

## 2023-07-18 LAB — GLUCOSE, CAPILLARY: Glucose-Capillary: 220 mg/dL — ABNORMAL HIGH (ref 70–99)

## 2023-07-18 LAB — BRAIN NATRIURETIC PEPTIDE: B Natriuretic Peptide: 639 pg/mL — ABNORMAL HIGH (ref 0.0–100.0)

## 2023-07-18 LAB — D-DIMER, QUANTITATIVE: D-Dimer, Quant: 1.13 ug{FEU}/mL — ABNORMAL HIGH (ref 0.00–0.50)

## 2023-07-18 MED ORDER — GABAPENTIN 300 MG PO CAPS
300.0000 mg | ORAL_CAPSULE | Freq: Every day | ORAL | Status: DC
  Administered 2023-07-18 – 2023-07-22 (×5): 300 mg via ORAL
  Filled 2023-07-18 (×5): qty 1

## 2023-07-18 MED ORDER — SODIUM CHLORIDE 0.9 % IV SOLN: 500.0000 mg | INTRAVENOUS | Status: DC

## 2023-07-18 MED ORDER — ONDANSETRON HCL 4 MG/2ML IJ SOLN: 4.0000 mg | Freq: Four times a day (QID) | INTRAMUSCULAR | Status: DC | PRN

## 2023-07-18 MED ORDER — ACETAMINOPHEN 650 MG RE SUPP: 650.0000 mg | Freq: Four times a day (QID) | RECTAL | Status: DC | PRN

## 2023-07-18 MED ORDER — SODIUM CHLORIDE 0.9 % IV BOLUS
500.0000 mL | Freq: Once | INTRAVENOUS | Status: AC
  Administered 2023-07-18: 500 mL via INTRAVENOUS

## 2023-07-18 MED ORDER — INSULIN ASPART 100 UNIT/ML IJ SOLN
0.0000 [IU] | Freq: Three times a day (TID) | INTRAMUSCULAR | Status: DC
  Administered 2023-07-19 (×2): 3 [IU] via SUBCUTANEOUS
  Administered 2023-07-20 (×2): 2 [IU] via SUBCUTANEOUS
  Administered 2023-07-20 – 2023-07-21 (×2): 3 [IU] via SUBCUTANEOUS
  Administered 2023-07-21 – 2023-07-22 (×3): 2 [IU] via SUBCUTANEOUS
  Administered 2023-07-22: 5 [IU] via SUBCUTANEOUS
  Administered 2023-07-22 – 2023-07-23 (×2): 2 [IU] via SUBCUTANEOUS

## 2023-07-18 MED ORDER — CLONIDINE HCL 0.1 MG PO TABS
0.1000 mg | ORAL_TABLET | Freq: Two times a day (BID) | ORAL | Status: DC
  Administered 2023-07-18 – 2023-07-23 (×10): 0.1 mg via ORAL
  Filled 2023-07-18 (×11): qty 1

## 2023-07-18 MED ORDER — AMLODIPINE BESYLATE 5 MG PO TABS
10.0000 mg | ORAL_TABLET | Freq: Every day | ORAL | Status: DC
  Administered 2023-07-19 – 2023-07-23 (×5): 10 mg via ORAL
  Filled 2023-07-18 (×5): qty 2

## 2023-07-18 MED ORDER — ROSUVASTATIN CALCIUM 10 MG PO TABS
10.0000 mg | ORAL_TABLET | Freq: Every day | ORAL | Status: DC
Start: 2023-07-19 — End: 2023-07-23
  Administered 2023-07-19 – 2023-07-23 (×5): 10 mg via ORAL
  Filled 2023-07-18 (×5): qty 1

## 2023-07-18 MED ORDER — SODIUM CHLORIDE 0.9 % IV SOLN: 2.0000 g | INTRAVENOUS | Status: DC

## 2023-07-18 MED ORDER — SODIUM CHLORIDE 0.9 % IV SOLN
500.0000 mg | Freq: Once | INTRAVENOUS | Status: AC
  Administered 2023-07-18: 500 mg via INTRAVENOUS
  Filled 2023-07-18: qty 5

## 2023-07-18 MED ORDER — INSULIN GLARGINE-YFGN 100 UNIT/ML ~~LOC~~ SOLN
10.0000 [IU] | Freq: Every day | SUBCUTANEOUS | Status: DC
  Administered 2023-07-18 – 2023-07-22 (×5): 10 [IU] via SUBCUTANEOUS
  Filled 2023-07-18 (×6): qty 0.1

## 2023-07-18 MED ORDER — ONDANSETRON HCL 4 MG PO TABS: 4.0000 mg | ORAL_TABLET | Freq: Four times a day (QID) | ORAL | Status: DC | PRN

## 2023-07-18 MED ORDER — ASPIRIN 81 MG PO TBEC
81.0000 mg | DELAYED_RELEASE_TABLET | Freq: Every day | ORAL | Status: DC
  Administered 2023-07-18 – 2023-07-22 (×5): 81 mg via ORAL
  Filled 2023-07-18 (×5): qty 1

## 2023-07-18 MED ORDER — HYDRALAZINE HCL 50 MG PO TABS
100.0000 mg | ORAL_TABLET | Freq: Three times a day (TID) | ORAL | Status: DC
Start: 2023-07-18 — End: 2023-07-23
  Administered 2023-07-18 – 2023-07-23 (×14): 100 mg via ORAL
  Filled 2023-07-18 (×14): qty 2

## 2023-07-18 MED ORDER — IOHEXOL 350 MG/ML SOLN
75.0000 mL | Freq: Once | INTRAVENOUS | Status: AC | PRN
  Administered 2023-07-18: 75 mL via INTRAVENOUS

## 2023-07-18 MED ORDER — TAMSULOSIN HCL 0.4 MG PO CAPS
0.4000 mg | ORAL_CAPSULE | Freq: Every day | ORAL | Status: DC
  Administered 2023-07-19 – 2023-07-23 (×5): 0.4 mg via ORAL
  Filled 2023-07-18 (×5): qty 1

## 2023-07-18 MED ORDER — ACETAMINOPHEN 325 MG PO TABS: 650.0000 mg | ORAL_TABLET | Freq: Four times a day (QID) | ORAL | Status: DC | PRN

## 2023-07-18 MED ORDER — INSULIN ASPART 100 UNIT/ML IJ SOLN
0.0000 [IU] | Freq: Every day | INTRAMUSCULAR | Status: DC
  Administered 2023-07-18 – 2023-07-22 (×2): 2 [IU] via SUBCUTANEOUS

## 2023-07-18 MED ORDER — SODIUM CHLORIDE 0.9 % IV SOLN
2.0000 g | Freq: Once | INTRAVENOUS | Status: AC
  Administered 2023-07-18: 2 g via INTRAVENOUS
  Filled 2023-07-18: qty 20

## 2023-07-18 MED ORDER — FUROSEMIDE 10 MG/ML IJ SOLN
40.0000 mg | Freq: Two times a day (BID) | INTRAMUSCULAR | Status: DC
Start: 2023-07-18 — End: 2023-07-21
  Administered 2023-07-18 – 2023-07-21 (×6): 40 mg via INTRAVENOUS
  Filled 2023-07-18 (×6): qty 4

## 2023-07-18 MED ORDER — POLYETHYLENE GLYCOL 3350 17 G PO PACK: 17.0000 g | PACK | Freq: Every day | ORAL | Status: DC | PRN

## 2023-07-18 MED ORDER — ENOXAPARIN SODIUM 40 MG/0.4ML IJ SOSY
40.0000 mg | PREFILLED_SYRINGE | INTRAMUSCULAR | Status: DC
Start: 2023-07-18 — End: 2023-07-19
  Administered 2023-07-18: 40 mg via SUBCUTANEOUS
  Filled 2023-07-18: qty 0.4

## 2023-07-18 NOTE — Assessment & Plan Note (Addendum)
 Creatinine 1.92, baseline CKD stage IIIa.  Last creatinine 6 months ago was 1.3 otherwise baseline appears to be 1.7-2. -Monitor with diuresis and contrast exposure

## 2023-07-18 NOTE — Assessment & Plan Note (Signed)
 Systolic 140s to 161W. -Resume clonidine, hydralazine -Holding olmesartan for contrast exposure -Monitor with diuresis

## 2023-07-18 NOTE — H&P (Signed)
 History and Physical    [name]:[id number] DOB: [birth year] DOA: 07/18/2023  PCP: Carylon Perches, MD   Patient coming from: Home  I have personally briefly reviewed patient's old medical records in [facility]  Chief Complaint: Difficulty Breathing.  HPI: Jonathan Avery is a 89 y.o. male with medical history significant for diabetes mellitus, hypertension, myocardial infarction, CKD 3 , BPH. Patient presented to the ED with complaints of difficulty breathing over the past 2 to 3 days, present at rest and with exertion.  He reports generalized weakness.  He has stable unchanged mild chronic cough.  No chest pain.  He also reports bilateral lower extremity swelling over the past 2 to 3 weeks, and swelling of his upper extremities.  He reports similar swelling in the past that improved with the medication his outpatient provider gave him, reports he still takes it. No abdominal bloating.  Quit smoking cigarettes over 50 years ago. Patient lives alone.  No memory problems.  ED Course: Temperature 98.4.  Heart rate 67-82.  Respiratory 20-24.  Blood pressure systolic 140s to 811.  O2 sats dropped to 87 to 88% while on 4 L, requiring increased to 5 L with sats greater than 91% WBC 8.9.  COVID influenza and RSV negative.  Creatinine 1.9.   Troponin 101 >> 103. D-dimer 1.13. CTA chest shows-tiny weblike nonocclusive thrombus within the distal aspect of the lobar branch artery of right lower lobe compatible with chronic PE.  Also predominant groundglass opacity within bilateral lower lobes left worse than right-pulmonary edema or atypical viral infection.  Multiple mildly enlarged lymph nodes likely reactive. 500 mL bolus. Ceftriaxone and azithromycin given. BNP later returned at 639 and procalcitonin of 0.11. Hospitalist to admit for acute hypoxic respiratory failure.   Review of Systems: As per HPI all other systems reviewed and negative.  Past Medical History:  Diagnosis Date   BPH  (benign prostatic hypertrophy)    Cancer (HCC)    skin cancer - basal   Carotid artery occlusion    Diabetes (HCC)    Type II   High cholesterol    Hypertension    Myocardial infarction [facility])    Peripheral vascular disease (HCC)    Stroke (HCC)      documented 12/28/2019- ? TIA  " weeks 3 weeks ago, expresive aphasia lasted 2 -3 minutes."     Past Surgical History:  Procedure Laterality Date   AMPUTATION Right 09/12/2021   Procedure: AMPUTATION DIGIT;  Surgeon: Vickki Hearing, MD;  Location: [facility] ORS;  Service: Orthopedics;  Laterality: Right;   CARDIAC CATHETERIZATION     CHOLECYSTECTOMY  1973   COLONOSCOPY  07/20/2006   Dr. [name]:Status post right hemicolectomy. Residual colonic mucosa appeared normal, normal rectum.    COLONOSCOPY  2006/2007   sprawling villous adenoma at ileocecal valve   COLONOSCOPY  2011   Dr. Jena Gauss: normal rectum, pancolonic diverticulosis, 2 diminutive polyps, with path benign polypoid colonic mucosa   COLONOSCOPY N/A 01/02/2015   Procedure: COLONOSCOPY;  Surgeon: Corbin Ade, MD;  Location: [facility] ENDO SUITE;  Service: Endoscopy;  Laterality: N/A;  1245   CORONARY ANGIOPLASTY     ENDARTERECTOMY Right 12/29/2019   Procedure: ENDARTERECTOMY CAROTID;  Surgeon: Larina Earthly, MD;  Location: [facility];  Service: Vascular;  Laterality: Right;   ESOPHAGOGASTRODUODENOSCOPY (EGD) WITH PROPOFOL N/A 01/13/2018   Procedure: ESOPHAGOGASTRODUODENOSCOPY (EGD) WITH PROPOFOL;  Surgeon: Rachael Fee, MD;  Location: [facility] ENDOSCOPY;  Service: Endoscopy;  Laterality: N/A;  EUS N/A 01/13/2018   Procedure: UPPER ENDOSCOPIC ULTRASOUND (EUS) RADIAL;  Surgeon: Rachael Fee, MD;  Location: [facility] ENDOSCOPY;  Service: Endoscopy;  Laterality: N/A;   FINE NEEDLE ASPIRATION N/A 01/13/2018   Procedure: FINE NEEDLE ASPIRATION (FNA) LINEAR;  Surgeon: Rachael Fee, MD;  Location: [facility] ENDOSCOPY;  Service: Endoscopy;  Laterality: N/A;   open hemicolectomy  2007   due to villous adenoma   PATCH  ANGIOPLASTY Right 12/29/2019   Procedure: RIGHT CAROTID PATCH ANGIOPLASTY USING HEMASHIELD PLATINUM FINESSE;  Surgeon: Larina Earthly, MD;  Location: [facility] OR;  Service: Vascular;  Laterality: Right;     reports that he has quit smoking. His smoking use included cigars. He has never used smokeless tobacco. He reports that he does not drink alcohol and does not use drugs.  No Known Allergies  Family History  Problem Relation Age of Onset   Ulcers Father    Diabetes type II Brother    Diabetes type II Brother    Colon cancer Neg Hx     Prior to Admission medications   Medication Sig Start Date End Date Taking? Authorizing Provider  ALPRAZolam (XANAX) 0.25 MG tablet Take 0.25 mg by mouth 2 (two) times daily as needed. 02/09/22   [provider]  amLODipine (NORVASC) 10 MG tablet TAKE ONE TABLET (10MG  TOTAL) BY MOUTH DAILY 04/19/23   Antoine Poche, MD  aspirin 81 MG tablet Take 81 mg by mouth at bedtime.     [provider]  Cholecalciferol (VITAMIN D3) 10 MCG (400 UNIT) CAPS Take 800 mg by mouth daily.    [provider]  cloNIDine (CATAPRES) 0.1 MG tablet TAKE ONE TABLET (0.1MG  TOTAL) BY MOUTH TWO TIMES DAILY 02/15/23   Antoine Poche, MD  hydrALAZINE (APRESOLINE) 100 MG tablet Take 1 tablet (100 mg total) by mouth 3 (three) times daily. Dose change. 02/18/23   Sharlene Dory, NP  hydrochlorothiazide (HYDRODIURIL) 12.5 MG tablet Take 12.5 mg by mouth every morning. 10/15/21   [provider]  LANTUS SOLOSTAR 100 UNIT/ML Solostar Pen Inject 20 Units into the skin daily. 11/27/14   [provider]  magnesium gluconate (MAGONATE) 500 MG tablet Take 500 mg by mouth daily.    [provider]  metFORMIN (GLUCOPHAGE) 500 MG tablet Take 500 mg by mouth in the morning and at bedtime.    [provider]  olmesartan (BENICAR) 40 MG tablet Take 40 mg by mouth daily. 09/09/20   [provider]  rosuvastatin (CRESTOR) 10 MG tablet Take  10 mg by mouth daily.    [provider]  tamsulosin (FLOMAX) 0.4 MG CAPS capsule Take 0.4 mg by mouth daily. 11/05/14   [provider]  vitamin B-12 (CYANOCOBALAMIN) 500 MCG tablet Take 500 mcg by mouth daily.    [provider]    Physical Exam: Vitals:   07/18/23 1053 07/18/23 1300 07/18/23 1526  BP: (!) 161/57 (!) 140/57   Pulse: 82 67   Resp: 20 (!) 24   Temp: 98.4 F (36.9 C)  98.1 F (36.7 C)  TempSrc: Oral  Oral  SpO2: 92% 91%     Constitutional: NAD, calm, comfortable Vitals:   07/18/23 1053 07/18/23 1300 07/18/23 1526  BP: (!) 161/57 (!) 140/57   Pulse: 82 67   Resp: 20 (!) 24   Temp: 98.4 F (36.9 C)  98.1 F (36.7 C)  TempSrc: Oral  Oral  SpO2: 92% 91%    Eyes: PERRL, lids and conjunctivae normal  ENMT: Mucous membranes are moist.  Neck: normal, supple, no masses, no thyromegaly Respiratory: clear to auscultation bilaterally, no wheezing, no crackles. Normal respiratory effort. No accessory muscle use.  Cardiovascular: Regular rate and rhythm, no murmurs / rubs / gallops.  Trace bilateral pitting lower extremity edema to mid leg  Abdomen: no tenderness, no masses palpated. No hepatosplenomegaly. Bowel sounds positive.  Musculoskeletal: no clubbing / cyanosis. No joint deformity upper and lower extremities.  Skin: no rashes, lesions, ulcers. No induration Neurologic: No facial asymmetry, moving extremities spontaneously, speech fluent Psychiatric: Normal judgment and insight. Alert and oriented x 3. Normal mood.   Labs on Admission: I have personally reviewed following labs and imaging studies  CBC: Recent Labs  Lab 07/18/23 1147  WBC 8.9  NEUTROABS 7.3  HGB 10.2*  HCT 31.1*  MCV 93.7  PLT 128*   Basic Metabolic Panel: Recent Labs  Lab 07/18/23 1147  NA 132*  K 4.8  CL 105  CO2 18*  GLUCOSE 209*  BUN 36*  CREATININE 1.92*  CALCIUM 8.4*   GFR: CrCl cannot be calculated (Unknown ideal weight.). Liver Function  Tests: Recent Labs  Lab 07/18/23 1147  AST 17  ALT 9  ALKPHOS 50  BILITOT 1.2  PROT 6.3*  ALBUMIN 3.1*   Radiological Exams on Admission: CT Angio Chest PE W and/or Wo Contrast Result Date: 07/18/2023 CLINICAL DATA:  Pulmonary embolism (PE) suspected, low to intermediate prob, positive D-dimer EXAM: CT ANGIOGRAPHY CHEST WITH CONTRAST TECHNIQUE: Multidetector CT imaging of the chest was performed using the standard protocol during bolus administration of intravenous contrast. Multiplanar CT image reconstructions and MIPs were obtained to evaluate the vascular anatomy. RADIATION DOSE REDUCTION: This exam was performed according to the departmental dose-optimization program which includes automated exposure control, adjustment of the mA and/or kV according to patient size and/or use of iterative reconstruction technique. CONTRAST:  75mL OMNIPAQUE IOHEXOL 350 MG/ML SOLN COMPARISON:  02/07/2008 FINDINGS: Cardiovascular: Adequate opacification of the pulmonary arteries. Single tiny web-like nonocclusive thrombus within the distal aspect of the lobar branch artery of the right lower lobe (series 5, image 164). No additional pulmonary arterial filling defects. Pulmonary trunk measures 3.5 cm in diameter. Thoracic aorta is nonaneurysmal. Scattered atherosclerotic vascular calcifications of the aorta and coronary arteries. Heart size is upper limits of normal. No significant pericardial fluid. Mediastinum/Nodes: Multiple mildly enlarged mediastinal lymph nodes. Reference nodes include 12 mm AP window node (series 4, image 36), 10 mm precarinal node (series 4, image 38), and 14 mm subcarinal node, (series 4, image 46). Mildly enlarged right hilar lymph node measuring 12 mm (series 4, image 45). No enlarged axillary or left hilar lymph nodes. Multinodular thyroid gland with largest node on the right measuring up to 2.3 cm. Trachea and esophagus demonstrate no significant abnormality. Lungs/Pleura: Small layering  bilateral pleural effusions. Predominantly ground-glass airspace opacity within the bilateral lower lobes, left worse than right. Minimal ground-glass opacity within the left upper lobe and lingula. 6 mm subpleural nodule in the periphery of the right middle lobe (series 6, image 78), stable since 2009 and does not require follow-up imaging. No new pulmonary nodules. No pneumothorax. Upper Abdomen: No acute abnormality. Musculoskeletal: No acute osseous abnormality. Findings of diffuse idiopathic skeletal hyperostosis. Degenerative changes of both shoulders. No significant chest wall abnormality. Review of the MIP images confirms the above findings. IMPRESSION: 1. Single tiny web-like nonocclusive thrombus within the distal aspect of the lobar branch artery of the right lower lobe, compatible with residua of a  chronic pulmonary embolism. No additional pulmonary arterial filling defects. 2. Predominantly ground-glass airspace opacity within the bilateral lower lobes, left worse than right. Minimal ground-glass opacity within the left upper lobe and lingula. This could represent pulmonary edema or atypical/viral infection. 3. Small layering bilateral pleural effusions. 4. Multiple mildly enlarged mediastinal and right hilar lymph nodes, likely reactive. 5. Multinodular thyroid gland with largest node on the right measuring up to 2.3 cm. Recommend nonemergent thyroid US (ref: J Am Coll Radiol. 2015 Feb;12(2): 143-50). 6. Aortic and coronary artery atherosclerosis (ICD10-I70.0). Electronically Signed   By: Duanne Guess D.O.   On: 07/18/2023 14:32   DG Chest Portable 1 View Result Date: 07/18/2023 CLINICAL DATA:  Weakness, short of breath EXAM: PORTABLE CHEST 1 VIEW COMPARISON:  None Available. FINDINGS: Normal mediastinum and cardiac silhouette. Normal pulmonary vasculature. No evidence of effusion, infiltrate, or pneumothorax. No acute bony abnormality. Nipple shadow projects over the LEFT costophrenic angle.  IMPRESSION: No acute cardiopulmonary process. Electronically Signed   By: Genevive Bi M.D.   On: 07/18/2023 11:49    EKG: Independently reviewed.  Sinus rhythm, rate 80, QTc 436, no significant change from prior.  Assessment/Plan Principal Problem:   Acute hypoxic respiratory failure (HCC) Active Problems:   Acute congestive heart failure (HCC)   Diabetes (HCC)   Essential hypertension   Myocardial infarction (HCC)   CKD (chronic kidney disease), stage III (HCC)  Assessment and Plan: * Acute hypoxic respiratory failure (HCC) O2 sats down to 87 to 88% on 4 L currently on 5 L.  Likely secondary to pulmonary edema, question possible differential versus concomitant pulmonary infection.  Procalcitonin 0.11.  He is afebrile without leukocytosis, reports generalized weakness.  CT also shows single tiny weblike nonocclusive thrombus compatible with chronic PE.  Patient is mildly active by daughter. -IV Lasix for now -IV antibiotics started in ED, low threshold to discontinue further antibiotics -Considering patient's advanced age, and he lives alone, he has not been started on anticoagulation.  Acute congestive heart failure (HCC) Type as yet unspecified.  Presenting with new bilateral lower extremity swelling, hypoxic, BNP elevated at 639 (no prior to compare).  CT chest-pulmonary L >> R, bilateral lower lobe airspace opacity.  Edema versus atypical viral infection.  500 mL bolus given likely contributed to hypoxia. Procalcitonin- 0.11.  Last echo on file from 2019 EF 55 to 60% grade 1 DD. -IV Lasix 40 twice daily -Strict input output, daily weight, daily BMP -Echocardiogram  CKD (chronic kidney disease), stage III (HCC) Creatinine 1.92, baseline CKD stage IIIa.  Last creatinine 6 months ago was 1.3 otherwise baseline appears to be 1.7-2. -Monitor with diuresis and contrast exposure  Myocardial infarction [facility]) Denies chest pain, troponin 101  >> 103.  History of MI 20 years ago.  No  records in epic. -Follow-up echo -Resume aspirin, Plavix, statin  Essential hypertension Systolic 140s to 409W. -Resume clonidine, hydralazine -Holding olmesartan for contrast exposure -Monitor with diuresis  Diabetes (HCC) - SSI- S - HgBa1c -Hold home metformin -On Lantus   DVT prophylaxis: Lovenox Code Status: Full Code Family Communication: Daughter- Sherry at bedside Disposition Plan: ~/> 2 days Consults called: None Admission status: Inpt Tele I certify that at the point of admission it is my clinical judgment that the patient will require inpatient hospital care spanning beyond 2 midnights from the point of admission due to high intensity of service, high risk for further deterioration and high frequency of surveillance required.    Author: Onnie Boer, MD  07/18/2023 5:08 PM  For on call review [email or web address].

## 2023-07-18 NOTE — ED Triage Notes (Signed)
 Pt c/o sob that has been going on for the past few days, also having weakness with it. Denies any chest pain , n/v/d.

## 2023-07-18 NOTE — Progress Notes (Signed)
 Patient arrived to room. Alert and oriented x4. VSS. On 4L oxygen. Call bell within reach and bed alarm on.

## 2023-07-18 NOTE — Assessment & Plan Note (Signed)
 Denies chest pain, troponin 101  >> 103.  History of MI 20 years ago.  No records in epic. -Follow-up echo -Resume aspirin, Plavix, statin

## 2023-07-18 NOTE — ED Notes (Signed)
 This RN checked on patient oxygen saturation dropping to 87-88% on 4L. This RN increased oxygen to 5L 92% nasal cannula. EDP notified.

## 2023-07-18 NOTE — Assessment & Plan Note (Signed)
 Type as yet unspecified.  Presenting with new bilateral lower extremity swelling, hypoxic, BNP elevated at 639 (no prior to compare).  CT chest-pulmonary L >> R, bilateral lower lobe airspace opacity.  Edema versus atypical viral infection.  500 mL bolus given likely contributed to hypoxia. Procalcitonin- 0.11.  Last echo on file from 2019 EF 55 to 60% grade 1 DD. -IV Lasix 40 twice daily -Strict input output, daily weight, daily BMP -Echocardiogram

## 2023-07-18 NOTE — Assessment & Plan Note (Signed)
 O2 sats down to 87 to 88% on 4 L currently on 5 L.  Likely secondary to pulmonary edema, question possible differential versus concomitant pulmonary infection.  Procalcitonin 0.11.  He is afebrile without leukocytosis, reports generalized weakness.  CT also shows single tiny weblike nonocclusive thrombus compatible with chronic PE.  Patient is mildly active by daughter. -IV Lasix for now -IV antibiotics started in ED, low threshold to discontinue further antibiotics -Considering patient's advanced age, and he lives alone, he has not been started on anticoagulation.

## 2023-07-18 NOTE — Assessment & Plan Note (Signed)
-   SSI- S - HgBa1c -Hold home metformin -On Lantus

## 2023-07-18 NOTE — ED Provider Notes (Addendum)
 [place] [facility] AT [facility] Provider Note   CSN: [id number] Arrival date & time: 07/18/23  1041     History  Chief Complaint  Patient presents with   Shortness of Breath    Jonathan Avery is a 89 y.o. male.   Shortness of Breath Patient presents with fatigue and shortness of breath.  Says does not have the energy that he normally does.  Has been eating less.  No fevers.  No cough.  No definite sick contacts.  No changes in medicines.  No diarrhea.  No dysuria.  No lateralizing changes.    Past Medical History:  Diagnosis Date   BPH (benign prostatic hypertrophy)    Cancer (HCC)    skin cancer - basal   Carotid artery occlusion    Diabetes (HCC)    Type II   High cholesterol    Hypertension    Myocardial infarction [facility])    Peripheral vascular disease (HCC)    Stroke (HCC)      documented 12/28/2019- ? TIA  " weeks 3 weeks ago, expresive aphasia lasted 2 -3 minutes."     Home Medications Prior to Admission medications   Medication Sig Start Date End Date Taking? Authorizing Provider  ALPRAZolam (XANAX) 0.25 MG tablet Take 0.25 mg by mouth 2 (two) times daily as needed. 02/09/22   [provider]  amLODipine (NORVASC) 10 MG tablet TAKE ONE TABLET (10MG  TOTAL) BY MOUTH DAILY 04/19/23   Antoine Poche, MD  aspirin 81 MG tablet Take 81 mg by mouth at bedtime.     [provider]  Cholecalciferol (VITAMIN D3) 10 MCG (400 UNIT) CAPS Take 800 mg by mouth daily.    [provider]  cloNIDine (CATAPRES) 0.1 MG tablet TAKE ONE TABLET (0.1MG  TOTAL) BY MOUTH TWO TIMES DAILY 02/15/23   Antoine Poche, MD  hydrALAZINE (APRESOLINE) 100 MG tablet Take 1 tablet (100 mg total) by mouth 3 (three) times daily. Dose change. 02/18/23   Sharlene Dory, NP  hydrochlorothiazide (HYDRODIURIL) 12.5 MG tablet Take 12.5 mg by mouth every morning. 10/15/21   [provider]  LANTUS SOLOSTAR 100 UNIT/ML Solostar Pen Inject 20 Units  into the skin daily. 11/27/14   [provider]  magnesium gluconate (MAGONATE) 500 MG tablet Take 500 mg by mouth daily.    [provider]  metFORMIN (GLUCOPHAGE) 500 MG tablet Take 500 mg by mouth in the morning and at bedtime.    [provider]  olmesartan (BENICAR) 40 MG tablet Take 40 mg by mouth daily. 09/09/20   [provider]  rosuvastatin (CRESTOR) 10 MG tablet Take 10 mg by mouth daily.    [provider]  tamsulosin (FLOMAX) 0.4 MG CAPS capsule Take 0.4 mg by mouth daily. 11/05/14   [provider]  vitamin B-12 (CYANOCOBALAMIN) 500 MCG tablet Take 500 mcg by mouth daily.    [provider]      Allergies    Patient has no known allergies.    Review of Systems   Review of Systems  Respiratory:  Positive for shortness of breath.     Physical Exam Updated Vital Signs BP (!) 140/57   Pulse 67   Temp 98.4 F (36.9 C) (Oral)   Resp (!) 24   SpO2 91%  Physical Exam Vitals and nursing note reviewed.  HENT:     Head: Atraumatic.  Cardiovascular:     Rate and Rhythm: Normal rate and regular rhythm.  Pulmonary:     Breath sounds: No wheezing or rales.  Chest:     Chest wall: No tenderness.  Abdominal:     Tenderness: There is no abdominal tenderness.  Skin:    Capillary Refill: Capillary refill takes less than 2 seconds.  Neurological:     Mental Status: He is alert and oriented to person, place, and time.     ED Results / Procedures / Treatments   Labs (all labs ordered are listed, but only abnormal results are displayed) Labs Reviewed  COMPREHENSIVE METABOLIC PANEL - Abnormal; Notable for the following components:      Result Value   Sodium 132 (*)    CO2 18 (*)    Glucose, Bld 209 (*)    BUN 36 (*)    Creatinine, Ser 1.92 (*)    Calcium 8.4 (*)    Total Protein 6.3 (*)    Albumin 3.1 (*)    GFR, Estimated 33 (*)    All other components within normal limits  CBC WITH DIFFERENTIAL/PLATELET -  Abnormal; Notable for the following components:   RBC 3.32 (*)    Hemoglobin 10.2 (*)    HCT 31.1 (*)    Platelets 128 (*)    All other components within normal limits  D-DIMER, QUANTITATIVE - Abnormal; Notable for the following components:   D-Dimer, Quant 1.13 (*)    All other components within normal limits  TROPONIN I (HIGH SENSITIVITY) - Abnormal; Notable for the following components:   Troponin I (High Sensitivity) 101 (*)    All other components within normal limits  TROPONIN I (HIGH SENSITIVITY) - Abnormal; Notable for the following components:   Troponin I (High Sensitivity) 103 (*)    All other components within normal limits  RESP PANEL BY RT-PCR (RSV, FLU A&B, COVID)  RVPGX2  URINALYSIS, W/ REFLEX TO CULTURE (INFECTION SUSPECTED)  BRAIN NATRIURETIC PEPTIDE  PROCALCITONIN    EKG EKG Interpretation Date/Time:  Sunday July 18 2023 11:00:21 EST Ventricular Rate:  80 PR Interval:  190 QRS Duration:  122 QT Interval:  378 QTC Calculation: 436 R Axis:   66  Text Interpretation: Sinus rhythm Nonspecific intraventricular conduction delay Nonspecific repol abnormality, diffuse leads Baseline wander in lead(s) V6 Confirmed by Benjiman Core [phone]) on 07/18/2023 11:02:54 AM  Radiology CT Angio Chest PE W and/or Wo Contrast Result Date: 07/18/2023 CLINICAL DATA:  Pulmonary embolism (PE) suspected, low to intermediate prob, positive D-dimer EXAM: CT ANGIOGRAPHY CHEST WITH CONTRAST TECHNIQUE: Multidetector CT imaging of the chest was performed using the standard protocol during bolus administration of intravenous contrast. Multiplanar CT image reconstructions and MIPs were obtained to evaluate the vascular anatomy. RADIATION DOSE REDUCTION: This exam was performed according to the departmental dose-optimization program which includes automated exposure control, adjustment of the mA and/or kV according to patient size and/or use of iterative reconstruction technique. CONTRAST:   75mL OMNIPAQUE IOHEXOL 350 MG/ML SOLN COMPARISON:  02/07/2008 FINDINGS: Cardiovascular: Adequate opacification of the pulmonary arteries. Single tiny web-like nonocclusive thrombus within the distal aspect of the lobar branch artery of the right lower lobe (series 5, image 164). No additional pulmonary arterial filling defects. Pulmonary trunk measures 3.5 cm in diameter. Thoracic aorta is nonaneurysmal. Scattered atherosclerotic vascular calcifications of the aorta and coronary arteries. Heart size is upper limits of normal. No significant pericardial fluid. Mediastinum/Nodes: Multiple mildly enlarged mediastinal lymph nodes. Reference nodes include 12 mm AP window node (series 4, image 36), 10 mm precarinal node (series 4, image 38), and  14 mm subcarinal node, (series 4, image 46). Mildly enlarged right hilar lymph node measuring 12 mm (series 4, image 45). No enlarged axillary or left hilar lymph nodes. Multinodular thyroid gland with largest node on the right measuring up to 2.3 cm. Trachea and esophagus demonstrate no significant abnormality. Lungs/Pleura: Small layering bilateral pleural effusions. Predominantly ground-glass airspace opacity within the bilateral lower lobes, left worse than right. Minimal ground-glass opacity within the left upper lobe and lingula. 6 mm subpleural nodule in the periphery of the right middle lobe (series 6, image 78), stable since 2009 and does not require follow-up imaging. No new pulmonary nodules. No pneumothorax. Upper Abdomen: No acute abnormality. Musculoskeletal: No acute osseous abnormality. Findings of diffuse idiopathic skeletal hyperostosis. Degenerative changes of both shoulders. No significant chest wall abnormality. Review of the MIP images confirms the above findings. IMPRESSION: 1. Single tiny web-like nonocclusive thrombus within the distal aspect of the lobar branch artery of the right lower lobe, compatible with residua of a chronic pulmonary embolism. No  additional pulmonary arterial filling defects. 2. Predominantly ground-glass airspace opacity within the bilateral lower lobes, left worse than right. Minimal ground-glass opacity within the left upper lobe and lingula. This could represent pulmonary edema or atypical/viral infection. 3. Small layering bilateral pleural effusions. 4. Multiple mildly enlarged mediastinal and right hilar lymph nodes, likely reactive. 5. Multinodular thyroid gland with largest node on the right measuring up to 2.3 cm. Recommend nonemergent thyroid US (ref: J Am Coll Radiol. 2015 Feb;12(2): 143-50). 6. Aortic and coronary artery atherosclerosis (ICD10-I70.0). Electronically Signed   By: Duanne Guess D.O.   On: 07/18/2023 14:32   DG Chest Portable 1 View Result Date: 07/18/2023 CLINICAL DATA:  Weakness, short of breath EXAM: PORTABLE CHEST 1 VIEW COMPARISON:  None Available. FINDINGS: Normal mediastinum and cardiac silhouette. Normal pulmonary vasculature. No evidence of effusion, infiltrate, or pneumothorax. No acute bony abnormality. Nipple shadow projects over the LEFT costophrenic angle. IMPRESSION: No acute cardiopulmonary process. Electronically Signed   By: Genevive Bi M.D.   On: 07/18/2023 11:49    Procedures Procedures    Medications Ordered in ED Medications  iohexol (OMNIPAQUE) 350 MG/ML injection 75 mL (75 mLs Intravenous Contrast Given 07/18/23 1345)  sodium chloride 0.9 % bolus 500 mL (500 mLs Intravenous Bolus 07/18/23 1417)    ED Course/ Medical Decision Making/ A&P                                 Medical Decision Making Amount and/or Complexity of Data Reviewed Labs: ordered. Radiology: ordered.  Risk Prescription drug management.   Patient with generalized weakness.  Differential diagnosis along with includes cause such as infection, electrolyte abnormalities, dehydration.  Will get basic blood work chest x-ray urinalysis.  CBC showed mild anemia.  No blood in the stool or black  stools.  CMP does show creatinine mildly elevated.  1.91 6 months ago it was 1.35.  Had previously been elevated but thought to be related to medication he was on that he is no longer on.  Troponin however came back elevated.  EKG reassuring.  Also found to have some hypoxia here.  On nasal cannula oxygen which is improved.  Not really coughing.  D-dimer done and was elevated.  CT scan done and showed old pulm embolism but nothing acute.  Has also edema versus pneumonia.  Will treat for pneumonia but BNP added and also procalcitonin.  With hypoxia will  admit to hospitalist.  Nurse just informed me that has been requiring more oxygen.  Now up to 5 L.  CRITICAL CARE Performed by: Benjiman Core Total critical care time: 30 minutes Critical care time was exclusive of separately billable procedures and treating other patients. Critical care was necessary to treat or prevent imminent or life-threatening deterioration. Critical care was time spent personally by me on the following activities: development of treatment plan with patient and/or surrogate as well as nursing, discussions with consultants, evaluation of patient's response to treatment, examination of patient, obtaining history from patient or surrogate, ordering and performing treatments and interventions, ordering and review of laboratory studies, ordering and review of radiographic studies, pulse oximetry and re-evaluation of patient's condition.         Final Clinical Impression(s) / ED Diagnoses Final diagnoses:  Dyspnea, unspecified type  Hypoxia    Rx / DC Orders ED Discharge Orders     None         Benjiman Core, MD 07/18/23 1520    Benjiman Core, MD 07/18/23 [phone]

## 2023-07-19 DIAGNOSIS — J9601 Acute respiratory failure with hypoxia: Secondary | ICD-10-CM | POA: Diagnosis not present

## 2023-07-19 LAB — BASIC METABOLIC PANEL
Anion gap: 6 (ref 5–15)
BUN: 38 mg/dL — ABNORMAL HIGH (ref 8–23)
CO2: 21 mmol/L — ABNORMAL LOW (ref 22–32)
Calcium: 8.1 mg/dL — ABNORMAL LOW (ref 8.9–10.3)
Chloride: 109 mmol/L (ref 98–111)
Creatinine, Ser: 1.93 mg/dL — ABNORMAL HIGH (ref 0.61–1.24)
GFR, Estimated: 32 mL/min — ABNORMAL LOW (ref >60)
Glucose, Bld: 102 mg/dL — ABNORMAL HIGH (ref 70–99)
Potassium: 4.4 mmol/L (ref 3.5–5.1)
Sodium: 136 mmol/L (ref 135–145)

## 2023-07-19 LAB — GLUCOSE, CAPILLARY
Glucose-Capillary: 96 mg/dL (ref 70–99)
Glucose-Capillary: 169 mg/dL — ABNORMAL HIGH (ref 70–99)
Glucose-Capillary: 183 mg/dL — ABNORMAL HIGH (ref 70–99)
Glucose-Capillary: 183 mg/dL — ABNORMAL HIGH (ref 70–99)

## 2023-07-19 MED ORDER — ENOXAPARIN SODIUM 30 MG/0.3ML IJ SOSY
30.0000 mg | PREFILLED_SYRINGE | INTRAMUSCULAR | Status: DC
  Administered 2023-07-19 – 2023-07-22 (×4): 30 mg via SUBCUTANEOUS
  Filled 2023-07-19 (×4): qty 0.3

## 2023-07-19 NOTE — TOC Initial Note (Signed)
 Transition of Care [facility]) - Initial/Assessment Note    Patient Details  Name: Jonathan Avery MRN: [id number] Date of Birth: 23-Jul-1930  [facility] CM/SW Contact:    Beather Arbour Phone Number: 07/19/2023, 12:51 PM  Clinical Narrative:                  CSW spoke with patient and daughter at bedside and assessed him. Patient lives alone , has no equipment in the home, and still able to drive. CSW then assessed patient for CHF because of heart failure consult. Patient is being followed by a cardiologist , does not weigh himself, eat what he can, and take all his prescribe medication. Patient and daughter was open to CSW adding Heart Failure information to AVS. CSW did ask pt and daughter if they would like a a nurse to come out to the home regarding Heart Failure education and patient declined at this time. TOC will continue to follow.   Expected Discharge Plan: Home/Self Care Barriers to Discharge: Continued Medical Work up   Patient Goals and CMS Choice Patient states their goals for this hospitalization and ongoing recovery are:: DC back home CMS Medicare.gov Compare Post Acute Care list provided to:: Patient Choice offered to / list presented to : Patient [place] ownership interest in [facility].provided to:: Patient    Expected Discharge Plan and Services In-house Referral: Clinical Social Work Discharge Planning Services: CM Consult   Living arrangements for the past 2 months: Single Family Home                 DME Arranged: N/A DME Agency: NA         HH Agency: NA        Prior Living Arrangements/Services Living arrangements for the past 2 months: Single Family Home Lives with:: Self Patient language and need for interpreter reviewed:: Yes Do you feel safe going back to the place where you live?: Yes      Need for Family Participation in Patient Care: Yes (Comment) Care giver support system in place?: Yes (comment)   Criminal  Activity/Legal Involvement Pertinent to Current Situation/Hospitalization: No - Comment as needed  Activities of Daily Living   ADL Screening (condition at time of admission) Independently performs ADLs?: Yes (appropriate for developmental age) Is the patient deaf or have difficulty hearing?: No Does the patient have difficulty seeing, even when wearing glasses/contacts?: No Does the patient have difficulty concentrating, remembering, or making decisions?: No  Permission Sought/Granted      Share Information with NAME: Halsey and [name]     Permission granted to share info w Relationship: Patient and daughter     Emotional Assessment Appearance:: Appears stated age   Affect (typically observed): Accepting, Appropriate Orientation: : Oriented to Self, Oriented to Place, Oriented to  Time, Oriented to Situation Alcohol / Substance Use: Not Applicable Psych Involvement: No (comment)  Admission diagnosis:  Hypoxia [R09.02] Dyspnea, unspecified type [R06.00] Acute hypoxic respiratory failure (HCC) [J96.01] Patient Active Problem List   Diagnosis Date Noted   Acute hypoxic respiratory failure (HCC) 07/18/2023   Acute congestive heart failure (HCC) 07/18/2023   Amputation, finger, traumatic    Hypertensive emergency 05/30/2020   Hypertensive encephalopathy 05/30/2020   Asymptomatic carotid artery stenosis without infarction, right 12/29/2019   Pancreatic mass    Diabetes (HCC) 11/29/2017   Essential hypertension 11/29/2017   Dyslipidemia 11/29/2017   Myocardial infarction (HCC) 11/29/2017   BPH (benign prostatic hyperplasia) 11/29/2017  SBO (small bowel obstruction) (HCC) 11/29/2017   CKD (chronic kidney disease), stage III (HCC) 11/29/2017   Hx of colonic polyps    History of colonic polyps 10/10/2009   PCP:  Carylon Perches, MD Pharmacy:   [facility] [place], [place] - [address] [place] [address] Phone: [phone] Fax:  [phone]  [facility] - [place], [place] - [address] [place] [address] Phone: [phone] Fax: [phone]     Social Drivers of Health (SDOH) Social History: SDOH Screenings   Food Insecurity: No Food Insecurity (07/18/2023)  Housing: Low Risk  (07/18/2023)  Transportation Needs: No Transportation Needs (07/18/2023)  Utilities: Not At Risk (07/18/2023)  Social Connections: Moderately Isolated (07/19/2023)  Tobacco Use: Medium Risk (07/18/2023)   SDOH Interventions:     Readmission Risk Interventions    07/19/2023   12:48 PM  Readmission Risk Prevention Plan  Transportation Screening Complete  Home Care Screening Complete  Medication Review (RN CM) Complete

## 2023-07-19 NOTE — Progress Notes (Signed)
 PROGRESS NOTE    Jonathan Avery  ZOX:[id number] DOB: [birth year] DOA: 07/18/2023 PCP: Carylon Perches, MD  Chief Complaint  Patient presents with   Shortness of Breath    Hospital Course:  Jonathan Avery is 89 y.o. male with diabetes, hypertension, prior MI CKD stage III, BPH.  He presents with shortness of breath over the last 3 days, as well as bilateral lower extremity swelling over the last 3 weeks.  In the ED patient was found to be tachypneic, O2 sats 87% on 4 L creatinine 1.9.  Troponin 101 with repeat to 103.  D-dimer 1.13.  CTA chest shows nonocclusive thrombus within the distal aspect of the lobar branch of the right lower lobe compatible with a chronic PE, also reveals predominant groundglass opacities and mildly enlarged lymph nodes.  Patient received a 500 cc bolus.  He was initiated on ceftriaxone and azithromycin.  BNP 639, procalcitonin 0.11.  Patient was admitted to [facility] for acute hypoxic respiratory failure  Subjective: Pt reports feeling better this AM, his daughter is at bedside.    Objective: Vitals:   07/19/23 0025 07/19/23 0426 07/19/23 0629 07/19/23 0721  BP: (!) 113/44 (!) 125/54  (!) 146/57  Pulse: 68 68  78  Resp: (!) 21 (!) 21  20  Temp: 97.8 F (36.6 C) 98.8 F (37.1 C)  97.7 F (36.5 C)  TempSrc: Oral Oral  Oral  SpO2: 91% (!) 89% 92% 94%  Weight:  93.6 kg    Height:        Intake/Output Summary (Last 24 hours) at 07/19/2023 0759 Last data filed at 07/18/2023 1804 Gross per 24 hour  Intake 450 ml  Output --  Net 450 ml   Filed Weights   07/18/23 1624 07/18/23 2008 07/19/23 0426  Weight: 87.1 kg 93.6 kg 93.6 kg    Examination: General exam: Appears calm and comfortable, NAD  Respiratory system: No work of breathing, symmetric chest wall expansion, mild crackles at lung bases Cardiovascular system: S1 & S2 heard, RRR.  Gastrointestinal system: Abdomen is nondistended, soft and nontender.  Neuro: Alert and oriented. No focal neurological  deficits. Extremities: Symmetric, expected ROM, no pedal edema Skin: No rashes, lesions Psychiatry: Demonstrates appropriate judgement and insight. Mood & affect appropriate for situation.   Assessment & Plan:  Principal Problem:   Acute hypoxic respiratory failure (HCC) Active Problems:   Acute congestive heart failure (HCC)   Diabetes (HCC)   Essential hypertension   Myocardial infarction (HCC)   CKD (chronic kidney disease), stage III (HCC)   Acute hypoxic respiratory failure - Continue supplemental oxygen, wean as tolerated - This is all likely secondary to pulmonary edema, there was question of CAP however procal is less than 1, he is afebrile without leukocytosis.  Will discontinue antibiotics. - CT also reveals nonocclusive thrombus compatible with chronic PE, unlikely contributing to hypoxia - Continue with IV diuretics for now -- Wean as tolerated  Acute on chronic heart failure with preserved EF - Last echocardiogram 2019 EF 55 to 60%, grade 1 diastolic dysfunction - Echocardiogram ordered and pending - BNP elevated at 639 on arrival, chest CT with pulmonary edema left greater than right.  Patient did initially receive 500 cc bolus on arrival due to concerns of sepsis, suspect this may have worsened the picture - Continue with IV Lasix twice daily, taper to p.o. as tolerated - Follow strict I's and O's  Chronic pulmonary embolism - Incidentally seen on CT - Not likely contributing to current clinical  picture - Have discussed risks and benefits of anticoagulation with the patient and his family and they have opted to monitor without anticoagulation for now.  They report he is high risk for falls and lives alone.  Have discussed urgent ER return precautions in the event of signs/symptoms new PE or DVT outpatient.  CKD stage IIIa - Baseline creatinine close to 1.3, 1.92 on arrival - Continue to monitor closely with diuresis and contrast exposure - I's and O's as  above  History of myocardial infarction - Currently denies any chest pain - Prior MI over 20 years ago, no records available for review - Will follow-up on echo - Troponin on arrival 101->103.  Doubt acute ACS.  More likely troponin leak from CHF exacerbation as above - Continue with home dose aspirin, Plavix, statin  Hypertension - Resume home meds - Hold ARB for now given AKI  Diabetes, with hyperglycemia - Hemoglobin A1c 7.1% - Continue sliding scale insulin for now, titrate up as tolerated - Will hold home dose metformin while hospitalized    DVT prophylaxis: Lovenox   Code Status: Full Code Family Communication:  Discussed directly with patient, his daughter at bedside and son on the phone Disposition:  Inpatient still hospitalized for supplemental O2 and work up, will discharge to home when on room air  Consultants:    Procedures:    Antimicrobials:  Anti-infectives (From admission, onward)    Start     Dose/Rate Route Frequency Ordered Stop   07/19/23 1600  cefTRIAXone (ROCEPHIN) 2 g in sodium chloride 0.9 % 100 mL IVPB        2 g 200 mL/hr over 30 Minutes Intravenous Every 24 hours 07/18/23 2010 07/24/23 1559   07/19/23 1500  azithromycin (ZITHROMAX) 500 mg in sodium chloride 0.9 % 250 mL IVPB        500 mg 250 mL/hr over 60 Minutes Intravenous Every 24 hours 07/18/23 2010 07/24/23 1459   07/18/23 1530  cefTRIAXone (ROCEPHIN) 2 g in sodium chloride 0.9 % 100 mL IVPB        2 g 200 mL/hr over 30 Minutes Intravenous  Once 07/18/23 1521 07/18/23 1804   07/18/23 1530  azithromycin (ZITHROMAX) 500 mg in sodium chloride 0.9 % 250 mL IVPB        500 mg 250 mL/hr over 60 Minutes Intravenous  Once 07/18/23 1521 07/18/23 1737       Data Reviewed: I have personally reviewed following labs and imaging studies CBC: Recent Labs  Lab 07/18/23 1147  WBC 8.9  NEUTROABS 7.3  HGB 10.2*  HCT 31.1*  MCV 93.7  PLT 128*   Basic Metabolic Panel: Recent Labs  Lab  07/18/23 1147 07/19/23 0434  NA 132* 136  K 4.8 4.4  CL 105 109  CO2 18* 21*  GLUCOSE 209* 102*  BUN 36* 38*  CREATININE 1.92* 1.93*  CALCIUM 8.4* 8.1*   GFR: Estimated Creatinine Clearance: 28.7 mL/min (A) (by C-G formula based on SCr of 1.93 mg/dL (H)). Liver Function Tests: Recent Labs  Lab 07/18/23 1147  AST 17  ALT 9  ALKPHOS 50  BILITOT 1.2  PROT 6.3*  ALBUMIN 3.1*   CBG: Recent Labs  Lab 07/18/23 1847 07/18/23 2014 07/19/23 0719  GLUCAP 220* 220* 96    Recent Results (from the past 240 hours)  Resp panel by RT-PCR (RSV, Flu A&B, Covid) Anterior Nasal Swab     Status: None   Collection Time: 07/18/23 11:47 AM   Specimen: Anterior Nasal  Swab  Result Value Ref Range Status   SARS Coronavirus 2 by RT PCR NEGATIVE NEGATIVE Final    Comment: (NOTE) SARS-CoV-2 target nucleic acids are NOT DETECTED.  The SARS-CoV-2 RNA is generally detectable in upper respiratory specimens during the acute phase of infection. The lowest concentration of SARS-CoV-2 viral copies this assay can detect is 138 copies/mL. A negative result does not preclude SARS-Cov-2 infection and should not be used as the sole basis for treatment or other patient management decisions. A negative result may occur with  improper specimen collection/handling, submission of specimen other than nasopharyngeal swab, presence of viral mutation(s) within the areas targeted by this assay, and inadequate number of viral copies(<138 copies/mL). A negative result must be combined with clinical observations, patient history, and epidemiological information. The expected result is Negative.  Fact Sheet for Patients:  BloggerCourse.com  Fact Sheet for Healthcare Providers:  SeriousBroker.it  This test is no t yet approved or cleared by the [place] FDA and  has been authorized for detection and/or diagnosis of SARS-CoV-2 by FDA under an Emergency Use  Authorization (EUA). This EUA will remain  in effect (meaning this test can be used) for the duration of the COVID-19 declaration under Section 564(b)(1) of the Act, 21 U.S.C.section 360bbb-3(b)(1), unless the authorization is terminated  or revoked sooner.       Influenza A by PCR NEGATIVE NEGATIVE Final   Influenza B by PCR NEGATIVE NEGATIVE Final    Comment: (NOTE) The Xpert Xpress SARS-CoV-2/FLU/RSV plus assay is intended as an aid in the diagnosis of influenza from Nasopharyngeal swab specimens and should not be used as a sole basis for treatment. Nasal washings and aspirates are unacceptable for Xpert Xpress SARS-CoV-2/FLU/RSV testing.  Fact Sheet for Patients: BloggerCourse.com  Fact Sheet for Healthcare Providers: SeriousBroker.it  This test is not yet approved or cleared by the [place] FDA and has been authorized for detection and/or diagnosis of SARS-CoV-2 by FDA under an Emergency Use Authorization (EUA). This EUA will remain in effect (meaning this test can be used) for the duration of the COVID-19 declaration under Section 564(b)(1) of the Act, 21 U.S.C. section 360bbb-3(b)(1), unless the authorization is terminated or revoked.     Resp Syncytial Virus by PCR NEGATIVE NEGATIVE Final    Comment: (NOTE) Fact Sheet for Patients: BloggerCourse.com  Fact Sheet for Healthcare Providers: SeriousBroker.it  This test is not yet approved or cleared by the [place] FDA and has been authorized for detection and/or diagnosis of SARS-CoV-2 by FDA under an Emergency Use Authorization (EUA). This EUA will remain in effect (meaning this test can be used) for the duration of the COVID-19 declaration under Section 564(b)(1) of the Act, 21 U.S.C. section 360bbb-3(b)(1), unless the authorization is terminated or revoked.  Performed at [facility], [address]  [place], [place] [address]      Radiology Studies: CT Angio Chest PE W and/or Wo Contrast Result Date: 07/18/2023 CLINICAL DATA:  Pulmonary embolism (PE) suspected, low to intermediate prob, positive D-dimer EXAM: CT ANGIOGRAPHY CHEST WITH CONTRAST TECHNIQUE: Multidetector CT imaging of the chest was performed using the standard protocol during bolus administration of intravenous contrast. Multiplanar CT image reconstructions and MIPs were obtained to evaluate the vascular anatomy. RADIATION DOSE REDUCTION: This exam was performed according to the departmental dose-optimization program which includes automated exposure control, adjustment of the mA and/or kV according to patient size and/or use of iterative reconstruction technique. CONTRAST:  75mL OMNIPAQUE IOHEXOL 350 MG/ML SOLN COMPARISON:  02/07/2008 FINDINGS: Cardiovascular: Adequate opacification of the pulmonary arteries. Single tiny web-like nonocclusive thrombus within the distal aspect of the lobar branch artery of the right lower lobe (series 5, image 164). No additional pulmonary arterial filling defects. Pulmonary trunk measures 3.5 cm in diameter. Thoracic aorta is nonaneurysmal. Scattered atherosclerotic vascular calcifications of the aorta and coronary arteries. Heart size is upper limits of normal. No significant pericardial fluid. Mediastinum/Nodes: Multiple mildly enlarged mediastinal lymph nodes. Reference nodes include 12 mm AP window node (series 4, image 36), 10 mm precarinal node (series 4, image 38), and 14 mm subcarinal node, (series 4, image 46). Mildly enlarged right hilar lymph node measuring 12 mm (series 4, image 45). No enlarged axillary or left hilar lymph nodes. Multinodular thyroid gland with largest node on the right measuring up to 2.3 cm. Trachea and esophagus demonstrate no significant abnormality. Lungs/Pleura: Small layering bilateral pleural effusions. Predominantly ground-glass airspace opacity within the bilateral lower  lobes, left worse than right. Minimal ground-glass opacity within the left upper lobe and lingula. 6 mm subpleural nodule in the periphery of the right middle lobe (series 6, image 78), stable since 2009 and does not require follow-up imaging. No new pulmonary nodules. No pneumothorax. Upper Abdomen: No acute abnormality. Musculoskeletal: No acute osseous abnormality. Findings of diffuse idiopathic skeletal hyperostosis. Degenerative changes of both shoulders. No significant chest wall abnormality. Review of the MIP images confirms the above findings. IMPRESSION: 1. Single tiny web-like nonocclusive thrombus within the distal aspect of the lobar branch artery of the right lower lobe, compatible with residua of a chronic pulmonary embolism. No additional pulmonary arterial filling defects. 2. Predominantly ground-glass airspace opacity within the bilateral lower lobes, left worse than right. Minimal ground-glass opacity within the left upper lobe and lingula. This could represent pulmonary edema or atypical/viral infection. 3. Small layering bilateral pleural effusions. 4. Multiple mildly enlarged mediastinal and right hilar lymph nodes, likely reactive. 5. Multinodular thyroid gland with largest node on the right measuring up to 2.3 cm. Recommend nonemergent thyroid US (ref: J Am Coll Radiol. 2015 Feb;12(2): 143-50). 6. Aortic and coronary artery atherosclerosis (ICD10-I70.0). Electronically Signed   By: Duanne Guess D.O.   On: 07/18/2023 14:32   DG Chest Portable 1 View Result Date: 07/18/2023 CLINICAL DATA:  Weakness, short of breath EXAM: PORTABLE CHEST 1 VIEW COMPARISON:  None Available. FINDINGS: Normal mediastinum and cardiac silhouette. Normal pulmonary vasculature. No evidence of effusion, infiltrate, or pneumothorax. No acute bony abnormality. Nipple shadow projects over the LEFT costophrenic angle. IMPRESSION: No acute cardiopulmonary process. Electronically Signed   By: Genevive Bi M.D.   On:  07/18/2023 11:49    Scheduled Meds:  amLODipine  10 mg Oral Daily   aspirin EC  81 mg Oral QHS   cloNIDine  0.1 mg Oral BID   enoxaparin (LOVENOX) injection  40 mg Subcutaneous Q24H   furosemide  40 mg Intravenous Q12H   gabapentin  300 mg Oral QHS   hydrALAZINE  100 mg Oral TID   insulin aspart  0-15 Units Subcutaneous TID WC   insulin aspart  0-5 Units Subcutaneous QHS   insulin glargine-yfgn  10 Units Subcutaneous QHS   rosuvastatin  10 mg Oral Daily   tamsulosin  0.4 mg Oral Daily   Continuous Infusions:  azithromycin     cefTRIAXone (ROCEPHIN)  IV       LOS: 1 day    Total time spent coordinating care:   Debarah Crape, DO [facility]  To contact  the attending physician between 7A-7P please use Epic Chat. To contact the covering physician during after hours 7P-7A, please review Amion.   07/19/2023, 7:59 AM   *This document has been created with the assistance of dictation software. Please excuse typographical errors. *

## 2023-07-19 NOTE — Care Management Important Message (Signed)
 Important Message  Patient Details  Name: Jonathan Avery MRN: [id number] Date of Birth: Dec 31, 1930   Important Message Given:  N/A - LOS <3 / Initial given by admissions     Corey Harold 07/19/2023, 10:16 AM

## 2023-07-19 NOTE — Plan of Care (Signed)

## 2023-07-19 NOTE — Plan of Care (Signed)
   Problem: Activity: Goal: Risk for activity intolerance will decrease Outcome: Progressing   Problem: Coping: Goal: Level of anxiety will decrease Outcome: Progressing

## 2023-07-20 ENCOUNTER — Inpatient Hospital Stay (HOSPITAL_COMMUNITY): Payer: Medicare HMO

## 2023-07-20 DIAGNOSIS — J9601 Acute respiratory failure with hypoxia: Secondary | ICD-10-CM

## 2023-07-20 LAB — ECHOCARDIOGRAM COMPLETE
Area-P 1/2: 3.39 cm2
Calc EF: 34.4 %
Est EF: 25
Height: 69 in
S' Lateral: 3.8 cm
Single Plane A2C EF: 34.9 %
Single Plane A4C EF: 33.9 %
Weight: 3262.81 [oz_av]

## 2023-07-20 LAB — GLUCOSE, CAPILLARY
Glucose-Capillary: 141 mg/dL — ABNORMAL HIGH (ref 70–99)
Glucose-Capillary: 181 mg/dL — ABNORMAL HIGH (ref 70–99)
Glucose-Capillary: 141 mg/dL — ABNORMAL HIGH (ref 70–99)
Glucose-Capillary: 170 mg/dL — ABNORMAL HIGH (ref 70–99)

## 2023-07-20 LAB — BASIC METABOLIC PANEL
Anion gap: 9 (ref 5–15)
BUN: 45 mg/dL — ABNORMAL HIGH (ref 8–23)
CO2: 21 mmol/L — ABNORMAL LOW (ref 22–32)
Calcium: 8 mg/dL — ABNORMAL LOW (ref 8.9–10.3)
Chloride: 106 mmol/L (ref 98–111)
Creatinine, Ser: 2.14 mg/dL — ABNORMAL HIGH (ref 0.61–1.24)
GFR, Estimated: 29 mL/min — ABNORMAL LOW (ref >60)
Glucose, Bld: 141 mg/dL — ABNORMAL HIGH (ref 70–99)
Potassium: 4.4 mmol/L (ref 3.5–5.1)
Sodium: 136 mmol/L (ref 135–145)

## 2023-07-20 MED ORDER — MELATONIN 3 MG PO TABS
6.0000 mg | ORAL_TABLET | Freq: Once | ORAL | Status: DC
  Filled 2023-07-20: qty 2

## 2023-07-20 MED ORDER — PERFLUTREN LIPID MICROSPHERE
1.0000 mL | INTRAVENOUS | Status: AC | PRN
  Administered 2023-07-20: 3 mL via INTRAVENOUS

## 2023-07-20 NOTE — Progress Notes (Signed)
 PROGRESS NOTE    Jonathan Avery  VHQ:[id number] DOB: 08-24-1930 DOA: 07/18/2023 PCP: Carylon Perches, MD  Chief Complaint  Patient presents with   Shortness of Breath    Hospital Course:  Jonathan Avery is 89 y.o. male with diabetes, hypertension, prior MI CKD stage III, BPH.  He presents with shortness of breath over the last 3 days, as well as bilateral lower extremity swelling over the last 3 weeks.  In the ED patient was found to be tachypneic, O2 sats 87% on 4 L creatinine 1.9.  Troponin 101 with repeat to 103.  D-dimer 1.13.  CTA chest shows nonocclusive thrombus within the distal aspect of the lobar branch of the right lower lobe compatible with a chronic PE, also reveals predominant groundglass opacities and mildly enlarged lymph nodes.  Patient received a 500 cc bolus.  He was initiated on ceftriaxone and azithromycin.  BNP 639, procalcitonin 0.11.  Patient was admitted to [facility] for acute hypoxic respiratory failure  Subjective: No acute events overnight. On evaluation today patient reports feeling mildly better. Still some SOB and fatigue.  Objective: Vitals:   07/20/23 0534 07/20/23 0634 07/20/23 0924 07/20/23 1335  BP: (!) 145/57  (!) 143/61 (!) 127/42  Pulse: 74   74  Resp: (!) 22   20  Temp: 97.9 F (36.6 C)   98.5 F (36.9 C)  TempSrc: Oral   Oral  SpO2: (!) 87% 95%  95%  Weight:      Height:        Intake/Output Summary (Last 24 hours) at 07/20/2023 1558 Last data filed at 07/20/2023 1202 Gross per 24 hour  Intake 312 ml  Output 700 ml  Net -388 ml   Filed Weights   07/18/23 2008 07/19/23 0426 07/20/23 0500  Weight: 93.6 kg 93.6 kg 92.5 kg    Examination: General exam: Appears calm and comfortable, NAD  Respiratory system: No work of breathing, symmetric chest wall expansion, mild crackles at lung bases Cardiovascular system: S1 & S2 heard, RRR.  Gastrointestinal system: Abdomen is nondistended, soft and nontender.  Neuro: Alert and oriented. No focal neurological  deficits. Extremities: Symmetric, expected ROM, 1+ pitting edema Skin: No rashes, lesions Psychiatry: Demonstrates appropriate judgement and insight. Mood & affect appropriate for situation.   Assessment & Plan:  Principal Problem:   Acute hypoxic respiratory failure (HCC) Active Problems:   Acute congestive heart failure (HCC)   Diabetes (HCC)   Essential hypertension   Myocardial infarction (HCC)   CKD (chronic kidney disease), stage III (HCC)   Acute hypoxic respiratory failure - Continue supplemental oxygen, wean as tolerated - This is all likely secondary to pulmonary edema, there was question of CAP however procal is less than 1, he is afebrile without leukocytosis.  Will discontinue antibiotics. - CT also reveals nonocclusive thrombus compatible with chronic PE, unlikely contributing to hypoxia - Continue with IV diuretics for now -- Wean O2 as tolerated  Acute on chronic heart failure with preserved EF - Last echocardiogram 2019 EF 55 to 60%, grade 1 diastolic dysfunction - Echocardiogram ordered and still pending - BNP elevated at 639 on arrival, chest CT with pulmonary edema left greater than right.  Patient did initially receive 500 cc bolus on arrival due to concerns of sepsis, suspect this may have worsened the picture - Continue with IV Lasix twice daily, taper to p.o. as tolerated - Follow strict I's and O's - is not being recorded accurately. Have discussed with RN and pt.  Chronic pulmonary embolism - Incidentally seen on CT - Not likely contributing to current clinical picture - Have discussed risks and benefits of anticoagulation with the patient and his family and they have opted to monitor without anticoagulation for now.  They report he is high risk for falls and lives alone.  Have discussed urgent ER return precautions in the event of signs/symptoms new PE or DVT outpatient.  CKD stage IIIa - Baseline creatinine close to 1.3-1.7, 1.92 on arrival. Worsening  now with diuresis - Continue to monitor closely with diuresis and contrast exposure - I's and O's as above  History of myocardial infarction - Currently denies any chest pain - Prior MI over 20 years ago, no records available for review - Will follow-up on echo - Troponin on arrival 101->103.  Doubt acute ACS.  More likely troponin leak from CHF exacerbation as above - Continue with home dose aspirin, Plavix, statin  Hypertension - Resume home meds - Hold ARB for now given AKI  Diabetes, with hyperglycemia - Hemoglobin A1c 7.1% - Continue sliding scale insulin for now, titrate up as tolerated - Will hold home dose metformin while hospitalized    DVT prophylaxis: Lovenox   Code Status: Full Code Family Communication:  Discussed directly with patient, his daughter at bedside Disposition:  Inpatient still hospitalized for supplemental O2 and work up, will discharge to home when on room air, hopefully tomorrow  Consultants:    Procedures:    Antimicrobials:  Anti-infectives (From admission, onward)    Start     Dose/Rate Route Frequency Ordered Stop   07/19/23 1600  cefTRIAXone (ROCEPHIN) 2 g in sodium chloride 0.9 % 100 mL IVPB  Status:  Discontinued        2 g 200 mL/hr over 30 Minutes Intravenous Every 24 hours 07/18/23 2010 07/19/23 0807   07/19/23 1500  azithromycin (ZITHROMAX) 500 mg in sodium chloride 0.9 % 250 mL IVPB  Status:  Discontinued        500 mg 250 mL/hr over 60 Minutes Intravenous Every 24 hours 07/18/23 2010 07/19/23 0807   07/18/23 1530  cefTRIAXone (ROCEPHIN) 2 g in sodium chloride 0.9 % 100 mL IVPB        2 g 200 mL/hr over 30 Minutes Intravenous  Once 07/18/23 1521 07/18/23 1804   07/18/23 1530  azithromycin (ZITHROMAX) 500 mg in sodium chloride 0.9 % 250 mL IVPB        500 mg 250 mL/hr over 60 Minutes Intravenous  Once 07/18/23 1521 07/18/23 1737       Data Reviewed: I have personally reviewed following labs and imaging studies CBC: Recent  Labs  Lab 07/18/23 1147  WBC 8.9  NEUTROABS 7.3  HGB 10.2*  HCT 31.1*  MCV 93.7  PLT 128*   Basic Metabolic Panel: Recent Labs  Lab 07/18/23 1147 07/19/23 0434 07/20/23 0409  NA 132* 136 136  K 4.8 4.4 4.4  CL 105 109 106  CO2 18* 21* 21*  GLUCOSE 209* 102* 141*  BUN 36* 38* 45*  CREATININE 1.92* 1.93* 2.14*  CALCIUM 8.4* 8.1* 8.0*   GFR: Estimated Creatinine Clearance: 25.8 mL/min (A) (by C-G formula based on SCr of 2.14 mg/dL (H)). Liver Function Tests: Recent Labs  Lab 07/18/23 1147  AST 17  ALT 9  ALKPHOS 50  BILITOT 1.2  PROT 6.3*  ALBUMIN 3.1*   CBG: Recent Labs  Lab 07/19/23 1128 07/19/23 1610 07/19/23 2024 07/20/23 0725 07/20/23 1201  GLUCAP 169* 183* 183* 141* 181*  Recent Results (from the past 240 hours)  Resp panel by RT-PCR (RSV, Flu A&B, Covid) Anterior Nasal Swab     Status: None   Collection Time: 07/18/23 11:47 AM   Specimen: Anterior Nasal Swab  Result Value Ref Range Status   SARS Coronavirus 2 by RT PCR NEGATIVE NEGATIVE Final    Comment: (NOTE) SARS-CoV-2 target nucleic acids are NOT DETECTED.  The SARS-CoV-2 RNA is generally detectable in upper respiratory specimens during the acute phase of infection. The lowest concentration of SARS-CoV-2 viral copies this assay can detect is 138 copies/mL. A negative result does not preclude SARS-Cov-2 infection and should not be used as the sole basis for treatment or other patient management decisions. A negative result may occur with  improper specimen collection/handling, submission of specimen other than nasopharyngeal swab, presence of viral mutation(s) within the areas targeted by this assay, and inadequate number of viral copies(<138 copies/mL). A negative result must be combined with clinical observations, patient history, and epidemiological information. The expected result is Negative.  Fact Sheet for Patients:  BloggerCourse.com  Fact Sheet for  Healthcare Providers:  SeriousBroker.it  This test is no t yet approved or cleared by the [place] FDA and  has been authorized for detection and/or diagnosis of SARS-CoV-2 by FDA under an Emergency Use Authorization (EUA). This EUA will remain  in effect (meaning this test can be used) for the duration of the COVID-19 declaration under Section 564(b)(1) of the Act, 21 U.S.C.section 360bbb-3(b)(1), unless the authorization is terminated  or revoked sooner.       Influenza A by PCR NEGATIVE NEGATIVE Final   Influenza B by PCR NEGATIVE NEGATIVE Final    Comment: (NOTE) The Xpert Xpress SARS-CoV-2/FLU/RSV plus assay is intended as an aid in the diagnosis of influenza from Nasopharyngeal swab specimens and should not be used as a sole basis for treatment. Nasal washings and aspirates are unacceptable for Xpert Xpress SARS-CoV-2/FLU/RSV testing.  Fact Sheet for Patients: BloggerCourse.com  Fact Sheet for Healthcare Providers: SeriousBroker.it  This test is not yet approved or cleared by the [place] FDA and has been authorized for detection and/or diagnosis of SARS-CoV-2 by FDA under an Emergency Use Authorization (EUA). This EUA will remain in effect (meaning this test can be used) for the duration of the COVID-19 declaration under Section 564(b)(1) of the Act, 21 U.S.C. section 360bbb-3(b)(1), unless the authorization is terminated or revoked.     Resp Syncytial Virus by PCR NEGATIVE NEGATIVE Final    Comment: (NOTE) Fact Sheet for Patients: BloggerCourse.com  Fact Sheet for Healthcare Providers: SeriousBroker.it  This test is not yet approved or cleared by the [place] FDA and has been authorized for detection and/or diagnosis of SARS-CoV-2 by FDA under an Emergency Use Authorization (EUA). This EUA will remain in effect (meaning this  test can be used) for the duration of the COVID-19 declaration under Section 564(b)(1) of the Act, 21 U.S.C. section 360bbb-3(b)(1), unless the authorization is terminated or revoked.  Performed at [facility], [address] [place], [place] [address]      Radiology Studies: No results found.   Scheduled Meds:  amLODipine  10 mg Oral Daily   aspirin EC  81 mg Oral QHS   cloNIDine  0.1 mg Oral BID   enoxaparin (LOVENOX) injection  30 mg Subcutaneous Q24H   furosemide  40 mg Intravenous Q12H   gabapentin  300 mg Oral QHS   hydrALAZINE  100 mg Oral TID   insulin aspart  0-15 Units Subcutaneous TID WC   insulin aspart  0-5 Units Subcutaneous QHS   insulin glargine-yfgn  10 Units Subcutaneous QHS   rosuvastatin  10 mg Oral Daily   tamsulosin  0.4 mg Oral Daily   Continuous Infusions:     LOS: 2 days    Total time spent coordinating care:   Debarah Crape, DO [facility]  To contact the attending physician between 7A-7P please use Epic Chat. To contact the covering physician during after hours 7P-7A, please review Amion.   07/20/2023, 3:58 PM   *This document has been created with the assistance of dictation software. Please excuse typographical errors. *

## 2023-07-21 ENCOUNTER — Inpatient Hospital Stay (HOSPITAL_COMMUNITY): Payer: Medicare HMO

## 2023-07-21 DIAGNOSIS — J9601 Acute respiratory failure with hypoxia: Secondary | ICD-10-CM | POA: Diagnosis not present

## 2023-07-21 DIAGNOSIS — N1831 Chronic kidney disease, stage 3a: Secondary | ICD-10-CM | POA: Diagnosis not present

## 2023-07-21 DIAGNOSIS — I5020 Unspecified systolic (congestive) heart failure: Secondary | ICD-10-CM

## 2023-07-21 DIAGNOSIS — I5021 Acute systolic (congestive) heart failure: Secondary | ICD-10-CM

## 2023-07-21 LAB — CBC
HCT: 31.3 % — ABNORMAL LOW (ref 39.0–52.0)
Hemoglobin: 10.3 g/dL — ABNORMAL LOW (ref 13.0–17.0)
MCH: 30.1 pg (ref 26.0–34.0)
MCHC: 32.9 g/dL (ref 30.0–36.0)
MCV: 91.5 fL (ref 80.0–100.0)
Platelets: 195 10*3/uL (ref 150–400)
RBC: 3.42 MIL/uL — ABNORMAL LOW (ref 4.22–5.81)
RDW: 12.5 % (ref 11.5–15.5)
WBC: 6.1 10*3/uL (ref 4.0–10.5)
nRBC: 0 % (ref 0.0–0.2)

## 2023-07-21 LAB — BASIC METABOLIC PANEL
Anion gap: 9 (ref 5–15)
BUN: 53 mg/dL — ABNORMAL HIGH (ref 8–23)
CO2: 22 mmol/L (ref 22–32)
Calcium: 8.2 mg/dL — ABNORMAL LOW (ref 8.9–10.3)
Chloride: 105 mmol/L (ref 98–111)
Creatinine, Ser: 2.24 mg/dL — ABNORMAL HIGH (ref 0.61–1.24)
GFR, Estimated: 27 mL/min — ABNORMAL LOW (ref >60)
Glucose, Bld: 143 mg/dL — ABNORMAL HIGH (ref 70–99)
Potassium: 4.2 mmol/L (ref 3.5–5.1)
Sodium: 136 mmol/L (ref 135–145)

## 2023-07-21 LAB — GLUCOSE, CAPILLARY
Glucose-Capillary: 137 mg/dL — ABNORMAL HIGH (ref 70–99)
Glucose-Capillary: 160 mg/dL — ABNORMAL HIGH (ref 70–99)
Glucose-Capillary: 142 mg/dL — ABNORMAL HIGH (ref 70–99)
Glucose-Capillary: 177 mg/dL — ABNORMAL HIGH (ref 70–99)

## 2023-07-21 LAB — TSH: TSH: 2.091 u[IU]/mL (ref 0.350–4.500)

## 2023-07-21 MED ORDER — FUROSEMIDE 10 MG/ML IJ SOLN
40.0000 mg | Freq: Every day | INTRAMUSCULAR | Status: DC
  Administered 2023-07-22 – 2023-07-23 (×2): 40 mg via INTRAVENOUS
  Filled 2023-07-21 (×2): qty 4

## 2023-07-21 NOTE — Progress Notes (Signed)
   07/21/23 1200  ReDS Vest / Clip  Station Marker D  Ruler Value 37  ReDS Value Range < 36  ReDS Actual Value 34

## 2023-07-21 NOTE — Evaluation (Addendum)
 Physical Therapy Brief Evaluation and Discharge Note Patient Details Name: Jonathan Avery MRN: [id number] DOB: August 07, 1930 Today's Date: 07/21/2023   History of Present Illness  Jonathan Avery is a 89 y.o. male with medical history significant for diabetes mellitus, hypertension, myocardial infarction, CKD 3 , BPH.  Patient presented to the ED with complaints of difficulty breathing over the past 2 to 3 days, present at rest and with exertion.  He reports generalized weakness.  He has stable unchanged mild chronic cough.  No chest pain.  He also reports bilateral lower extremity swelling over the past 2 to 3 weeks, and swelling of his upper extremities.  He reports similar swelling in the past that improved with the medication his outpatient provider gave him, reports he still takes it  Clinical Impression  Patient is performing at functional baseline, no safety concerns noted throughout evaluation. During mobility, pt on room air and desaturating to 87-88% with recovering back to low 90s with rest breaks. Pt left on 2Ls Oxygen sitting at EOB due to remaining at 88% after 1-2 minute rest break after ambulation of 25-70ft. Pt only concern is fatigue which seems to be more related to medical diagnosis rather than functional debility.  Patient is safe to DC home with appropriate services, DME, and any other needs indicated.  PT to sign off at this time.  Please reconsult acute PT services if functional changes occur while admitted.  Thank you.       PT Assessment    Assistance Needed at Discharge       Equipment Recommendations None recommended by PT  Recommendations for Other Services       Precautions/Restrictions          Mobility  Bed Mobility          Transfers Overall transfer level: Independent Equipment used: None                    Ambulation/Gait   Gait Distance (Feet): 35 Feet Assistive device: None Gait Pattern/deviations: WFL(Within Functional Limits)    General Gait Details: On room air, monitoring SpO2, dropping to 87-88% and recovering with standing rest breaks. @ EOS pt's SpO2 at 88%. No LOB, just noted fatigue.  Home Activity Instructions    Stairs            Modified Rankin (Stroke Patients Only)        Balance   Sitting-balance support: No upper extremity supported Sitting balance-Leahy Scale: Normal       Standing balance-Leahy Scale: Good Standing balance comment: standing without AD.          Pertinent Vitals/Pain   Pain Assessment Pain Assessment: No/denies pain     Home Living   Living Arrangements: Alone       Home Equipment: Rolling Walker (2 wheels);Shower seat;Grab bars - tub/shower        Prior Function        UE/LE Scientist, research (medical) Communication: No apparent difficulties     Cognition         General Comments      Exercises     Assessment/Plan    PT Problem List         PT Visit Diagnosis Muscle weakness (generalized) (M62.81)    No Skilled PT Patient is independent with all acitivity/mobility   Co-evaluation  AMPAC 6 Clicks Help needed turning from your back to your side while in a flat bed without using bedrails?: None Help needed moving from lying on your back to sitting on the side of a flat bed without using bedrails?: None Help needed moving to and from a bed to a chair (including a wheelchair)?: None Help needed standing up from a chair using your arms (e.g., wheelchair or bedside chair)?: None Help needed to walk in hospital room?: None Help needed climbing 3-5 steps with a railing? : None 6 Click Score: 24      End of Session Equipment Utilized During Treatment: Gait belt Activity Tolerance: Patient tolerated treatment well Patient left: in bed;with family/visitor present Nurse Communication: Mobility status PT Visit Diagnosis: Muscle weakness (generalized) (M62.81)     Time:  1610-9604 PT Time Calculation (min) (ACUTE ONLY): 19 min  Charges:   PT Evaluation $PT Eval Low Complexity: 1 Low      Elie Goody, DPT [facility] [place] [address] [phone] office  Nelida Meuse  07/21/2023, 10:47 AM

## 2023-07-21 NOTE — Plan of Care (Signed)

## 2023-07-21 NOTE — Hospital Course (Signed)
 89 y.o. male with diabetes, hypertension, prior MI CKD stage III, BPH.  He presents with shortness of breath over the last 3 days, as well as bilateral lower extremity swelling over the last 3 weeks.  In the ED patient was found to be tachypneic, O2 sats 87% on 4 L creatinine 1.9.  Troponin 101 with repeat to 103.  D-dimer 1.13.  CTA chest shows nonocclusive thrombus within the distal aspect of the lobar branch of the right lower lobe compatible with a chronic PE, also reveals predominant groundglass opacities and mildly enlarged lymph nodes.  Patient received a 500 cc bolus.  He was initiated on ceftriaxone and azithromycin.  BNP 639, procalcitonin 0.11.  Patient was admitted to [facility] for acute hypoxic respiratory failure. He has been noted to have a new cardiomyopathy with EF now down to 25% with grade 2 DD.

## 2023-07-21 NOTE — Plan of Care (Signed)
   Problem: Activity: Goal: Risk for activity intolerance will decrease Outcome: Progressing   Problem: Coping: Goal: Level of anxiety will decrease Outcome: Progressing

## 2023-07-21 NOTE — Progress Notes (Signed)
 Nurse at bedside,patient alert and oriented times four. No c/o pain or discomfort noted. Bilateral edema noted to lower extremities greater to left leg than right .

## 2023-07-21 NOTE — Consult Note (Addendum)
 Cardiology Consultation   Patient ID: Jonathan Avery MRN: [id number]; DOB: 15-Feb-1931  Admit date: 07/18/2023 Date of Consult: 07/21/2023  PCP:  Carylon Perches, MD   [facility] Providers Cardiologist:  Dina Rich, MD        Patient Profile:   Jonathan Avery is a 89 y.o. male with a hx of reported CAD with MI/PTCA at [facility] over 20 years (records not in Epic), prior stroke vs TIA, CKD stage 3 (borderline a-b), BPH, bradycardia requiring cessation of metoprolol, carotid artery disease (1-39% RICA, 40-59% LICA) who is being seen 07/21/2023 for the evaluation of acute HFrEF at the request of Dr. Laural Benes.  History of Present Illness:   Mr. Helderman carries the above history, last evaluated 01/2023 by cardiology NP Ms. Philis Nettle and felt to be doing well. He continued to have dizziness without clear cardiac cause. Metoprolol was remotely stopped due to bradycardia and fatigue (HR 42 on 25mg  BID). He presented to [facility] with SOB and weakness for several days along with new LE edema. He was found to be hypoxic in the 80s requiring supplemental O2. Covid/flu/RSV neg. Labs show AKI with Cr 1.92->2.24 this admission (previously 1.3 in 12/2022, 1.38 by DXA in 03/2023, though previous AKI as well), pro-cal 0.11, BNP 639, hsTroponin 101-103, d-dimer elevated, worsening anemia/thrombocytopenia down to 10.2/plt 128, not yet rechecked. CXR NAD. CTA showed single tiny web-like nonocclusive thrombus within the distal aspect of the lobar branch artery of the right lower lobe, compatible with residua of a chronic pulmonary embolism. This also showed GGO L>R as well as LUL and lingula, question pulm edema vs atypical/viral infection, small bilateral effusions, multinodular thyroid gland, aortic/coronary atherosclerosis. He was briefly on abx/fluids and also treated with IV Lasix 40mg  BID. IM discussed anticoagulation with patient and he and his family opted for no anticoagulation. ?He has weaned down to 2L  O2 (up to 6L  earlier in stay). SBP variable 120s-160s. 2D echo shows EF 25% with severe diffuse hypokinesis worse in the anterior, inferior and septal walls; apical akinesis, mild LVH, G2DD, mildly reduced RV function, moderate LAE, mild MR (prior EF 2019 55-60%). He has diuresed -1.4L, weights inconsistent - admit 192, then 206, last 204.6lb. He reports improvement in his dyspnea but still has some LE edema. Just feels a little weak overall when ambulating. No recent angina.   Past Medical History:  Diagnosis Date   BPH (benign prostatic hypertrophy)    Cancer (HCC)    skin cancer - basal   Carotid artery occlusion    Diabetes (HCC)    Type II   High cholesterol    Hypertension    Myocardial infarction [facility]    Peripheral vascular disease (HCC)    Stroke (HCC)      documented 12/28/2019- ? TIA  " weeks 3 weeks ago, expresive aphasia lasted 2 -3 minutes."     Past Surgical History:  Procedure Laterality Date   AMPUTATION Right 09/12/2021   Procedure: AMPUTATION DIGIT;  Surgeon: Vickki Hearing, MD;  Location: [facility];  Service: Orthopedics;  Laterality: Right;   CARDIAC CATHETERIZATION     CHOLECYSTECTOMY  1973   COLONOSCOPY  07/20/2006   Dr. [name]:Status post right hemicolectomy. Residual colonic mucosa appeared normal, normal rectum.    COLONOSCOPY  2006/2007   sprawling villous adenoma at ileocecal valve   COLONOSCOPY  2011   Dr. Jena Gauss: normal rectum, pancolonic diverticulosis, 2 diminutive polyps, with path benign polypoid colonic mucosa   COLONOSCOPY N/A 01/02/2015  Procedure: COLONOSCOPY;  Surgeon: Corbin Ade, MD;  Location: [facility];  Service: Endoscopy;  Laterality: N/A;  1245   CORONARY ANGIOPLASTY     ENDARTERECTOMY Right 12/29/2019   Procedure: ENDARTERECTOMY CAROTID;  Surgeon: Larina Earthly, MD;  Location: [facility] OR;  Service: Vascular;  Laterality: Right;   ESOPHAGOGASTRODUODENOSCOPY (EGD) WITH PROPOFOL N/A 01/13/2018   Procedure: ESOPHAGOGASTRODUODENOSCOPY (EGD) WITH PROPOFOL;   Surgeon: Rachael Fee, MD;  Location: [facility];  Service: Endoscopy;  Laterality: N/A;   EUS N/A 01/13/2018   Procedure: UPPER ENDOSCOPIC ULTRASOUND (EUS) RADIAL;  Surgeon: Rachael Fee, MD;  Location: [facility];  Service: Endoscopy;  Laterality: N/A;   FINE NEEDLE ASPIRATION N/A 01/13/2018   Procedure: FINE NEEDLE ASPIRATION (FNA) LINEAR;  Surgeon: Rachael Fee, MD;  Location: [facility];  Service: Endoscopy;  Laterality: N/A;   open hemicolectomy  2007   due to villous adenoma   PATCH ANGIOPLASTY Right 12/29/2019   Procedure: RIGHT CAROTID PATCH ANGIOPLASTY USING HEMASHIELD PLATINUM FINESSE;  Surgeon: Larina Earthly, MD;  Location: [facility] OR;  Service: Vascular;  Laterality: Right;     Home Medications:  Prior to Admission medications   Medication Sig Start Date End Date Taking? Authorizing Provider  ALPHA LIPOIC ACID PO Take 1 capsule by mouth in the morning, at noon, and at bedtime.   Yes [provider]  amLODipine (NORVASC) 10 MG tablet TAKE ONE TABLET (10MG  TOTAL) BY MOUTH DAILY 04/19/23  Yes Branch, Dorothe Pea, MD  Apoaequorin (PREVAGEN PO) Take 1 tablet by mouth daily.   Yes [provider]  aspirin 81 MG tablet Take 81 mg by mouth at bedtime.    Yes [provider]  Cholecalciferol (VITAMIN D3) 10 MCG (400 UNIT) CAPS Take 800 mg by mouth daily.   Yes [provider]  cloNIDine (CATAPRES) 0.1 MG tablet TAKE ONE TABLET (0.1MG  TOTAL) BY MOUTH TWO TIMES DAILY 02/15/23  Yes Branch, Dorothe Pea, MD  gabapentin (NEURONTIN) 300 MG capsule Take 1 capsule by mouth at bedtime. 06/21/23  Yes [provider]  hydrALAZINE (APRESOLINE) 100 MG tablet Take 1 tablet (100 mg total) by mouth 3 (three) times daily. Dose change. 02/18/23  Yes Sharlene Dory, NP  hydrochlorothiazide (HYDRODIURIL) 12.5 MG tablet Take 12.5 mg by mouth every morning. 10/15/21  Yes [provider]  LANTUS SOLOSTAR 100 UNIT/ML Solostar Pen Inject 20 Units into the  skin daily. 11/27/14  Yes [provider]  magnesium gluconate (MAGONATE) 500 MG tablet Take 500 mg by mouth daily.   Yes [provider]  metFORMIN (GLUCOPHAGE) 500 MG tablet Take 500 mg by mouth in the morning and at bedtime.   Yes [provider]  olmesartan (BENICAR) 40 MG tablet Take 40 mg by mouth daily. 09/09/20  Yes [provider]  rosuvastatin (CRESTOR) 10 MG tablet Take 10 mg by mouth daily.   Yes [provider]  tamsulosin (FLOMAX) 0.4 MG CAPS capsule Take 0.4 mg by mouth daily. 11/05/14  Yes [provider]  vitamin B-12 (CYANOCOBALAMIN) 500 MCG tablet Take 500 mcg by mouth daily.   Yes [provider]    Inpatient Medications: Scheduled Meds:  amLODipine  10 mg Oral Daily   aspirin EC  81 mg Oral QHS   cloNIDine  0.1 mg Oral BID   enoxaparin (LOVENOX) injection  30 mg Subcutaneous Q24H   furosemide  40 mg Intravenous Q12H   gabapentin  300 mg Oral QHS   hydrALAZINE  100 mg  Oral TID   insulin aspart  0-15 Units Subcutaneous TID WC   insulin aspart  0-5 Units Subcutaneous QHS   insulin glargine-yfgn  10 Units Subcutaneous QHS   melatonin  6 mg Oral Once   rosuvastatin  10 mg Oral Daily   tamsulosin  0.4 mg Oral Daily    PRN Meds: acetaminophen **OR** acetaminophen, ondansetron **OR** ondansetron (ZOFRAN) IV, polyethylene glycol  Allergies:   No Known Allergies  Social History:   Social History   Tobacco Use   Smoking status: Former    Types: Cigars   Smokeless tobacco: Never  Substance Use Topics   Alcohol use: No    Alcohol/week: 0.0 standard drinks of alcohol     Family History:    Family History  Problem Relation Age of Onset   Ulcers Father    Diabetes type II Brother    Diabetes type II Brother    Colon cancer Neg Hx      ROS:  Please see the history of present illness.  All other ROS reviewed and negative.     Physical Exam/Data:   Vitals:   07/20/23 1933 07/20/23 2157 07/21/23  0454 07/21/23 0900  BP: 138/67 138/67  (!) 161/67  Pulse: 72   77  Resp: 16     Temp: 98.3 F (36.8 C)   98.1 F (36.7 C)  TempSrc: Oral   Oral  SpO2: 91%   94%  Weight:   92.8 kg   Height:        Intake/Output Summary (Last 24 hours) at 07/21/2023 1033 Last data filed at 07/21/2023 0453 Gross per 24 hour  Intake 520 ml  Output 1800 ml  Net -1280 ml      07/21/2023    4:54 AM 07/20/2023    5:00 AM 07/19/2023    4:26 AM  Last 3 Weights  Weight (lbs) 204 lb 9.4 oz 203 lb 14.8 oz 206 lb 5.6 oz  Weight (kg) 92.8 kg 92.5 kg 93.6 kg     Body mass index is 30.21 kg/m.  General: Well developed, well nourished, in no acute distress. Head: Normocephalic, atraumatic, sclera non-icteric, no xanthomas, nares are without discharge. Neck: Negative for carotid bruits. JVP not elevated. Lungs: Mildly diminished throughout but largely clear bilaterally to auscultation without wheezes, rales, or rhonchi. Breathing is unlabored. Heart: RRR S1 S2 without murmurs, rubs, or gallops.  Abdomen: Soft, non-tender, non-distended with normoactive bowel sounds. No rebound/guarding. Extremities: No clubbing or cyanosis. 1+ pale puffy soft BLE edema. Distal pedal pulses are 2+ and equal bilaterally. Neuro: Alert and oriented X 3. Moves all extremities spontaneously. Psych:  Responds to questions appropriately with a normal affect.   EKG:  The EKG was personally reviewed and demonstrates:  NSR 80bpom, NSVICD, nonspecific STTW changes, baseline wander. ST depression noted V5-V6. Telemetry:  Telemetry was personally reviewed and demonstrates:  NSR  Relevant CV Studies: 2d echo 07/20/23   1. Severe diffuse hypokinesis worse in the anterior, inferior and septal  walls; apical akinesis.. Left ventricular ejection fraction, by  estimation, is 25%. The left ventricle has severely decreased function.  The left ventricle demonstrates global  hypokinesis. The left ventricular internal cavity size was mildly  dilated.  There is mild left ventricular hypertrophy. Left ventricular diastolic  parameters are consistent with Grade II diastolic dysfunction  (pseudonormalization).   2. Right ventricular systolic function is mildly reduced. The right  ventricular size is normal.   3. Left atrial size was moderately dilated.  4. The mitral valve is normal in structure. Mild mitral valve  regurgitation.   5. The aortic valve is tricuspid. Aortic valve regurgitation is not  visualized.    Laboratory Data:  High Sensitivity Troponin:   Recent Labs  Lab 07/18/23 1147 07/18/23 1418  TROPONINIHS 101* 103*     Chemistry Recent Labs  Lab 07/19/23 0434 07/20/23 0409 07/21/23 0356  NA 136 136 136  K 4.4 4.4 4.2  CL 109 106 105  CO2 21* 21* 22  GLUCOSE 102* 141* 143*  BUN 38* 45* 53*  CREATININE 1.93* 2.14* 2.24*  CALCIUM 8.1* 8.0* 8.2*  GFRNONAA 32* 29* 27*  ANIONGAP 6 9 9     Recent Labs  Lab 07/18/23 1147  PROT 6.3*  ALBUMIN 3.1*  AST 17  ALT 9  ALKPHOS 50  BILITOT 1.2    Hematology Recent Labs  Lab 07/18/23 1147  WBC 8.9  RBC 3.32*  HGB 10.2*  HCT 31.1*  MCV 93.7  MCH 30.7  MCHC 32.8  RDW 12.9  PLT 128*    BNP Recent Labs  Lab 07/18/23 1517  BNP 639.0*    DDimer  Recent Labs  Lab 07/18/23 1147  DDIMER 1.13*     Radiology/Studies:  ECHOCARDIOGRAM COMPLETE Result Date: 07/20/2023    ECHOCARDIOGRAM REPORT   Patient Name:   LOURDES MANNING Date of Exam: 07/20/2023 Medical Rec #:  [id number]     Height:       69.0 in Accession #:    [id number]    Weight:       203.9 lb Date of Birth:  12/02/1930     BSA:          2.083 m Patient Age:    89 years      BP:           127/42 mmHg Patient Gender: M             HR:           73 bpm. [facility] Procedure: 2D Echo, Cardiac Doppler, Color Doppler and Intracardiac            Opacification Agent (Both Spectral and Color Flow Doppler were            utilized during procedure). Indications:    Swelling [[id number]]   History:        Patient has prior history of Echocardiogram examinations, most                 recent 04/07/2018. CHF; Risk Factors:Hypertension and Diabetes.  Sonographer:    Webb Laws Referring Phys: [id number] EJIROGHENE E EMOKPAE IMPRESSIONS  1. Severe diffuse hypokinesis worse in the anterior, inferior and septal walls; apical akinesis.. Left ventricular ejection fraction, by estimation, is 25%. The left ventricle has severely decreased function. The left ventricle demonstrates global hypokinesis. The left ventricular internal cavity size was mildly dilated. There is mild left ventricular hypertrophy. Left ventricular diastolic parameters are consistent with Grade II diastolic dysfunction (pseudonormalization).  2. Right ventricular systolic function is mildly reduced. The right ventricular size is normal.  3. Left atrial size was moderately dilated.  4. The mitral valve is normal in structure. Mild mitral valve regurgitation.  5. The aortic valve is tricuspid. Aortic valve regurgitation is not visualized. FINDINGS  Left Ventricle: Severe diffuse hypokinesis worse in the anterior, inferior and septal walls; apical akinesis. Left ventricular ejection fraction, by estimation, is 25%. The left ventricle has severely decreased function. The  left ventricle demonstrates global hypokinesis. Definity contrast agent was given IV to delineate the left ventricular endocardial borders. Strain imaging was not performed. The left ventricular internal cavity size was mildly dilated. There is mild left ventricular hypertrophy. Left ventricular diastolic parameters are consistent with Grade II diastolic dysfunction (pseudonormalization). Right Ventricle: The right ventricular size is normal. Right vetricular wall thickness was not assessed. Right ventricular systolic function is mildly reduced. Left Atrium: Left atrial size was moderately dilated. Right Atrium: Right atrial size was normal in size. Pericardium: There is no  evidence of pericardial effusion. Mitral Valve: The mitral valve is normal in structure. Mild mitral valve regurgitation. Tricuspid Valve: The tricuspid valve is normal in structure. Tricuspid valve regurgitation is trivial. Aortic Valve: The aortic valve is tricuspid. Aortic valve regurgitation is not visualized. Pulmonic Valve: The pulmonic valve was grossly normal. Pulmonic valve regurgitation is trivial. Aorta: The aortic root and ascending aorta are structurally normal, with no evidence of dilitation. IAS/Shunts: No atrial level shunt detected by color flow Doppler. Additional Comments: 3D imaging was not performed.  LEFT VENTRICLE PLAX 2D LVIDd:         5.50 cm      Diastology LVIDs:         3.80 cm      LV e' medial:    6.31 cm/s LV PW:         1.10 cm      LV E/e' medial:  16.2 LV IVS:        1.20 cm      LV e' lateral:   9.14 cm/s LVOT diam:     2.10 cm      LV E/e' lateral: 11.2 LV SV:         67 LV SV Index:   32 LVOT Area:     3.46 cm  LV Volumes (MOD) LV vol d, MOD A2C: 116.0 ml LV vol d, MOD A4C: 165.0 ml LV vol s, MOD A2C: 75.5 ml LV vol s, MOD A4C: 109.0 ml LV SV MOD A2C:     40.5 ml LV SV MOD A4C:     165.0 ml LV SV MOD BP:      49.7 ml RIGHT VENTRICLE RV Basal diam:  4.20 cm RV S prime:     11.40 cm/s TAPSE (M-mode): 1.8 cm LEFT ATRIUM             Index        RIGHT ATRIUM           Index LA diam:        4.50 cm 2.16 cm/m   RA Area:     15.00 cm LA Vol (A2C):   88.4 ml 42.44 ml/m  RA Volume:   40.50 ml  19.44 ml/m LA Vol (A4C):   88.3 ml 42.39 ml/m LA Biplane Vol: 94.2 ml 45.22 ml/m  AORTIC VALVE LVOT Vmax:   90.80 cm/s LVOT Vmean:  67.200 cm/s LVOT VTI:    0.194 m  AORTA Ao Root diam: 3.40 cm Ao Asc diam:  4.00 cm MITRAL VALVE MV Area (PHT): 3.39 cm     SHUNTS MV Decel Time: 224 msec     Systemic VTI:  0.19 m MV E velocity: 102.00 cm/s  Systemic Diam: 2.10 cm MV A velocity: 45.40 cm/s MV E/A ratio:  2.25 Dietrich Pates MD Electronically signed by Dietrich Pates MD Signature Date/Time:  07/20/2023/5:35:06 PM    Final    CT Angio Chest PE W and/or  Wo Contrast Result Date: 07/18/2023 CLINICAL DATA:  Pulmonary embolism (PE) suspected, low to intermediate prob, positive D-dimer EXAM: CT ANGIOGRAPHY CHEST WITH CONTRAST TECHNIQUE: Multidetector CT imaging of the chest was performed using the standard protocol during bolus administration of intravenous contrast. Multiplanar CT image reconstructions and MIPs were obtained to evaluate the vascular anatomy. RADIATION DOSE REDUCTION: This exam was performed according to the departmental dose-optimization program which includes automated exposure control, adjustment of the mA and/or kV according to patient size and/or use of iterative reconstruction technique. CONTRAST:  75mL OMNIPAQUE IOHEXOL 350 MG/ML SOLN COMPARISON:  02/07/2008 FINDINGS: Cardiovascular: Adequate opacification of the pulmonary arteries. Single tiny web-like nonocclusive thrombus within the distal aspect of the lobar branch artery of the right lower lobe (series 5, image 164). No additional pulmonary arterial filling defects. Pulmonary trunk measures 3.5 cm in diameter. Thoracic aorta is nonaneurysmal. Scattered atherosclerotic vascular calcifications of the aorta and coronary arteries. Heart size is upper limits of normal. No significant pericardial fluid. Mediastinum/Nodes: Multiple mildly enlarged mediastinal lymph nodes. Reference nodes include 12 mm AP window node (series 4, image 36), 10 mm precarinal node (series 4, image 38), and 14 mm subcarinal node, (series 4, image 46). Mildly enlarged right hilar lymph node measuring 12 mm (series 4, image 45). No enlarged axillary or left hilar lymph nodes. Multinodular thyroid gland with largest node on the right measuring up to 2.3 cm. Trachea and esophagus demonstrate no significant abnormality. Lungs/Pleura: Small layering bilateral pleural effusions. Predominantly ground-glass airspace opacity within the bilateral lower lobes, left worse  than right. Minimal ground-glass opacity within the left upper lobe and lingula. 6 mm subpleural nodule in the periphery of the right middle lobe (series 6, image 78), stable since 2009 and does not require follow-up imaging. No new pulmonary nodules. No pneumothorax. Upper Abdomen: No acute abnormality. Musculoskeletal: No acute osseous abnormality. Findings of diffuse idiopathic skeletal hyperostosis. Degenerative changes of both shoulders. No significant chest wall abnormality. Review of the MIP images confirms the above findings. IMPRESSION: 1. Single tiny web-like nonocclusive thrombus within the distal aspect of the lobar branch artery of the right lower lobe, compatible with residua of a chronic pulmonary embolism. No additional pulmonary arterial filling defects. 2. Predominantly ground-glass airspace opacity within the bilateral lower lobes, left worse than right. Minimal ground-glass opacity within the left upper lobe and lingula. This could represent pulmonary edema or atypical/viral infection. 3. Small layering bilateral pleural effusions. 4. Multiple mildly enlarged mediastinal and right hilar lymph nodes, likely reactive. 5. Multinodular thyroid gland with largest node on the right measuring up to 2.3 cm. Recommend nonemergent thyroid US (ref: J Am Coll Radiol. 2015 Feb;12(2): 143-50). 6. Aortic and coronary artery atherosclerosis (ICD10-I70.0). Electronically Signed   By: Duanne Guess D.O.   On: 07/18/2023 14:32   DG Chest Portable 1 View Result Date: 07/18/2023 CLINICAL DATA:  Weakness, short of breath EXAM: PORTABLE CHEST 1 VIEW COMPARISON:  None Available. FINDINGS: Normal mediastinum and cardiac silhouette. Normal pulmonary vasculature. No evidence of effusion, infiltrate, or pneumothorax. No acute bony abnormality. Nipple shadow projects over the LEFT costophrenic angle. IMPRESSION: No acute cardiopulmonary process. Electronically Signed   By: Genevive Bi M.D.   On: 07/18/2023 11:49      Assessment and Plan:   1. Generalized weakness, SOB, edema with acute hypoxic respiratory failure requiring [place] O2, found to have acute HFrEF - clinical case complicated by PE, AKI on CKD, and new anemia, so may be multifactorial presentation - etiology of CM unclear  but patient presently poor candidate for invasive ischemic workup given his renal dysfunction and anemia - would anticipate focus on symptom-based management - GDMT limited by CKD, prior bradycardia - will review recs with MD  2. Small PE by CTA this admission - per IM notes, there was shared decision making to hold off anticoagulation due to fall risk and lives alone - may benefit from LE venous duplex to quantify if there is more substantial clot burden - will defer to primary team to guide this decision in case it impacts anticoag recs  3. New anemia/thrombocytopenia by labs on 2/23 - ?etiology, has not been trended - recheck CBC today - further per primary team  4. AKI on CKD 3a - avoid nephrotoxic agents  5. HTN - manage in context above  6. CAD, elevated troponin - relatively low/flat, out of proportion to degree of cardiomyopathy - continue medical management - on ASA 81mg  daily, continue if CBC stable - rosuvastatin at max dose for CrCl   Risk Assessment/Risk Scores:        New York Heart Association (NYHA) Functional Class NYHA Class II-III   For questions or updates, please contact  [facility] Please consult [email or web address] for contact info under    Signed, Laurann Montana, PA-C  07/21/2023 10:33 AM   Attending note:  Patient seen and examined.  I reviewed his records and discussed the case with Ms. Jetta Lout, I agree with her above findings.  Mr. Baruch presents with increasing shortness of breath and weakness associated with lower extremity edema and acute hypoxic respiratory failure.  He has been diagnosed with a small pulmonary embolus unlikely to be unifying diagnosis, and also  newly documented HFrEF with LVEF approximately 25% and mild RV dysfunction.  High-sensitivity troponin I levels are not consistent with ACS, more likely demand ischemia.  Cardiology consulted to assist with management.  He is afebrile, heart rate in the 70s in sinus rhythm by telemetry which I personally reviewed.  Systolic blood pressure running 130s to 160s.  Lungs exhibit decreased breath sounds without wheezing or crackles.  Cardiac exam with RRR and no gallop.  He does have bilateral lower extremity edema which is mild.  Pertinent lab work includes potassium 4.2, BUN 53, creatinine 2.24, GFR 27, hemoglobin 10.3, platelets 193 5, TSH 2.091, BNP 639, high-sensitivity troponin I levels flat and in the low 100s.  Chest CTA reports what looks to be possibly a chronic small distal lobar pulmonary embolus involving the right lower lobe, groundglass airspace opacities suggesting either pulmonary edema or possible atypical viral infection, small pleural effusions and most likely reactive lymph nodes.  ECG shows sinus rhythm with IVCD and repolarization changes.  Newly documented HFrEF, duration of cardiomyopathy is not certain however.  Cardiac enzymes do not suggest ACS.  Chest CTA reports probable chronic, small distal lobar pulmonary embolus involving the right lower lobe which is also not likely to be a unifying diagnosis.  At this point he is not anticoagulated per primary team, could consider getting lower extremity venous Dopplers to ensure no large DVT burden that might change decision on anticoagulation.  Focus from a cardiac perspective will be modifying GDMT as able realizing there are already limitations (not able to tolerate beta-blocker previously due to symptomatic bradycardia, present renal insufficiency limits use of MRA/ARB/ARNI).  Do not anticipate an aggressive ischemic workup.  Currently on aspirin 81 mg daily, Norvasc 10 mg daily, clonidine 0.1 mg twice daily, Lasix 40 mg IV twice daily,  hydralazine 100 mg 3 times daily, and Crestor 10 mg daily.  Reduce Lasix to 40 mg IV once daily for now.  May consider adding Imdur next to hydralazine.  Jonelle Sidle, M.D., F.A.C.C.

## 2023-07-21 NOTE — Progress Notes (Addendum)
 PROGRESS NOTE   ALIOUNE Avery  ZOX:[id number] DOB: 1931/03/09 DOA: 07/18/2023 PCP: Carylon Perches, MD   Chief Complaint  Patient presents with   Shortness of Breath   Level of care: Telemetry  Brief Admission History:   89 y.o. male with diabetes, hypertension, prior MI CKD stage III, BPH.  He presents with shortness of breath over the last 3 days, as well as bilateral lower extremity swelling over the last 3 weeks.  In the ED patient was found to be tachypneic, O2 sats 87% on 4 L creatinine 1.9.  Troponin 101 with repeat to 103.  D-dimer 1.13.  CTA chest shows nonocclusive thrombus within the distal aspect of the lobar branch of the right lower lobe compatible with a chronic PE, also reveals predominant groundglass opacities and mildly enlarged lymph nodes.  Patient received a 500 cc bolus.  He was initiated on ceftriaxone and azithromycin.  BNP 639, procalcitonin 0.11.  Patient was admitted to [facility] for acute hypoxic respiratory failure. He has been noted to have a new cardiomyopathy with EF now down to 25% with grade 2 DD.     Assessment and Plan:  Acute hypoxic respiratory failure - Continue supplemental oxygen, wean as tolerated - This is all likely secondary to pulmonary edema, there was question of CAP however procal is less than 1, he is afebrile without leukocytosis.  Will discontinue antibiotics. - CT also reveals nonocclusive thrombus compatible with chronic PE, unlikely contributing to hypoxia - Continue with IV diuretics for now -- Wean O2 as tolerated   Acute HFrEF  - updated echocardiogram 2/25 with finding of LVEF 25% with grade 2 DD - cardiology team consulted and recommending med management with GDMT - Continue with IV Lasix daily 40 mg  - Follow strict I's and O's -- Reds Vest reading 34 which is reassuring  Intake/Output Summary (Last 24 hours) at 07/21/2023 1630 Last data filed at 07/21/2023 1228 Gross per 24 hour  Intake 520 ml  Output 2600 ml  Net -2080 ml   Filed  Weights   07/19/23 0426 07/20/23 0500 07/21/23 0454  Weight: 93.6 kg 92.5 kg 92.8 kg    Chronic pulmonary embolism - Incidentally seen on CT - doubt this is contributing to current clinical picture - Dr. Rennis Chris discussed risks and benefits of anticoagulation with the patient and his family and they have opted to monitor without anticoagulation for now.  They report he is high risk for falls and lives alone.  Have discussed urgent ER return precautions in the event of signs/symptoms new PE or DVT outpatient. -- venous US of bilateral lower extremities pending    AKI on CKD stage IIIa - Baseline creatinine close to 1.3-1.7, 1.92 on arrival.  His new baseline may now be 3b CKD.  - Continue to monitor closely with diuresis and contrast exposure - agree with reduction in lasix dose and rechecking labs in AM  -- renally dose medications   History of myocardial infarction / CAD - Currently denies any chest pain - Prior MI over 20 years ago, no records available for review - Will follow-up on echo - Troponin on arrival 101->103.  Doubt ACS.   - Continue with home dose aspirin, Plavix, statin   Hypertension - Resume home meds - Hold ARB for now given AKI   Diabetes type 2, with hyperglycemia, with vascular complications, uncontrolled - Hemoglobin A1c 7.1% - Continue sliding scale insulin for now, titrate up as tolerated - Will hold home dose metformin while hospitalized  DVT prophylaxis: enoxaparin  Code Status: Full  Family Communication:  Disposition: TBD  Consultants:  Cardiology  Procedures:   Antimicrobials:    Subjective: Pt reports he feels very frail and weak.  No chest pain.  He remains SOB.   Objective: Vitals:   07/20/23 1933 07/20/23 2157 07/21/23 0454 07/21/23 0900  BP: 138/67 138/67  (!) 161/67  Pulse: 72   77  Resp: 16     Temp: 98.3 F (36.8 C)   98.1 F (36.7 C)  TempSrc: Oral   Oral  SpO2: 91%   94%  Weight:   92.8 kg   Height:         Intake/Output Summary (Last 24 hours) at 07/21/2023 1628 Last data filed at 07/21/2023 1228 Gross per 24 hour  Intake 520 ml  Output 2600 ml  Net -2080 ml   Filed Weights   07/19/23 0426 07/20/23 0500 07/21/23 0454  Weight: 93.6 kg 92.5 kg 92.8 kg   Examination:  General exam: Appears calm and comfortable  Respiratory system: rare basilar crackles Respiratory effort normal. Cardiovascular system: normal S1 & S2 heard. No JVD, murmurs, rubs, gallops or clicks. No pedal edema. Gastrointestinal system: Abdomen is nondistended, soft and nontender. No organomegaly or masses felt. Normal bowel sounds heard. Central nervous system: Alert and oriented. No focal neurological deficits. Extremities: Symmetric 5 x 5 power. Skin: No rashes, lesions or ulcers. Psychiatry: Judgement and insight appear normal. Mood & affect appropriate.   Data Reviewed: I have personally reviewed following labs and imaging studies  CBC: Recent Labs  Lab 07/18/23 1147 07/21/23 1113  WBC 8.9 6.1  NEUTROABS 7.3  --   HGB 10.2* 10.3*  HCT 31.1* 31.3*  MCV 93.7 91.5  PLT 128* 195    Basic Metabolic Panel: Recent Labs  Lab 07/18/23 1147 07/19/23 0434 07/20/23 0409 07/21/23 0356  NA 132* 136 136 136  K 4.8 4.4 4.4 4.2  CL 105 109 106 105  CO2 18* 21* 21* 22  GLUCOSE 209* 102* 141* 143*  BUN 36* 38* 45* 53*  CREATININE 1.92* 1.93* 2.14* 2.24*  CALCIUM 8.4* 8.1* 8.0* 8.2*    CBG: Recent Labs  Lab 07/20/23 1650 07/20/23 2155 07/21/23 0735 07/21/23 1144 07/21/23 1605  GLUCAP 141* 170* 137* 160* 142*    Recent Results (from the past 240 hours)  Resp panel by RT-PCR (RSV, Flu A&B, Covid) Anterior Nasal Swab     Status: None   Collection Time: 07/18/23 11:47 AM   Specimen: Anterior Nasal Swab  Result Value Ref Range Status   SARS Coronavirus 2 by RT PCR NEGATIVE NEGATIVE Final    Comment: (NOTE) SARS-CoV-2 target nucleic acids are NOT DETECTED.  The SARS-CoV-2 RNA is generally  detectable in upper respiratory specimens during the acute phase of infection. The lowest concentration of SARS-CoV-2 viral copies this assay can detect is 138 copies/mL. A negative result does not preclude SARS-Cov-2 infection and should not be used as the sole basis for treatment or other patient management decisions. A negative result may occur with  improper specimen collection/handling, submission of specimen other than nasopharyngeal swab, presence of viral mutation(s) within the areas targeted by this assay, and inadequate number of viral copies(<138 copies/mL). A negative result must be combined with clinical observations, patient history, and epidemiological information. The expected result is Negative.  Fact Sheet for Patients:  BloggerCourse.com  Fact Sheet for Healthcare Providers:  SeriousBroker.it  This test is no t yet approved or cleared by the [place]  FDA and  has been authorized for detection and/or diagnosis of SARS-CoV-2 by FDA under an Emergency Use Authorization (EUA). This EUA will remain  in effect (meaning this test can be used) for the duration of the COVID-19 declaration under Section 564(b)(1) of the Act, 21 U.S.C.section 360bbb-3(b)(1), unless the authorization is terminated  or revoked sooner.       Influenza A by PCR NEGATIVE NEGATIVE Final   Influenza B by PCR NEGATIVE NEGATIVE Final    Comment: (NOTE) The Xpert Xpress SARS-CoV-2/FLU/RSV plus assay is intended as an aid in the diagnosis of influenza from Nasopharyngeal swab specimens and should not be used as a sole basis for treatment. Nasal washings and aspirates are unacceptable for Xpert Xpress SARS-CoV-2/FLU/RSV testing.  Fact Sheet for Patients: BloggerCourse.com  Fact Sheet for Healthcare Providers: SeriousBroker.it  This test is not yet approved or cleared by the [place] FDA  and has been authorized for detection and/or diagnosis of SARS-CoV-2 by FDA under an Emergency Use Authorization (EUA). This EUA will remain in effect (meaning this test can be used) for the duration of the COVID-19 declaration under Section 564(b)(1) of the Act, 21 U.S.C. section 360bbb-3(b)(1), unless the authorization is terminated or revoked.     Resp Syncytial Virus by PCR NEGATIVE NEGATIVE Final    Comment: (NOTE) Fact Sheet for Patients: BloggerCourse.com  Fact Sheet for Healthcare Providers: SeriousBroker.it  This test is not yet approved or cleared by the [place] FDA and has been authorized for detection and/or diagnosis of SARS-CoV-2 by FDA under an Emergency Use Authorization (EUA). This EUA will remain in effect (meaning this test can be used) for the duration of the COVID-19 declaration under Section 564(b)(1) of the Act, 21 U.S.C. section 360bbb-3(b)(1), unless the authorization is terminated or revoked.  Performed at [facility], [address], [place], [place] [address]      Radiology Studies: ECHOCARDIOGRAM COMPLETE Result Date: 07/20/2023    ECHOCARDIOGRAM REPORT   Patient Name:   Jonathan Avery Date of Exam: 07/20/2023 Medical Rec #:  [id number]     Height:       69.0 in Accession #:    [id number]    Weight:       203.9 lb Date of Birth:  1931/01/06     BSA:          2.083 m Patient Age:    89 years      BP:           127/42 mmHg Patient Gender: M             HR:           73 bpm. [facility] Procedure: 2D Echo, Cardiac Doppler, Color Doppler and Intracardiac            Opacification Agent (Both Spectral and Color Flow Doppler were            utilized during procedure). Indications:    Swelling [[id number]]  History:        Patient has prior history of Echocardiogram examinations, most                 recent 04/07/2018. CHF; Risk Factors:Hypertension and Diabetes.  Sonographer:    Webb Laws  Referring Phys: [id number] EJIROGHENE E EMOKPAE IMPRESSIONS  1. Severe diffuse hypokinesis worse in the anterior, inferior and septal walls; apical akinesis.. Left ventricular ejection fraction, by estimation, is 25%. The left ventricle has severely decreased function. The left ventricle  demonstrates global hypokinesis. The left ventricular internal cavity size was mildly dilated. There is mild left ventricular hypertrophy. Left ventricular diastolic parameters are consistent with Grade II diastolic dysfunction (pseudonormalization).  2. Right ventricular systolic function is mildly reduced. The right ventricular size is normal.  3. Left atrial size was moderately dilated.  4. The mitral valve is normal in structure. Mild mitral valve regurgitation.  5. The aortic valve is tricuspid. Aortic valve regurgitation is not visualized. FINDINGS  Left Ventricle: Severe diffuse hypokinesis worse in the anterior, inferior and septal walls; apical akinesis. Left ventricular ejection fraction, by estimation, is 25%. The left ventricle has severely decreased function. The left ventricle demonstrates global hypokinesis. Definity contrast agent was given IV to delineate the left ventricular endocardial borders. Strain imaging was not performed. The left ventricular internal cavity size was mildly dilated. There is mild left ventricular hypertrophy. Left ventricular diastolic parameters are consistent with Grade II diastolic dysfunction (pseudonormalization). Right Ventricle: The right ventricular size is normal. Right vetricular wall thickness was not assessed. Right ventricular systolic function is mildly reduced. Left Atrium: Left atrial size was moderately dilated. Right Atrium: Right atrial size was normal in size. Pericardium: There is no evidence of pericardial effusion. Mitral Valve: The mitral valve is normal in structure. Mild mitral valve regurgitation. Tricuspid Valve: The tricuspid valve is normal in structure. Tricuspid  valve regurgitation is trivial. Aortic Valve: The aortic valve is tricuspid. Aortic valve regurgitation is not visualized. Pulmonic Valve: The pulmonic valve was grossly normal. Pulmonic valve regurgitation is trivial. Aorta: The aortic root and ascending aorta are structurally normal, with no evidence of dilitation. IAS/Shunts: No atrial level shunt detected by color flow Doppler. Additional Comments: 3D imaging was not performed.  LEFT VENTRICLE PLAX 2D LVIDd:         5.50 cm      Diastology LVIDs:         3.80 cm      LV e' medial:    6.31 cm/s LV PW:         1.10 cm      LV E/e' medial:  16.2 LV IVS:        1.20 cm      LV e' lateral:   9.14 cm/s LVOT diam:     2.10 cm      LV E/e' lateral: 11.2 LV SV:         67 LV SV Index:   32 LVOT Area:     3.46 cm  LV Volumes (MOD) LV vol d, MOD A2C: 116.0 ml LV vol d, MOD A4C: 165.0 ml LV vol s, MOD A2C: 75.5 ml LV vol s, MOD A4C: 109.0 ml LV SV MOD A2C:     40.5 ml LV SV MOD A4C:     165.0 ml LV SV MOD BP:      49.7 ml RIGHT VENTRICLE RV Basal diam:  4.20 cm RV S prime:     11.40 cm/s TAPSE (M-mode): 1.8 cm LEFT ATRIUM             Index        RIGHT ATRIUM           Index LA diam:        4.50 cm 2.16 cm/m   RA Area:     15.00 cm LA Vol (A2C):   88.4 ml 42.44 ml/m  RA Volume:   40.50 ml  19.44 ml/m LA Vol (A4C):   88.3 ml 42.39 ml/m LA Biplane  Vol: 94.2 ml 45.22 ml/m  AORTIC VALVE LVOT Vmax:   90.80 cm/s LVOT Vmean:  67.200 cm/s LVOT VTI:    0.194 m  AORTA Ao Root diam: 3.40 cm Ao Asc diam:  4.00 cm MITRAL VALVE MV Area (PHT): 3.39 cm     SHUNTS MV Decel Time: 224 msec     Systemic VTI:  0.19 m MV E velocity: 102.00 cm/s  Systemic Diam: 2.10 cm MV A velocity: 45.40 cm/s MV E/A ratio:  2.25 Dietrich Pates MD Electronically signed by Dietrich Pates MD Signature Date/Time: 07/20/2023/5:35:06 PM    Final     Scheduled Meds:  amLODipine  10 mg Oral Daily   aspirin EC  81 mg Oral QHS   cloNIDine  0.1 mg Oral BID   enoxaparin (LOVENOX) injection  30 mg Subcutaneous Q24H    [START ON 07/22/2023] furosemide  40 mg Intravenous Daily   gabapentin  300 mg Oral QHS   hydrALAZINE  100 mg Oral TID   insulin aspart  0-15 Units Subcutaneous TID WC   insulin aspart  0-5 Units Subcutaneous QHS   insulin glargine-yfgn  10 Units Subcutaneous QHS   melatonin  6 mg Oral Once   rosuvastatin  10 mg Oral Daily   tamsulosin  0.4 mg Oral Daily   Continuous Infusions:   LOS: 3 days   Time spent: 57 mins  Fedra Lanter Laural Benes, MD How to contact the [facility] Attending or Consulting provider 7A - 7P or covering provider during after hours 7P -7A, for this patient?  Check the care team in [facility] and look for a) attending/consulting [facility] provider listed and b) the [facility] team listed Log into [email or web address] to find provider on call.  Locate the [facility] provider you are looking for under [facility] and page to a number that you can be directly reached. If you still have difficulty reaching the provider, please page the [facility] (Director on Call) for the Hospitalists listed on amion for assistance.  07/21/2023, 4:28 PM

## 2023-07-21 NOTE — Evaluation (Signed)
 Occupational Therapy Evaluation Patient Details Name: Jonathan Avery MRN: [id number] DOB: 18-Jan-1931 Today's Date: 07/21/2023   History of Present Illness   Jonathan Avery is a 89 y.o. male with medical history significant for diabetes mellitus, hypertension, myocardial infarction, CKD 3 , BPH.  Patient presented to the ED with complaints of difficulty breathing over the past 2 to 3 days, present at rest and with exertion.  He reports generalized weakness.  He has stable unchanged mild chronic cough.  No chest pain.  He also reports bilateral lower extremity swelling over the past 2 to 3 weeks, and swelling of his upper extremities.  He reports similar swelling in the past that improved with the medication his outpatient provider gave him, reports he still takes it     Clinical Impressions Pt agreeable to OT evaluation. Pt reporting he feels good but fatigues easily. Pt demonstrating independence in ADLs and functional mobility SpO2 dropping to 88-89 with mobility but immediately recovering to 90-94 with rest break. Educated on energy conservation and pacing himself. No further OT services required at this time.      Functional Status Assessment   Patient has had a recent decline in their functional status and demonstrates the ability to make significant improvements in function in a reasonable and predictable amount of time.     Equipment Recommendations   None recommended by OT      Precautions/Restrictions   Precautions Precautions: None Restrictions Weight Bearing Restrictions Per Provider Order: No     Mobility Bed Mobility Overal bed mobility: Independent                  Transfers Overall transfer level: Independent Equipment used: None                          ADL either performed or assessed with clinical judgement   ADL Overall ADL's : Modified independent                                             Vision Baseline  Vision/History: 1 Wears glasses Ability to See in Adequate Light: 1 Impaired Patient Visual Report: No change from baseline Vision Assessment?: No apparent visual deficits            Pertinent Vitals/Pain Pain Assessment Pain Assessment: No/denies pain     Extremity/Trunk Assessment Upper Extremity Assessment Upper Extremity Assessment: Overall WFL for tasks assessed   Lower Extremity Assessment Lower Extremity Assessment: Defer to PT evaluation   Cervical / Trunk Assessment Cervical / Trunk Assessment: Normal   Communication Communication Communication: No apparent difficulties   Cognition Arousal: Alert Behavior During Therapy: WFL for tasks assessed/performed Cognition: No apparent impairments             OT - Cognition Comments: No concerns                 Following commands: Intact                  Home Living Family/patient expects to be discharged to:: Private residence Living Arrangements: Alone Available Help at Discharge: Family;Available 24 hours/day Type of Home: House Home Access: Stairs to enter [employer] of Steps: 1 Entrance Stairs-Rails: None Home Layout: Two level;Laundry or work area in Artist of Steps: 11 Alternate Level Stairs-Rails: Right Bathroom Shower/Tub:  Walk-in shower   Bathroom Toilet: Handicapped height     Home Equipment: Agricultural consultant (2 wheels);Shower seat;Grab bars - tub/shower          Prior Functioning/Environment Prior Level of Function : Independent/Modified Independent;Driving             Mobility Comments: Independent in mobility ADLs Comments: Independent in ADLs    OT Problem List: Decreased activity tolerance    AM-PAC OT "6 Clicks" Daily Activity     Outcome Measure Help from another person eating meals?: None Help from another person taking care of personal grooming?: None Help from another person toileting, which includes using toliet,  bedpan, or urinal?: None Help from another person bathing (including washing, rinsing, drying)?: None Help from another person to put on and taking off regular upper body clothing?: None   6 Click Score: 20   End of Session Equipment Utilized During Treatment: Oxygen  Activity Tolerance: Patient tolerated treatment well Patient left:    OT Visit Diagnosis: Muscle weakness (generalized) (M62.81)                Time: 1610-9604 OT Time Calculation (min): 20 min Charges:  OT General Charges $OT Visit: 1 Visit OT Evaluation $OT Eval Low Complexity: 1 Low   Ezra Sites, OTR/L  [phone] 07/21/2023, 9:01 AM

## 2023-07-22 DIAGNOSIS — I5021 Acute systolic (congestive) heart failure: Secondary | ICD-10-CM | POA: Diagnosis not present

## 2023-07-22 DIAGNOSIS — N1831 Chronic kidney disease, stage 3a: Secondary | ICD-10-CM | POA: Diagnosis not present

## 2023-07-22 DIAGNOSIS — J9601 Acute respiratory failure with hypoxia: Secondary | ICD-10-CM | POA: Diagnosis not present

## 2023-07-22 LAB — GLUCOSE, CAPILLARY
Glucose-Capillary: 132 mg/dL — ABNORMAL HIGH (ref 70–99)
Glucose-Capillary: 202 mg/dL — ABNORMAL HIGH (ref 70–99)
Glucose-Capillary: 149 mg/dL — ABNORMAL HIGH (ref 70–99)
Glucose-Capillary: 158 mg/dL — ABNORMAL HIGH (ref 70–99)

## 2023-07-22 LAB — CBC
HCT: 29.4 % — ABNORMAL LOW (ref 39.0–52.0)
Hemoglobin: 9.5 g/dL — ABNORMAL LOW (ref 13.0–17.0)
MCH: 30.1 pg (ref 26.0–34.0)
MCHC: 32.3 g/dL (ref 30.0–36.0)
MCV: 93 fL (ref 80.0–100.0)
Platelets: 198 10*3/uL (ref 150–400)
RBC: 3.16 MIL/uL — ABNORMAL LOW (ref 4.22–5.81)
RDW: 12.4 % (ref 11.5–15.5)
WBC: 5.9 10*3/uL (ref 4.0–10.5)
nRBC: 0 % (ref 0.0–0.2)

## 2023-07-22 LAB — RENAL FUNCTION PANEL
Albumin: 2.6 g/dL — ABNORMAL LOW (ref 3.5–5.0)
Anion gap: 7 (ref 5–15)
BUN: 54 mg/dL — ABNORMAL HIGH (ref 8–23)
CO2: 23 mmol/L (ref 22–32)
Calcium: 8.1 mg/dL — ABNORMAL LOW (ref 8.9–10.3)
Chloride: 107 mmol/L (ref 98–111)
Creatinine, Ser: 2.03 mg/dL — ABNORMAL HIGH (ref 0.61–1.24)
GFR, Estimated: 31 mL/min — ABNORMAL LOW (ref >60)
Glucose, Bld: 106 mg/dL — ABNORMAL HIGH (ref 70–99)
Phosphorus: 4.5 mg/dL (ref 2.5–4.6)
Potassium: 4.2 mmol/L (ref 3.5–5.1)
Sodium: 137 mmol/L (ref 135–145)

## 2023-07-22 LAB — MAGNESIUM: Magnesium: 1.9 mg/dL (ref 1.7–2.4)

## 2023-07-22 NOTE — Progress Notes (Signed)
 PROGRESS NOTE   Jonathan Avery  ZOX:[id number] DOB: 1931/02/06 DOA: 07/18/2023 PCP: Carylon Perches, MD   Chief Complaint  Patient presents with   Shortness of Breath   Level of care: Telemetry  Brief Admission History:   89 y.o. male with diabetes, hypertension, prior MI CKD stage III, BPH.  He presents with shortness of breath over the last 3 days, as well as bilateral lower extremity swelling over the last 3 weeks.  In the ED patient was found to be tachypneic, O2 sats 87% on 4 L creatinine 1.9.  Troponin 101 with repeat to 103.  D-dimer 1.13.  CTA chest shows nonocclusive thrombus within the distal aspect of the lobar branch of the right lower lobe compatible with a chronic PE, also reveals predominant groundglass opacities and mildly enlarged lymph nodes.  Patient received a 500 cc bolus.  He was initiated on ceftriaxone and azithromycin.  BNP 639, procalcitonin 0.11.  Patient was admitted to [facility] for acute hypoxic respiratory failure. He has been noted to have a new cardiomyopathy with EF now down to 25% with grade 2 DD.     Assessment and Plan:  Acute hypoxic respiratory failure - Continue supplemental oxygen, wean as tolerated - This is all likely secondary to pulmonary edema, there was question of CAP however procal is less than 1, he is afebrile without leukocytosis.  Will discontinue antibiotics. - CT also reveals nonocclusive thrombus compatible with chronic PE, unlikely contributing to hypoxia - Continue with IV diuretics for now -- Wean O2 as tolerated -- he could only be weaned to 1L/min oxygen likely because of heart failure and pulmonary embolism; anticipating he will discharge home on supplemental oxygen and could be weaned in the outpatient setting if able    Acute HFrEF  - updated echocardiogram 2/25 with finding of LVEF 25% with grade 2 DD - cardiology team consulted and recommending med management with GDMT - Continue with IV Lasix daily 40 mg  - Follow strict I's and  O's -- Reds Vest reading 32 which is reassuring -- planning DC home on oral lasix per cardiology team recommendations  Intake/Output Summary (Last 24 hours) at 07/22/2023 1213 Last data filed at 07/22/2023 0700 Gross per 24 hour  Intake 240 ml  Output 2600 ml  Net -2360 ml   Filed Weights   07/20/23 0500 07/21/23 0454 07/22/23 0500  Weight: 92.5 kg 92.8 kg 90.4 kg    Chronic pulmonary embolism - Incidentally seen on CT - doubt this is contributing to current clinical picture - Dr. Rennis Chris discussed risks and benefits of anticoagulation with the patient and his family and they have opted to monitor without anticoagulation for now.  They report he is high risk for falls and lives alone.  Have discussed urgent ER return precautions in the event of signs/symptoms new PE or DVT outpatient. -- venous US of bilateral lower extremities with findings of subacute to chronic DVT in the left popliteal area;  -- discussed results with family again, they are having another family discussion today with patient at center and they are asking for more time to make decision and weigh risks of full anticoagulation, they plan to let me know tomorrow if they want to try full anticoagulation    AKI on CKD stage IIIa - Baseline creatinine close to 1.3-1.7, 1.92 on arrival.  His new baseline may now be 3b CKD.  - Continue to monitor closely with diuresis and contrast exposure - agree with reduction in lasix dose and  rechecking labs in AM  -- renally dose medications   History of myocardial infarction / CAD - Currently denies any chest pain - Prior MI over 20 years ago, no records available for review - Will follow-up on echo - Troponin on arrival 101->103.  Doubt ACS.   - Continue with home dose aspirin, Plavix, statin   Hypertension - Resume home meds - Hold ARB for now given AKI   Diabetes type 2, with hyperglycemia, with vascular complications, uncontrolled - Hemoglobin A1c 7.1% - Continue sliding  scale insulin for now, titrate up as tolerated - Will hold home dose metformin while hospitalized  CBG (last 3)  Recent Labs    07/21/23 2018 07/22/23 0741 07/22/23 1107  GLUCAP 177* 132* 202*   DVT prophylaxis: enoxaparin  Code Status: Full  Family Communication: discussion with daughter 2/27  Disposition: TBD  Consultants:  Cardiology   Procedures:   Antimicrobials:    Subjective: Pt is feeling much better, he is afraid of going home with oxygen. Denies palpitations and denies CP.    Objective: Vitals:   07/22/23 0527 07/22/23 0956 07/22/23 1020 07/22/23 1038  BP: 136/62 (!) 153/66    Pulse: 68 74    Resp: 20     Temp: 97.8 F (36.6 C)     TempSrc: Oral     SpO2: 91% 93% (!) 88% 93%  Weight:      Height:        Intake/Output Summary (Last 24 hours) at 07/22/2023 1213 Last data filed at 07/22/2023 0700 Gross per 24 hour  Intake 240 ml  Output 2600 ml  Net -2360 ml   Filed Weights   07/20/23 0500 07/21/23 0454 07/22/23 0500  Weight: 92.5 kg 92.8 kg 90.4 kg   Examination:  General exam: Appears calm and comfortable  Respiratory system: no increased work of breathing, no crackles heard today. Cardiovascular system: normal S1 & S2 heard. No JVD, murmurs, rubs, gallops or clicks. No pedal edema. Gastrointestinal system: Abdomen is nondistended, soft and nontender. No organomegaly or masses felt. Normal bowel sounds heard. Central nervous system: Alert and oriented. No focal neurological deficits. Extremities: Symmetric 5 x 5 power. Skin: No rashes, lesions or ulcers. Psychiatry: Judgement and insight appear normal. Mood & affect appropriate.   Data Reviewed: I have personally reviewed following labs and imaging studies  CBC: Recent Labs  Lab 07/18/23 1147 07/21/23 1113 07/22/23 0408  WBC 8.9 6.1 5.9  NEUTROABS 7.3  --   --   HGB 10.2* 10.3* 9.5*  HCT 31.1* 31.3* 29.4*  MCV 93.7 91.5 93.0  PLT 128* 195 198    Basic Metabolic Panel: Recent Labs   Lab 07/18/23 1147 07/19/23 0434 07/20/23 0409 07/21/23 0356 07/22/23 0408  NA 132* 136 136 136 137  K 4.8 4.4 4.4 4.2 4.2  CL 105 109 106 105 107  CO2 18* 21* 21* 22 23  GLUCOSE 209* 102* 141* 143* 106*  BUN 36* 38* 45* 53* 54*  CREATININE 1.92* 1.93* 2.14* 2.24* 2.03*  CALCIUM 8.4* 8.1* 8.0* 8.2* 8.1*  MG  --   --   --   --  1.9  PHOS  --   --   --   --  4.5    CBG: Recent Labs  Lab 07/21/23 1144 07/21/23 1605 07/21/23 2018 07/22/23 0741 07/22/23 1107  GLUCAP 160* 142* 177* 132* 202*    Recent Results (from the past 240 hours)  Resp panel by RT-PCR (RSV, Flu A&B, Covid) Anterior Nasal  Swab     Status: None   Collection Time: 07/18/23 11:47 AM   Specimen: Anterior Nasal Swab  Result Value Ref Range Status   SARS Coronavirus 2 by RT PCR NEGATIVE NEGATIVE Final    Comment: (NOTE) SARS-CoV-2 target nucleic acids are NOT DETECTED.  The SARS-CoV-2 RNA is generally detectable in upper respiratory specimens during the acute phase of infection. The lowest concentration of SARS-CoV-2 viral copies this assay can detect is 138 copies/mL. A negative result does not preclude SARS-Cov-2 infection and should not be used as the sole basis for treatment or other patient management decisions. A negative result may occur with  improper specimen collection/handling, submission of specimen other than nasopharyngeal swab, presence of viral mutation(s) within the areas targeted by this assay, and inadequate number of viral copies(<138 copies/mL). A negative result must be combined with clinical observations, patient history, and epidemiological information. The expected result is Negative.  Fact Sheet for Patients:  BloggerCourse.com  Fact Sheet for Healthcare Providers:  SeriousBroker.it  This test is no t yet approved or cleared by the [place] FDA and  has been authorized for detection and/or diagnosis of SARS-CoV-2 by FDA  under an Emergency Use Authorization (EUA). This EUA will remain  in effect (meaning this test can be used) for the duration of the COVID-19 declaration under Section 564(b)(1) of the Act, 21 U.S.C.section 360bbb-3(b)(1), unless the authorization is terminated  or revoked sooner.       Influenza A by PCR NEGATIVE NEGATIVE Final   Influenza B by PCR NEGATIVE NEGATIVE Final    Comment: (NOTE) The Xpert Xpress SARS-CoV-2/FLU/RSV plus assay is intended as an aid in the diagnosis of influenza from Nasopharyngeal swab specimens and should not be used as a sole basis for treatment. Nasal washings and aspirates are unacceptable for Xpert Xpress SARS-CoV-2/FLU/RSV testing.  Fact Sheet for Patients: BloggerCourse.com  Fact Sheet for Healthcare Providers: SeriousBroker.it  This test is not yet approved or cleared by the [place] FDA and has been authorized for detection and/or diagnosis of SARS-CoV-2 by FDA under an Emergency Use Authorization (EUA). This EUA will remain in effect (meaning this test can be used) for the duration of the COVID-19 declaration under Section 564(b)(1) of the Act, 21 U.S.C. section 360bbb-3(b)(1), unless the authorization is terminated or revoked.     Resp Syncytial Virus by PCR NEGATIVE NEGATIVE Final    Comment: (NOTE) Fact Sheet for Patients: BloggerCourse.com  Fact Sheet for Healthcare Providers: SeriousBroker.it  This test is not yet approved or cleared by the [place] FDA and has been authorized for detection and/or diagnosis of SARS-CoV-2 by FDA under an Emergency Use Authorization (EUA). This EUA will remain in effect (meaning this test can be used) for the duration of the COVID-19 declaration under Section 564(b)(1) of the Act, 21 U.S.C. section 360bbb-3(b)(1), unless the authorization is terminated or revoked.  Performed at [facility], [address], [place], [place] [address]      Radiology Studies: US Venous Img Lower Bilateral (DVT) Result Date: 07/21/2023 CLINICAL DATA:  [id number] Pulmonary embolism (HCC) [id number] EXAM: BILATERAL LOWER EXTREMITY VENOUS DOPPLER ULTRASOUND TECHNIQUE: Gray-scale sonography with graded compression, as well as color Doppler and duplex ultrasound were performed to evaluate the lower extremity deep venous systems from the level of the common femoral vein and including the common femoral, femoral, profunda femoral, popliteal and calf veins including the posterior tibial, peroneal and gastrocnemius veins when visible. The superficial great saphenous vein was also interrogated. Spectral  Doppler was utilized to evaluate flow at rest and with distal augmentation maneuvers in the common femoral, femoral and popliteal veins. COMPARISON:  CTA chest, 07/18/2023.  MR, 05/01/2021 FINDINGS: RIGHT LOWER EXTREMITY VENOUS Normal compressibility of the RIGHT common femoral, superficial femoral, and popliteal veins, as well as the visualized calf veins. Visualized portions of profunda femoral vein and great saphenous vein unremarkable. No filling defects to suggest DVT on grayscale or color Doppler imaging. Doppler waveforms show normal direction of venous flow, normal respiratory plasticity and response to augmentation. OTHER No evidence of superficial thrombophlebitis. At the RIGHT upper thigh is a multi-septate, anechoic and avascular fluid collection measuring approximately 7.1 x 2 1 x 6.6 cm. Limitations: none LEFT LOWER EXTREMITY VENOUS Normal compressibility of the LEFT common femoral, superficial femoral, as well as the visualized portions of the profunda femoral and greater saphenous vein. The calf veins are difficult to evaluate. Heterogeneously-echogenic partially-occlusive filling defect with noncompressibility of the imaged portions of the LEFT popliteal vein. OTHER No evidence of superficial thrombophlebitis or  abnormal fluid collection. Limitations: none IMPRESSION: 1. Subacute-to-chronic appearing, partially-occlusive DVT within the LEFT popliteal vein. 2. No evidence of femoropopliteal DVT or superficial thrombophlebitis within the RIGHT lower extremity. 3. 7 cm RIGHT upper thigh septated subcutaneous fluid collection, incompletely assessed. If continued clinical concern, consider musculoskeletal MR for further evaluation Roanna Banning, MD [facility] Electronically Signed   By: Roanna Banning M.D.   On: 07/21/2023 17:25   ECHOCARDIOGRAM COMPLETE Result Date: 07/20/2023    ECHOCARDIOGRAM REPORT   Patient Name:   QUENTYN KOLBECK Date of Exam: 07/20/2023 Medical Rec #:  [id number]     Height:       69.0 in Accession #:    [id number]    Weight:       203.9 lb Date of Birth:  1930/08/17     BSA:          2.083 m Patient Age:    89 years      BP:           127/42 mmHg Patient Gender: M             HR:           73 bpm. [facility] Procedure: 2D Echo, Cardiac Doppler, Color Doppler and Intracardiac            Opacification Agent (Both Spectral and Color Flow Doppler were            utilized during procedure). Indications:    Swelling [[id number]]  History:        Patient has prior history of Echocardiogram examinations, most                 recent 04/07/2018. CHF; Risk Factors:Hypertension and Diabetes.  Sonographer:    Webb Laws Referring Phys: [id number] EJIROGHENE E EMOKPAE IMPRESSIONS  1. Severe diffuse hypokinesis worse in the anterior, inferior and septal walls; apical akinesis.. Left ventricular ejection fraction, by estimation, is 25%. The left ventricle has severely decreased function. The left ventricle demonstrates global hypokinesis. The left ventricular internal cavity size was mildly dilated. There is mild left ventricular hypertrophy. Left ventricular diastolic parameters are consistent with Grade II diastolic dysfunction (pseudonormalization).   2. Right ventricular systolic function is mildly reduced. The right ventricular size is normal.  3. Left atrial size was moderately dilated.  4. The mitral valve is normal in structure. Mild mitral valve regurgitation.  5. The  aortic valve is tricuspid. Aortic valve regurgitation is not visualized. FINDINGS  Left Ventricle: Severe diffuse hypokinesis worse in the anterior, inferior and septal walls; apical akinesis. Left ventricular ejection fraction, by estimation, is 25%. The left ventricle has severely decreased function. The left ventricle demonstrates global hypokinesis. Definity contrast agent was given IV to delineate the left ventricular endocardial borders. Strain imaging was not performed. The left ventricular internal cavity size was mildly dilated. There is mild left ventricular hypertrophy. Left ventricular diastolic parameters are consistent with Grade II diastolic dysfunction (pseudonormalization). Right Ventricle: The right ventricular size is normal. Right vetricular wall thickness was not assessed. Right ventricular systolic function is mildly reduced. Left Atrium: Left atrial size was moderately dilated. Right Atrium: Right atrial size was normal in size. Pericardium: There is no evidence of pericardial effusion. Mitral Valve: The mitral valve is normal in structure. Mild mitral valve regurgitation. Tricuspid Valve: The tricuspid valve is normal in structure. Tricuspid valve regurgitation is trivial. Aortic Valve: The aortic valve is tricuspid. Aortic valve regurgitation is not visualized. Pulmonic Valve: The pulmonic valve was grossly normal. Pulmonic valve regurgitation is trivial. Aorta: The aortic root and ascending aorta are structurally normal, with no evidence of dilitation. IAS/Shunts: No atrial level shunt detected by color flow Doppler. Additional Comments: 3D imaging was not performed.  LEFT VENTRICLE PLAX 2D LVIDd:         5.50 cm      Diastology LVIDs:         3.80 cm      LV e'  medial:    6.31 cm/s LV PW:         1.10 cm      LV E/e' medial:  16.2 LV IVS:        1.20 cm      LV e' lateral:   9.14 cm/s LVOT diam:     2.10 cm      LV E/e' lateral: 11.2 LV SV:         67 LV SV Index:   32 LVOT Area:     3.46 cm  LV Volumes (MOD) LV vol d, MOD A2C: 116.0 ml LV vol d, MOD A4C: 165.0 ml LV vol s, MOD A2C: 75.5 ml LV vol s, MOD A4C: 109.0 ml LV SV MOD A2C:     40.5 ml LV SV MOD A4C:     165.0 ml LV SV MOD BP:      49.7 ml RIGHT VENTRICLE RV Basal diam:  4.20 cm RV S prime:     11.40 cm/s TAPSE (M-mode): 1.8 cm LEFT ATRIUM             Index        RIGHT ATRIUM           Index LA diam:        4.50 cm 2.16 cm/m   RA Area:     15.00 cm LA Vol (A2C):   88.4 ml 42.44 ml/m  RA Volume:   40.50 ml  19.44 ml/m LA Vol (A4C):   88.3 ml 42.39 ml/m LA Biplane Vol: 94.2 ml 45.22 ml/m  AORTIC VALVE LVOT Vmax:   90.80 cm/s LVOT Vmean:  67.200 cm/s LVOT VTI:    0.194 m  AORTA Ao Root diam: 3.40 cm Ao Asc diam:  4.00 cm MITRAL VALVE MV Area (PHT): 3.39 cm     SHUNTS MV Decel Time: 224 msec     Systemic VTI:  0.19 m MV E velocity: 102.00  cm/s  Systemic Diam: 2.10 cm MV A velocity: 45.40 cm/s MV E/A ratio:  2.25 Dietrich Pates MD Electronically signed by Dietrich Pates MD Signature Date/Time: 07/20/2023/5:35:06 PM    Final     Scheduled Meds:  amLODipine  10 mg Oral Daily   aspirin EC  81 mg Oral QHS   cloNIDine  0.1 mg Oral BID   enoxaparin (LOVENOX) injection  30 mg Subcutaneous Q24H   furosemide  40 mg Intravenous Daily   gabapentin  300 mg Oral QHS   hydrALAZINE  100 mg Oral TID   insulin aspart  0-15 Units Subcutaneous TID WC   insulin aspart  0-5 Units Subcutaneous QHS   insulin glargine-yfgn  10 Units Subcutaneous QHS   melatonin  6 mg Oral Once   rosuvastatin  10 mg Oral Daily   tamsulosin  0.4 mg Oral Daily   Continuous Infusions:   LOS: 4 days   Time spent: 58 mins  Nehemiah Montee Laural Benes, MD How to contact the [facility] Attending or Consulting provider 7A - 7P or covering provider during after  hours 7P -7A, for this patient?  Check the care team in [facility] and look for a) attending/consulting [facility] provider listed and b) the [facility] team listed Log into [email or web address] to find provider on call.  Locate the [facility] provider you are looking for under [facility] and page to a number that you can be directly reached. If you still have difficulty reaching the provider, please page the [facility] (Director on Call) for the Hospitalists listed on amion for assistance.  07/22/2023, 12:13 PM

## 2023-07-22 NOTE — Plan of Care (Signed)
  Problem: Education: Goal: Knowledge of General Education information will improve Description: Including pain rating scale, medication(s)/side effects and non-pharmacologic comfort measures Outcome: Progressing   Problem: Health Behavior/Discharge Planning: Goal: Ability to manage health-related needs will improve Outcome: Progressing   Problem: Clinical Measurements: Goal: Ability to maintain clinical measurements within normal limits will improve Outcome: Progressing Goal: Will remain free from infection Outcome: Progressing Goal: Diagnostic test results will improve Outcome: Progressing Goal: Respiratory complications will improve Outcome: Progressing Goal: Cardiovascular complication will be avoided Outcome: Progressing   Problem: Activity: Goal: Risk for activity intolerance will decrease Outcome: Progressing   Problem: Nutrition: Goal: Adequate nutrition will be maintained Outcome: Progressing   Problem: Coping: Goal: Level of anxiety will decrease Outcome: Progressing   Problem: Elimination: Goal: Will not experience complications related to bowel motility Outcome: Progressing Goal: Will not experience complications related to urinary retention Outcome: Progressing   Problem: Pain Managment: Goal: General experience of comfort will improve and/or be controlled Outcome: Progressing   Problem: Safety: Goal: Ability to remain free from injury will improve Outcome: Progressing   Problem: Skin Integrity: Goal: Risk for impaired skin integrity will decrease Outcome: Progressing   Problem: Education: Goal: Ability to describe self-care measures that may prevent or decrease complications (Diabetes Survival Skills Education) will improve Outcome: Progressing Goal: Individualized Educational Video(s) Outcome: Progressing   Problem: Coping: Goal: Ability to adjust to condition or change in health will improve Outcome: Progressing   Problem: Fluid  Volume: Goal: Ability to maintain a balanced intake and output will improve Outcome: Progressing   Problem: Health Behavior/Discharge Planning: Goal: Ability to identify and utilize available resources and services will improve Outcome: Progressing Goal: Ability to manage health-related needs will improve Outcome: Progressing   Problem: Metabolic: Goal: Ability to maintain appropriate glucose levels will improve Outcome: Progressing   Problem: Nutritional: Goal: Maintenance of adequate nutrition will improve Outcome: Progressing Goal: Progress toward achieving an optimal weight will improve Outcome: Progressing   Problem: Skin Integrity: Goal: Risk for impaired skin integrity will decrease Outcome: Progressing   Problem: Tissue Perfusion: Goal: Adequacy of tissue perfusion will improve Outcome: Progressing   Problem: Education: Goal: Ability to demonstrate management of disease process will improve Outcome: Progressing Goal: Ability to verbalize understanding of medication therapies will improve Outcome: Progressing Goal: Individualized Educational Video(s) Outcome: Progressing   Problem: Activity: Goal: Capacity to carry out activities will improve Outcome: Progressing   Problem: Cardiac: Goal: Ability to achieve and maintain adequate cardiopulmonary perfusion will improve Outcome: Progressing   Problem: Activity: Goal: Ability to tolerate increased activity will improve Outcome: Progressing   Problem: Clinical Measurements: Goal: Ability to maintain a body temperature in the normal range will improve Outcome: Progressing   Problem: Respiratory: Goal: Ability to maintain adequate ventilation will improve Outcome: Progressing Goal: Ability to maintain a clear airway will improve Outcome: Progressing

## 2023-07-22 NOTE — Care Management Important Message (Signed)
 Important Message  Patient Details  Name: Jonathan Avery MRN: [id number] Date of Birth: Sep 09, 1930   Important Message Given:  Yes - Medicare IM     Corey Harold 07/22/2023, 11:42 AM

## 2023-07-22 NOTE — Discharge Instructions (Signed)
 IMPORTANT INFORMATION: PAY CLOSE ATTENTION   PHYSICIAN DISCHARGE INSTRUCTIONS  Follow with Primary care provider  Jonathan Perches, MD  and other consultants as instructed by your Hospitalist Physician  SEEK MEDICAL CARE OR RETURN TO EMERGENCY ROOM IF SYMPTOMS COME BACK, WORSEN OR NEW PROBLEM DEVELOPS   Please note: You were cared for by a hospitalist during your hospital stay. Every effort will be made to forward records to your primary care provider.  You can request that your primary care provider send for your hospital records if they have not received them.  Once you are discharged, your primary care physician will handle any further medical issues. Please note that NO REFILLS for any discharge medications will be authorized once you are discharged, as it is imperative that you return to your primary care physician (or establish a relationship with a primary care physician if you do not have one) for your post hospital discharge needs so that they can reassess your need for medications and monitor your lab values.  Please get a complete blood count and chemistry panel checked by your Primary MD at your next visit, and again as instructed by your Primary MD.  Get Medicines reviewed and adjusted: Please take all your medications with you for your next visit with your Primary MD  Laboratory/radiological data: Please request your Primary MD to go over all hospital tests and procedure/radiological results at the follow up, please ask your primary care provider to get all Hospital records sent to his/her office.  In some cases, they will be blood work, cultures and biopsy results pending at the time of your discharge. Please request that your primary care provider follow up on these results.  If you are diabetic, please bring your blood sugar readings with you to your follow up appointment with primary care.    Please call and make your follow up appointments as soon as possible.    Also Note the  following: If you experience worsening of your admission symptoms, develop shortness of breath, life threatening emergency, suicidal or homicidal thoughts you must seek medical attention immediately by calling 911 or calling your MD immediately  if symptoms less severe.  You must read complete instructions/literature along with all the possible adverse reactions/side effects for all the Medicines you take and that have been prescribed to you. Take any new Medicines after you have completely understood and accpet all the possible adverse reactions/side effects.   Do not drive when taking Pain medications or sleeping medications (Benzodiazepines)  Do not take more than prescribed Pain, Sleep and Anxiety Medications. It is not advisable to combine anxiety,sleep and pain medications without talking with your primary care practitioner  Special Instructions: If you have smoked or chewed Tobacco  in the last 2 yrs please stop smoking, stop any regular Alcohol  and or any Recreational drug use.  Wear Seat belts while driving.  Do not drive if taking any narcotic, mind altering or controlled substances or recreational drugs or alcohol.

## 2023-07-22 NOTE — Progress Notes (Signed)
 Mobility Specialist Progress Note:    07/22/23 1157  Mobility  Activity Ambulated with assistance in hallway;Ambulated with assistance to bathroom  Level of Assistance Modified independent, requires aide device or extra time  Assistive Device None  Distance Ambulated (ft) 80 ft  Range of Motion/Exercises Active;All extremities  Activity Response Tolerated well  Mobility Referral Yes  Mobility visit 1 Mobility  Mobility Specialist Start Time (ACUTE ONLY) 1120  Mobility Specialist Stop Time (ACUTE ONLY) 1140  Mobility Specialist Time Calculation (min) (ACUTE ONLY) 20 min   Pt received in bed, daughter at bedside. Agreeable to mobility, ModI to stand and ambulate with no AD. Tolerated well, SpO2 93% on RA at rest. SpO2 89-91% on RA during ambulation. SpO2 dropped to 87% on RA after ambulating, recovered to 94% on 1L. Left pt supine, all needs met.   Jonathan Avery Mobility Specialist Please contact via Special educational needs teacher or  Rehab office at [phone]

## 2023-07-22 NOTE — Progress Notes (Addendum)
 Rounding Note    Patient Name: Jonathan Avery Date of Encounter: 07/22/2023  [facility] Cardiologist: Dina Rich, MD   Subjective   UOP -2.6L. Patient reports he is feeling back to normal. Says he is to go home today. No chest pain reported. Hgb 10.3>9.5. Scr 2.03.  Inpatient Medications    Scheduled Meds:  amLODipine  10 mg Oral Daily   aspirin EC  81 mg Oral QHS   cloNIDine  0.1 mg Oral BID   enoxaparin (LOVENOX) injection  30 mg Subcutaneous Q24H   furosemide  40 mg Intravenous Daily   gabapentin  300 mg Oral QHS   hydrALAZINE  100 mg Oral TID   insulin aspart  0-15 Units Subcutaneous TID WC   insulin aspart  0-5 Units Subcutaneous QHS   insulin glargine-yfgn  10 Units Subcutaneous QHS   melatonin  6 mg Oral Once   rosuvastatin  10 mg Oral Daily   tamsulosin  0.4 mg Oral Daily    PRN Meds: acetaminophen **OR** acetaminophen, ondansetron **OR** ondansetron (ZOFRAN) IV, polyethylene glycol   Vital Signs    Vitals:   07/21/23 2015 07/22/23 0056 07/22/23 0500 07/22/23 0527  BP: (!) 157/65 130/64  136/62  Pulse: 74 71  68  Resp: 19 18  20   Temp: 97.9 F (36.6 C) 98.4 F (36.9 C)  97.8 F (36.6 C)  TempSrc: Oral Oral  Oral  SpO2: 90% 92%  91%  Weight:   90.4 kg   Height:        Intake/Output Summary (Last 24 hours) at 07/22/2023 0829 Last data filed at 07/22/2023 0700 Gross per 24 hour  Intake 480 ml  Output 2600 ml  Net -2120 ml      07/22/2023    5:00 AM 07/21/2023    4:54 AM 07/20/2023    5:00 AM  Last 3 Weights  Weight (lbs) 199 lb 6.4 oz 204 lb 9.4 oz 203 lb 14.8 oz  Weight (kg) 90.447 kg 92.8 kg 92.5 kg      Telemetry    NSR HR 60s, PVCs - Personally Reviewed  Physical Exam   GEN: No acute distress.   Neck: No JVD Cardiac: RRR, no murmurs, rubs, or gallops.  Respiratory: diminished at bases GI: Soft, nontender, non-distended  MS: No edema; No deformity. Neuro:  Nonfocal  Psych: Normal affect   Labs    High  Sensitivity Troponin:   Recent Labs  Lab 07/18/23 1147 07/18/23 1418  TROPONINIHS 101* 103*     Chemistry Recent Labs  Lab 07/18/23 1147 07/19/23 0434 07/20/23 0409 07/21/23 0356 07/22/23 0408  NA 132*   < > 136 136 137  K 4.8   < > 4.4 4.2 4.2  CL 105   < > 106 105 107  CO2 18*   < > 21* 22 23  GLUCOSE 209*   < > 141* 143* 106*  BUN 36*   < > 45* 53* 54*  CREATININE 1.92*   < > 2.14* 2.24* 2.03*  CALCIUM 8.4*   < > 8.0* 8.2* 8.1*  MG  --   --   --   --  1.9  PROT 6.3*  --   --   --   --   ALBUMIN 3.1*  --   --   --  2.6*  AST 17  --   --   --   --   ALT 9  --   --   --   --  ALKPHOS 50  --   --   --   --   BILITOT 1.2  --   --   --   --   GFRNONAA 33*   < > 29* 27* 31*  ANIONGAP 9   < > 9 9 7    < > = values in this interval not displayed.     Hematology Recent Labs  Lab 07/18/23 1147 07/21/23 1113 07/22/23 0408  WBC 8.9 6.1 5.9  RBC 3.32* 3.42* 3.16*  HGB 10.2* 10.3* 9.5*  HCT 31.1* 31.3* 29.4*  MCV 93.7 91.5 93.0  MCH 30.7 30.1 30.1  MCHC 32.8 32.9 32.3  RDW 12.9 12.5 12.4  PLT 128* 195 198   Thyroid  Recent Labs  Lab 07/21/23 1113  TSH 2.091    BNP Recent Labs  Lab 07/18/23 1517  BNP 639.0*    DDimer  Recent Labs  Lab 07/18/23 1147  DDIMER 1.13*     Radiology    US Venous Img Lower Bilateral (DVT) Result Date: 07/21/2023 CLINICAL DATA:  141700 Pulmonary embolism (HCC) 141700 EXAM: BILATERAL LOWER EXTREMITY VENOUS DOPPLER ULTRASOUND TECHNIQUE: Gray-scale sonography with graded compression, as well as color Doppler and duplex ultrasound were performed to evaluate the lower extremity deep venous systems from the level of the common femoral vein and including the common femoral, femoral, profunda femoral, popliteal and calf veins including the posterior tibial, peroneal and gastrocnemius veins when visible. The superficial great saphenous vein was also interrogated. Spectral Doppler was utilized to evaluate flow at rest and with distal  augmentation maneuvers in the common femoral, femoral and popliteal veins. COMPARISON:  CTA chest, 07/18/2023.  MR, 05/01/2021 FINDINGS: RIGHT LOWER EXTREMITY VENOUS Normal compressibility of the RIGHT common femoral, superficial femoral, and popliteal veins, as well as the visualized calf veins. Visualized portions of profunda femoral vein and great saphenous vein unremarkable. No filling defects to suggest DVT on grayscale or color Doppler imaging. Doppler waveforms show normal direction of venous flow, normal respiratory plasticity and response to augmentation. OTHER No evidence of superficial thrombophlebitis. At the RIGHT upper thigh is a multi-septate, anechoic and avascular fluid collection measuring approximately 7.1 x 2 1 x 6.6 cm. Limitations: none LEFT LOWER EXTREMITY VENOUS Normal compressibility of the LEFT common femoral, superficial femoral, as well as the visualized portions of the profunda femoral and greater saphenous vein. The calf veins are difficult to evaluate. Heterogeneously-echogenic partially-occlusive filling defect with noncompressibility of the imaged portions of the LEFT popliteal vein. OTHER No evidence of superficial thrombophlebitis or abnormal fluid collection. Limitations: none IMPRESSION: 1. Subacute-to-chronic appearing, partially-occlusive DVT within the LEFT popliteal vein. 2. No evidence of femoropopliteal DVT or superficial thrombophlebitis within the RIGHT lower extremity. 3. 7 cm RIGHT upper thigh septated subcutaneous fluid collection, incompletely assessed. If continued clinical concern, consider musculoskeletal MR for further evaluation Jonathan Banning, MD [facility] Radiology Electronically Signed   By: Jonathan Avery M.D.   On: 07/21/2023 17:25   ECHOCARDIOGRAM COMPLETE Result Date: 07/20/2023    ECHOCARDIOGRAM REPORT   Patient Name:   [name] Date of Exam: 07/20/2023 Medical Rec #:  [id number]     Height:       69.0  in Accession #:    [id number]    Weight:       203.9 lb Date of Birth:  [birth year]     BSA:          2.083 m Patient Age:    89 years  BP:           127/42 mmHg Patient Gender: M             HR:           73 bpm. [facility] Procedure: 2D Echo, Cardiac Doppler, Color Doppler and Intracardiac            Opacification Agent (Both Spectral and Color Flow Doppler were            utilized during procedure). Indications:    Swelling [[id number]]  History:        Patient has prior history of Echocardiogram examinations, most                 recent 04/07/2018. CHF; Risk Factors:Hypertension and Diabetes.  Sonographer:    Webb Laws Referring Phys: [id number] EJIROGHENE E EMOKPAE IMPRESSIONS  1. Severe diffuse hypokinesis worse in the anterior, inferior and septal walls; apical akinesis.. Left ventricular ejection fraction, by estimation, is 25%. The left ventricle has severely decreased function. The left ventricle demonstrates global hypokinesis. The left ventricular internal cavity size was mildly dilated. There is mild left ventricular hypertrophy. Left ventricular diastolic parameters are consistent with Grade II diastolic dysfunction (pseudonormalization).  2. Right ventricular systolic function is mildly reduced. The right ventricular size is normal.  3. Left atrial size was moderately dilated.  4. The mitral valve is normal in structure. Mild mitral valve regurgitation.  5. The aortic valve is tricuspid. Aortic valve regurgitation is not visualized. FINDINGS  Left Ventricle: Severe diffuse hypokinesis worse in the anterior, inferior and septal walls; apical akinesis. Left ventricular ejection fraction, by estimation, is 25%. The left ventricle has severely decreased function. The left ventricle demonstrates global hypokinesis. Definity contrast agent was given IV to delineate the left ventricular endocardial borders. Strain imaging was not performed. The left ventricular internal cavity size was mildly  dilated. There is mild left ventricular hypertrophy. Left ventricular diastolic parameters are consistent with Grade II diastolic dysfunction (pseudonormalization). Right Ventricle: The right ventricular size is normal. Right vetricular wall thickness was not assessed. Right ventricular systolic function is mildly reduced. Left Atrium: Left atrial size was moderately dilated. Right Atrium: Right atrial size was normal in size. Pericardium: There is no evidence of pericardial effusion. Mitral Valve: The mitral valve is normal in structure. Mild mitral valve regurgitation. Tricuspid Valve: The tricuspid valve is normal in structure. Tricuspid valve regurgitation is trivial. Aortic Valve: The aortic valve is tricuspid. Aortic valve regurgitation is not visualized. Pulmonic Valve: The pulmonic valve was grossly normal. Pulmonic valve regurgitation is trivial. Aorta: The aortic root and ascending aorta are structurally normal, with no evidence of dilitation. IAS/Shunts: No atrial level shunt detected by color flow Doppler. Additional Comments: 3D imaging was not performed.  LEFT VENTRICLE PLAX 2D LVIDd:         5.50 cm      Diastology LVIDs:         3.80 cm      LV e' medial:    6.31 cm/s LV PW:         1.10 cm      LV E/e' medial:  16.2 LV IVS:        1.20 cm      LV e' lateral:   9.14 cm/s LVOT diam:     2.10 cm      LV E/e' lateral: 11.2 LV SV:         67 LV SV Index:  32 LVOT Area:     3.46 cm  LV Volumes (MOD) LV vol d, MOD A2C: 116.0 ml LV vol d, MOD A4C: 165.0 ml LV vol s, MOD A2C: 75.5 ml LV vol s, MOD A4C: 109.0 ml LV SV MOD A2C:     40.5 ml LV SV MOD A4C:     165.0 ml LV SV MOD BP:      49.7 ml RIGHT VENTRICLE RV Basal diam:  4.20 cm RV S prime:     11.40 cm/s TAPSE (M-mode): 1.8 cm LEFT ATRIUM             Index        RIGHT ATRIUM           Index LA diam:        4.50 cm 2.16 cm/m   RA Area:     15.00 cm LA Vol (A2C):   88.4 ml 42.44 ml/m  RA Volume:   40.50 ml  19.44 ml/m LA Vol (A4C):   88.3 ml 42.39  ml/m LA Biplane Vol: 94.2 ml 45.22 ml/m  AORTIC VALVE LVOT Vmax:   90.80 cm/s LVOT Vmean:  67.200 cm/s LVOT VTI:    0.194 m  AORTA Ao Root diam: 3.40 cm Ao Asc diam:  4.00 cm MITRAL VALVE MV Area (PHT): 3.39 cm     SHUNTS MV Decel Time: 224 msec     Systemic VTI:  0.19 m MV E velocity: 102.00 cm/s  Systemic Diam: 2.10 cm MV A velocity: 45.40 cm/s MV E/A ratio:  2.25 Dietrich Pates MD Electronically signed by Dietrich Pates MD Signature Date/Time: 07/20/2023/5:35:06 PM    Final     Cardiac Studies   Echo 07/20/2023 1. Severe diffuse hypokinesis worse in the anterior, inferior and septal  walls; apical akinesis.. Left ventricular ejection fraction, by  estimation, is 25%. The left ventricle has severely decreased function.  The left ventricle demonstrates global  hypokinesis. The left ventricular internal cavity size was mildly dilated.  There is mild left ventricular hypertrophy. Left ventricular diastolic  parameters are consistent with Grade II diastolic dysfunction  (pseudonormalization).   2. Right ventricular systolic function is mildly reduced. The right  ventricular size is normal.   3. Left atrial size was moderately dilated.   4. The mitral valve is normal in structure. Mild mitral valve  regurgitation.   5. The aortic valve is tricuspid. Aortic valve regurgitation is not  visualized.   Myoview lexiscan 03/2018 Blood pressure demonstrated a normal response to exercise. There was no ST segment deviation noted during stress. Findings consistent with prior myocardial infarction. This is an intermediate risk study. The left ventricular ejection fraction is moderately decreased (30-44%).   Large inferior wall infarct from apex to base with no ischemia EF 42%  Patient Profile     89 y.o. male with a h/o reported CAD with MI/PTCA at [facility] over 20 years (records in Epic), prior stroke vs TIA, CKD stage 3, BPH, bradycardia requiring cessation of metoprolol, carotid artery disease (1-39%  RICA, 40-59% LICA) who is being seen for acute heart failure.   Assessment & Plan    Newly documented HFrEF - found to have new cardiomyopathy  - echo this admission showed LVEF 25%, G2DD, mildly reduced RVSF, mild MR - no ACS, planning medical management - IV lasix 40mg  BID with net UOP 3500 cc overall - RedsVest 34 - patient appears euvolemic on exam - PTA he was not on lasix, he was on hydrochlorothiazide  -  ARB (olmesartan) held for AKI - continue Hydralazine and add Imdur - Lasix at D/C  Small, chronic PE by chest CTA - suspect this is not contributing to clinical picture - [place] LE sowed subacute to chronic appearing partially occlusive DVT withint the left popliteal vein>management per IM  Elevated troponin History of CAD with stenting - HS troponin 101>103 - no chest pain reported - consistent with demand ischemia, not ACS - continue ASA, Crestor  AKI on CKD stage 3 - Scr baseline 1.3-1.7 - Scr 1.92 on arrival - Scr today 2.03 with diuresis  HTN - ARB held for AKI - amlodipine 10mg  daily - clonidine 0.1mg  BID - hydralazine 100mg  TID - BP normal  New Anemia - Hgb 10.2>9.5 - Hgb 12.8 six months ago - per IM  For questions or updates, please contact [facility] Please consult [email or web address] for contact info under        Signed, Cadence David Stall, PA-C  07/22/2023, 8:29 AM      Attending note:  Patient seen and examined.  I reviewed the chart and spoke with the patient's daughter in the room today.  Mr. Schwenke states that he feels much better and would like to go home.  He is diuresed approximately 3500 cc on IV Lasix, follow-up creatinine 2.03 today which is coming down.  Lower extremity venous Dopplers did show subacute to chronic appearing, partially occlusive DVT within the left popliteal vein.  He had a tiny distal lobar PE involving the right lower lobe consistent with chronic embolus by chest CTA.  Currently not anticoagulated, management per  primary team.  On examination this morning patient appears comfortable, no chest pain or shortness of breath.  He is afebrile, heart rate is in the 70s in sinus rhythm, blood pressure 136/62.  Lungs exhibit decreased breath sounds with no crackles.  Cardiac exam with RRR and soft systolic murmur, no gallop.  Lab work shows potassium 4.2, creatinine 2.03, hemoglobin 9.5, platelets 198.  Current regimen includes aspirin 81 mg daily, Norvasc 10 mg daily, clonidine 0.1 mg twice daily, hydralazine 100 mg 3 times a day, and Crestor 10 mg daily.  If he does go home, recommend adding Imdur 30 mg daily and instead of having him resume Benicar and HCTZ, would replace this with Lasix 20 mg daily.  I talked with him about checking weights daily at home and also recording his blood pressure in case further medication adjustments are necessary.  We will arrange follow-up in our office with Dr. Wyline Mood or APP.  Jonelle Sidle, M.D., F.A.C.C.

## 2023-07-22 NOTE — Progress Notes (Signed)
   07/22/23 0800  ReDS Vest / Clip  Station Marker D  Ruler Value 41  ReDS Value Range < 36  ReDS Actual Value 32

## 2023-07-22 NOTE — TOC Transition Note (Addendum)
 Transition of Care [facility]) - Discharge Note   Patient Details  Name: Jonathan Avery MRN: [id number] Date of Birth: 07/11/1930  [facility] CM/SW Contact:  Beather Arbour Phone Number: 07/22/2023, 11:39 AM   Clinical Narrative:    Patient is discharging today . CSW spoke with pt and daughter at bedside regarding setting up home oxygen . Daughter nor pt had a preference and was agreeable to having [employer] provide home oxygen . CSW spoke with Ian Malkin about home oxygen needs, Ian Malkin able to accept and provide home set up . TOC signing off.   Daughter did ask about pt having a portal to use when in the community . CSW shared information with Ian Malkin. Ian Malkin stated that the portable one would need to be sent under POC evaluation which will take place after the pt is home.   Final next level of care: Home/Self Care Barriers to Discharge: Barriers Resolved   Patient Goals and CMS Choice Patient states their goals for this hospitalization and ongoing recovery are:: return back home CMS Medicare.gov Compare Post Acute Care list provided to:: Patient (Daughter - [name]) Choice offered to / list presented to : Patient, Adult Children [place] ownership interest in [facility].provided to:: Patient    Discharge Placement                  Name of family member notified: Emeka and [name] ( patient and daughter ) Patient and family notified of of transfer: 07/22/23  Discharge Plan and Services Additional resources added to the After Visit Summary for   In-house Referral: Clinical Social Work Discharge Planning Services: CM Consult            DME Arranged: Oxygen DME Agency: [employer] Date DME Agency Contacted: 07/22/23 Time DME Agency Contacted: 1139 Representative spoke with at DME Agency: Beryl Meager Agency: NA        Social Drivers of Health (SDOH) Interventions SDOH Screenings   Food Insecurity: No Food Insecurity (07/18/2023)  Housing: Low Risk   (07/18/2023)  Transportation Needs: No Transportation Needs (07/18/2023)  Utilities: Not At Risk (07/18/2023)  Social Connections: Moderately Isolated (07/19/2023)  Tobacco Use: Medium Risk (07/18/2023)     Readmission Risk Interventions    07/22/2023   11:38 AM 07/19/2023   12:48 PM  Readmission Risk Prevention Plan  Transportation Screening Complete Complete  Home Care Screening Complete Complete  Medication Review (RN CM) Complete Complete

## 2023-07-22 NOTE — Progress Notes (Signed)
 SATURATION QUALIFICATIONS: (This note is used to comply with regulatory documentation for home oxygen)  Patient Saturations on Room Air at Rest = 94%  Patient Saturations on Room Air while Ambulating = 87%  Patient Saturations on 1 Liters of oxygen while Ambulating = 94%  Please briefly explain why patient needs home oxygen: Patient needs home oxygen b/c his oxygen saturations dropped to 87% while ambulating on room air.

## 2023-07-23 ENCOUNTER — Encounter (HOSPITAL_COMMUNITY): Payer: Self-pay | Admitting: Internal Medicine

## 2023-07-23 ENCOUNTER — Other Ambulatory Visit (HOSPITAL_COMMUNITY): Payer: Self-pay

## 2023-07-23 ENCOUNTER — Telehealth (HOSPITAL_COMMUNITY): Payer: Self-pay | Admitting: Pharmacy Technician

## 2023-07-23 DIAGNOSIS — I10 Essential (primary) hypertension: Secondary | ICD-10-CM | POA: Diagnosis not present

## 2023-07-23 DIAGNOSIS — E1322 Other specified diabetes mellitus with diabetic chronic kidney disease: Secondary | ICD-10-CM | POA: Diagnosis not present

## 2023-07-23 DIAGNOSIS — J9601 Acute respiratory failure with hypoxia: Secondary | ICD-10-CM | POA: Diagnosis not present

## 2023-07-23 DIAGNOSIS — I5021 Acute systolic (congestive) heart failure: Secondary | ICD-10-CM | POA: Diagnosis not present

## 2023-07-23 LAB — RENAL FUNCTION PANEL
Albumin: 2.7 g/dL — ABNORMAL LOW (ref 3.5–5.0)
Anion gap: 6 (ref 5–15)
BUN: 51 mg/dL — ABNORMAL HIGH (ref 8–23)
CO2: 23 mmol/L (ref 22–32)
Calcium: 8.2 mg/dL — ABNORMAL LOW (ref 8.9–10.3)
Chloride: 107 mmol/L (ref 98–111)
Creatinine, Ser: 1.97 mg/dL — ABNORMAL HIGH (ref 0.61–1.24)
GFR, Estimated: 32 mL/min — ABNORMAL LOW (ref >60)
Glucose, Bld: 138 mg/dL — ABNORMAL HIGH (ref 70–99)
Phosphorus: 3.6 mg/dL (ref 2.5–4.6)
Potassium: 3.8 mmol/L (ref 3.5–5.1)
Sodium: 136 mmol/L (ref 135–145)

## 2023-07-23 LAB — CBC
HCT: 30.2 % — ABNORMAL LOW (ref 39.0–52.0)
Hemoglobin: 9.8 g/dL — ABNORMAL LOW (ref 13.0–17.0)
MCH: 30.5 pg (ref 26.0–34.0)
MCHC: 32.5 g/dL (ref 30.0–36.0)
MCV: 94.1 fL (ref 80.0–100.0)
Platelets: 216 10*3/uL (ref 150–400)
RBC: 3.21 MIL/uL — ABNORMAL LOW (ref 4.22–5.81)
RDW: 12.5 % (ref 11.5–15.5)
WBC: 5.8 10*3/uL (ref 4.0–10.5)
nRBC: 0 % (ref 0.0–0.2)

## 2023-07-23 LAB — GLUCOSE, CAPILLARY
Glucose-Capillary: 136 mg/dL — ABNORMAL HIGH (ref 70–99)
Glucose-Capillary: 300 mg/dL — ABNORMAL HIGH (ref 70–99)

## 2023-07-23 MED ORDER — ISOSORBIDE MONONITRATE ER 60 MG PO TB24: 30.0000 mg | ORAL_TABLET | Freq: Every day | ORAL | Status: DC

## 2023-07-23 MED ORDER — APIXABAN 2.5 MG PO TABS
2.5000 mg | ORAL_TABLET | Freq: Two times a day (BID) | ORAL | Status: DC
  Administered 2023-07-23: 2.5 mg via ORAL
  Filled 2023-07-23: qty 1

## 2023-07-23 MED ORDER — FUROSEMIDE 40 MG PO TABS: 40.0000 mg | ORAL_TABLET | Freq: Every day | ORAL | 2 refills | Status: AC

## 2023-07-23 MED ORDER — VITAMIN D3 10 MCG (400 UNIT) PO CAPS: 800.0000 [IU] | ORAL_CAPSULE | Freq: Every day | ORAL | Status: AC

## 2023-07-23 MED ORDER — FUROSEMIDE 40 MG PO TABS: 40.0000 mg | ORAL_TABLET | Freq: Every day | ORAL | Status: DC

## 2023-07-23 MED ORDER — LANTUS SOLOSTAR 100 UNIT/ML ~~LOC~~ SOPN: 12.0000 [IU] | PEN_INJECTOR | Freq: Every day | SUBCUTANEOUS | Status: AC

## 2023-07-23 MED ORDER — APIXABAN 2.5 MG PO TABS: 2.5000 mg | ORAL_TABLET | Freq: Two times a day (BID) | ORAL | 1 refills | Status: DC

## 2023-07-23 MED ORDER — ISOSORBIDE MONONITRATE ER 30 MG PO TB24: 30.0000 mg | ORAL_TABLET | Freq: Every day | ORAL | 2 refills | Status: AC

## 2023-07-23 NOTE — Progress Notes (Addendum)
 Patient Name: Jonathan Avery Date of Encounter: 07/23/2023 [facility] Cardiologist: Dina Rich, MD   Interval Summary  .    UOP .kidney function stable. After family discussion, plan to use a/c for DVT . Patient denies chest pain. Says he will go home with supplemental O2. CBC pending  Vital Signs .    Vitals:   07/22/23 1241 07/22/23 1651 07/22/23 2153 07/23/23 0633  BP: (!) 123/47 (!) 144/61 (!) 142/61 (!) 147/62  Pulse: 68 71 67 70  Resp: 18  18 18   Temp: 98 F (36.7 C)  98.6 F (37 C) 98.4 F (36.9 C)  TempSrc: Oral  Oral Oral  SpO2: 94%  94% 97%  Weight:    88.9 kg  Height:        Intake/Output Summary (Last 24 hours) at 07/23/2023 0800 Last data filed at 07/22/2023 2155 Gross per 24 hour  Intake 240 ml  Output 900 ml  Net -660 ml      07/23/2023    6:33 AM 07/22/2023    5:00 AM 07/21/2023    4:54 AM  Last 3 Weights  Weight (lbs) 195 lb 15.8 oz 199 lb 6.4 oz 204 lb 9.4 oz  Weight (kg) 88.9 kg 90.447 kg 92.8 kg      Telemetry/ECG    NSR NSVT 4beats, HR 60s - Personally Reviewed  Physical Exam .   GEN: No acute distress.   Neck: No JVD Cardiac: RRR, no murmurs, rubs, or gallops.  Respiratory: Clear to auscultation bilaterally. GI: Soft, nontender, non-distended  MS: No edema  Assessment & Plan .     Newly documented HFrEF - found to have new cardiomyopathy  - echo this admission showed LVEF 25%, G2DD, mildly reduced RVSF, mild MR - no ACS, planning medical management - IV lasix 40mg  daily with net UOP 3558 cc overall - patient appears euvolemic on exam - PTA he was not on lasix, he was on hydrochlorothiazide  - ARB (olmesartan) held for AKI - continue Hydralazine and add Imdur - CKD limiting GDMT - Lasix 40mg  at D/C   Small, chronic PE by chest CTA - suspect this is not contributing to clinical picture - [place] LE sowed subacute to chronic appearing partially occlusive DVT withint the left popliteal vein>plan for a/c per IM    Elevated troponin History of CAD with stenting - HS troponin 101>103 - no chest pain reported - consistent with demand ischemia, not ACS - continue ASA, Crestor   AKI on CKD stage 3 - Scr baseline 1.3-1.7 - Scr 1.92 on arrival - Scr today 1.97   HTN - ARB held for AKI - amlodipine 10mg  daily - clonidine 0.1mg  BID - hydralazine 100mg  TID - BP normal   New Anemia - Hgb 10.2>9.5 - Hgb 12.8 six months ago - per IM  For questions or updates, please contact [facility] Please consult [email or web address] for contact info under        Signed, Cadence David Stall, PA-C    Attending Note   Paitent seen and discussed with PA Further, I agree with her documentation.      1.Acute HFrEF - 2019 echo: LVE 55-60%, grade I dd - 06/2023 echo: LVE 25%, grade II dd, mild RV dysfunction - BNP 639, CXR no acute process. CT PE pulm edema vs atypical infection, small bilateral effusions.    - on IV lasix 40mg  daily. I/Os are incomplete. Weights if accurate 204-->199-->195 lbs. Clinic wt 01/2023 193 lbs.  Reds  vest today 32%. Mild downtrned in Cr with diuresis.  - appears euvolemic, got IV lasix this morning. Start oral lasix 40mg  daily tomorrow.    - intolerant to beta blockers due to bradycardia -GFR 32 avoid ACE/ARB/ARNI/SGLT2i/MRA - medical therapy with hydral 100mg  tid. Add imdur 30mg  daily.   -poor cath canididate with advanced age, renal dysfunction GFR 32     2. PE - CT PE with very small chronic PE - LE Dopplers subacute to chronic DVT left popliteal - per primary team       4. AKI on CKD - baseline Cr 1.3, admitted Cr 1.9     5. CAD - - notes metion history of MI 20 years ago with PTCA at [facility]. No records available in epic    [facility] for discharge, we will arrange outpatient f/u. We will sign off inpatient care.   Dina Rich MD

## 2023-07-23 NOTE — Progress Notes (Incomplete)
Attending Note  Paitent seen and discussed with PA Further, I agree with her documentation.    1.Acute HFrEF - 2019 echo: LVE 55-60%, grade I dd - 06/2023 echo: LVE 25%, grade II dd, mild RV dysfunction - BNP 639, CXR no acute process. CT PE pulm edema vs atypical infection, small bilateral effusions.   - on IV lasix 40mg  daily. I/Os are incomplete. Weights if accurate 204-->199-->195 lbs. Clinic wt 01/2023 193 lbs.  Reds vest today 32%. Mild downtrned in Cr with diuresis.   - intolerant to beta blockers due to bradycardia -GFR 32 avoid ACE/ARB/ARNI/SGLT2i/MRA - medical therapy with hydral 100mg  tid. Add imdur 30mg  daily.    -poor cath canididate with advanced age, renal dysfunction GFR 32   2. PE - CT PE with very small chronic PE - LE Dopplers subacute to chronic DVT left popliteal      4. AKI on CKD - baseline Cr 1.3, admitted Cr 1.9   5. CAD - - notes metion history of MI 20 years ago with PTCA at [facility]. No records available in epic

## 2023-07-23 NOTE — Telephone Encounter (Signed)
 Patient Product/process development scientist completed.    The patient is insured through [employer]. Patient has Medicare and is not eligible for a copay card, but may be able to apply for patient assistance or Medicare RX Payment Plan (Patient Must reach out to their plan, if eligible for payment plan), if available.    Ran test claim for Eliquis 5 mg and the current 30 day co-pay is $297.00 due to a $250.00 deductible.  Will be $47.00 once deductible is met.   This test claim was processed through [facility]- copay amounts may vary at other pharmacies due to pharmacy/plan contracts, or as the patient moves through the different stages of their insurance plan.     Roland Earl, CPHT Pharmacy Technician III Certified Patient Advocate [facility] Patient Advocate Team Direct Number: [phone]  Fax: [phone]

## 2023-07-23 NOTE — Consult Note (Signed)
 [facility] Liaison Note  07/23/2023  Jonathan Avery 1931-03-02 [id number]  Location: RN Hospital Liaison screened the patient remotely at [facility].  Insurance: [facility] HMO   Jonathan Avery is a 89 y.o. male who is a Primary Care Patient of Carylon Perches, MD [place] [place]). The patient was screened for  day readmission hospitalization with noted medium risk score for unplanned readmission risk with 1 IP in 6 months.  The patient was assessed for potential Care Management service needs for post hospital transition for care coordination. Review of patient's electronic medical record reveals patient was admitted Acute hypoxia respiratory failure. Pt discharged home self care with home oxygen set up. No anticipated needs for VBCI to address at this time.  Plan: [facility] Liaison will continue to follow progress and disposition to asess for post hospital community care coordination/management needs.  Referral request for community care coordination: anticipate Transitions of Care Team follow up.   VBCI Care Management/Population Health does not replace or interfere with any arrangements made by the Inpatient Transition of Care team.   For questions contact:   Elliot Cousin, RN, BSN Hospital Liaison [place]   [facility], Population Health Office Hours MTWF  8:00 am-6:00 pm Direct Dial: [phone] mobile [email or web address]

## 2023-07-23 NOTE — Plan of Care (Signed)

## 2023-07-23 NOTE — Progress Notes (Signed)
 Heart Failure Nurse Navigator Progress Note  PCP: Carylon Perches, MD PCP-Cardiologist: Myna Hidalgo, MD Admission Diagnosis:  Dyspnea, unspecified  Hypoxia Admitted from: Home  Presentation:   Jonathan Avery presented with shortness of breath, fatigue, weakness and decreased appetite. BNP 639.0  Remote phone interview was performed on 07/23/23 @ 11:20 AM from [facility].  Patient and DOB identifiers were confirmed prior to starting telephone interview.  Patient preferred his daughter Jonathan Avery to be the recipient of updating Social Drivers of Health and Heart Failure Education.  His nurse Carollee Herter was called to deliver a Heart Failure Education folder to his bedside for discharge.   ECHO/ LVEF: 25%  Clinical Course:  Past Medical History:  Diagnosis Date   BPH (benign prostatic hypertrophy)    Cancer (HCC)    skin cancer - basal   Carotid artery occlusion    Diabetes (HCC)    Type II   High cholesterol    Hypertension    Myocardial infarction [facility])    Peripheral vascular disease (HCC)    Stroke (HCC)      documented 12/28/2019- ? TIA  " weeks 3 weeks ago, expresive aphasia lasted 2 -3 minutes."      Social History   Socioeconomic History   Marital status: Widowed    Spouse name: Not on file   Number of children: 2   Years of education: Not on file   Highest education level: 12th grade  Occupational History   Not on file  Tobacco Use   Smoking status: Former    Types: Cigars   Smokeless tobacco: Never  Vaping Use   Vaping status: Never Used  Substance and Sexual Activity   Alcohol use: No    Alcohol/week: 0.0 standard drinks of alcohol   Drug use: No   Sexual activity: Not Currently  Other Topics Concern   Not on file  Social History Narrative   Not on file   Social Drivers of Health   Financial Resource Strain: Low Risk  (07/23/2023)   Overall Financial Resource Strain (CARDIA)    Difficulty of Paying Living Expenses: Not hard at all  Food Insecurity: No Food  Insecurity (07/23/2023)   Hunger Vital Sign    Worried About Running Out of Food in the Last Year: Never true    Ran Out of Food in the Last Year: Never true  Transportation Needs: No Transportation Needs (07/23/2023)   PRAPARE - Administrator, Civil Service (Medical): No    Lack of Transportation (Non-Medical): No  Physical Activity: Not on file  Stress: Not on file  Social Connections: Moderately Isolated (07/19/2023)   Social Connection and Isolation Panel [NHANES]    Frequency of Communication with Friends and Family: More than three times a week    Frequency of Social Gatherings with Friends and Family: More than three times a week    Attends Religious Services: 1 to 4 times per year    Active Member of [employer] or Organizations: No    Attends Banker Meetings: Never    Marital Status: Widowed   Education Assessment and Provision:  Detailed education and instructions provided on heart failure disease management including the following:  Signs and symptoms of Heart Failure When to call the physician Importance of daily weights Low sodium diet Fluid restriction Medication management Anticipated future follow-up appointments  Patient education given on each of the above topics.  Patient acknowledges understanding via teach back method and acceptance of all instructions.  His nurse Carollee Herter was called so she could provide him with Education Materials:  "Living Better With Heart Failure" folder, HF zone tool, & Daily Weight Tracker Tool.  Patient has scale at home: Yes. Patient has not been weighing himself daily.  Education provide on importance of doing this daily. Patient has pill box at home: Yes.  Daughter Jonathan Avery) prepares pill boxes ahead of time for patient.  High Risk Criteria for Readmission and/or Poor Patient Outcomes: Heart failure hospital admissions (last 6 months): 0  No Show rate: 0 Difficult social situation: Lives by himself Demonstrates  medication adherence: Yes Primary Language: English Literacy level: Reading, Writing & Comprehension  Barriers of Care:   Lives alone but has family that assists with his daily care Transportation to appointments is provided by family  Considerations/Referrals:   Referral made to Heart Failure Pharmacist Stewardship: No Referral made to Heart Failure CSW/NCM TOC: No Referral made to Heart & Vascular TOC clinic: No.  No Heart Failure TOC scheduled because patient has follow-up already scheduled within a week with Dr. Wyline Mood on 07/29/23 @ 3:40 pm.  Jonathan Avery was provided with appointment date and time during interview.  Items for Follow-up on DC/TOC: Encourage Daily weight and log Diet and Fluid Restrictions-Low sodium diet -patient currently eating more processed food choices. Also educated on decreasing patient fluid intake to 64 oz or less of fluid per day. Patient currently drinks unlimited milk, juice, water daily and occasionally soda.  New Heart Failure Medication compliance Continued Heart Failure Education  Roxy Horseman, RN, BSN [facility] Heart Failure Navigator Secure Chat Only

## 2023-07-23 NOTE — Care Management Important Message (Signed)
 Important Message  Patient Details  Name: Jonathan Avery MRN: [id number] Date of Birth: 09/09/1930   Important Message Given:  Yes - Medicare IM     Corey Harold 07/23/2023, 11:30 AM

## 2023-07-23 NOTE — Discharge Summary (Signed)
 Physician Discharge Summary  LAVON BOTHWELL GNF:[id number] DOB: Jun 17, 1930 DOA: 07/18/2023  PCP: Carylon Perches, MD Cardiology: Branch  Admit date: 07/18/2023 Discharge date: 07/23/2023  Admitted From:  Home  Disposition: Home   Recommendations for Outpatient Follow-up:  Follow up with cardiology on 07/29/23 as scheduled  Please obtain BMP/CBC in 1 week to monitor Hg and renal function and lytes  Discharge Condition: STABLE   CODE STATUS: FULL DIET: Low sodium heart healthy recommended with carb modified low sugar    Brief Hospitalization Summary: Please see all hospital notes, images, labs for full details of the hospitalization. Admission provider HPI:  89 y.o. male with diabetes, hypertension, prior MI CKD stage III, BPH.  He presents with shortness of breath over the last 3 days, as well as bilateral lower extremity swelling over the last 3 weeks.  In the ED patient was found to be tachypneic, O2 sats 87% on 4 L creatinine 1.9.  Troponin 101 with repeat to 103.  D-dimer 1.13.  CTA chest shows nonocclusive thrombus within the distal aspect of the lobar branch of the right lower lobe compatible with a chronic PE, also reveals predominant groundglass opacities and mildly enlarged lymph nodes.  Patient received a 500 cc bolus.  He was initiated on ceftriaxone and azithromycin.  BNP 639, procalcitonin 0.11.  Patient was admitted to [facility] for acute hypoxic respiratory failure. He has been noted to have a new cardiomyopathy with EF now down to 25% with grade 2 DD.    Hospital Course by problem list   Acute hypoxic respiratory failure - Continue supplemental oxygen, wean as tolerated - This is all likely secondary to pulmonary edema, there was question of CAP however procal is less than 1, he is afebrile without leukocytosis.  Will discontinue antibiotics. - CT also reveals nonocclusive thrombus compatible with chronic PE, unlikely contributing to hypoxia - Continue with IV diuretics for now -- Wean O2  as tolerated -- he could only be weaned to 1L/min oxygen likely because of heart failure and pulmonary embolism; he will discharge home on supplemental oxygen and could be weaned in the outpatient setting if able    Acute HFrEF  - updated echocardiogram 2/25 with finding of LVEF 25% with grade 2 DD - cardiology team consulted and recommending med management with GDMT - Continue with IV Lasix daily 40 mg -- at discharge DC home on oral lasix 40 mg daily starting 07/24/23, follow up with Dr. Wyline Mood on 3/6 as scheduled - Followed strict I's and O's -- Reds Vest reading 32 which is reassuring -- DC home on oral lasix per cardiology team recommendations   Intake/Output Summary (Last 24 hours) at 07/22/2023 1213 Last data filed at 07/22/2023 0700    Gross per 24 hour  Intake 240 ml  Output 2600 ml  Net -2360 ml         Filed Weights    07/20/23 0500 07/21/23 0454 07/22/23 0500  Weight: 92.5 kg 92.8 kg 90.4 kg    Chronic pulmonary embolism - Incidentally seen on CT - doubt this is contributing to current clinical picture - Dr. Rennis Chris discussed risks and benefits of anticoagulation with the patient and his family and they have opted to monitor without anticoagulation for now.  They report he is high risk for falls and lives alone.  Have discussed urgent ER return precautions in the event of signs/symptoms new PE or DVT outpatient. -- venous US of bilateral lower extremities with findings of subacute to chronic DVT in  the left popliteal area;  -- discussed results with family again, they are having another family discussion today with patient at center and they are asking for more time to make decision and weigh risks of full anticoagulation, they plan to let me know tomorrow if they want to try full anticoagulation  -- 2/28: discussed with patient and daughter and they have decided to proceed with trial of apixaban given blood clots in leg and lung.  We reviewed the risks. He agrees to have his  hemoglobin tested regularly and close outpatient follow up.  He is on aspirin 81 mg daily.  Will trial of apixaban 2.5 mg BID    AKI on CKD stage IIIa - Baseline creatinine close to 1.3-1.7, 1.92 on arrival.  His new baseline may now be 3b CKD.  - Continue to monitor closely with diuresis and contrast exposure - renal function improved to baseline prior to discharge  -- renally dose medications   History of myocardial infarction / CAD - Currently denies any chest pain - Prior MI over 20 years ago, no records available for review - Will follow-up on echo - Troponin on arrival 101->103.  Doubt ACS.   - Continue with home dose aspirin, Plavix, statin   Hypertension - Resume home meds - DC ARB per cardiology -- outpatient follow up with cardiology on 3/6    Diabetes type 2, with hyperglycemia, with vascular complications, uncontrolled - Hemoglobin A1c 7.1%, reduced basal insulin from 20 units to 12 units to try to avoid hypoglycemia - Will hold metformin given CKD  CBG (last 3)  Recent Labs (last 2 labs)       Recent Labs    07/21/23 2018 07/22/23 0741 07/22/23 1107  GLUCAP 177* 132* 202*      Discharge Diagnoses:  Principal Problem:   Acute hypoxic respiratory failure (HCC) Active Problems:   Diabetes (HCC)   Essential hypertension   Myocardial infarction (HCC)   CKD (chronic kidney disease), stage III (HCC)   Acute congestive heart failure (HCC)  Discharge Instructions:  Allergies as of 07/23/2023   No Known Allergies      Medication List     STOP taking these medications    hydrochlorothiazide 12.5 MG tablet Commonly known as: HYDRODIURIL   metFORMIN 500 MG tablet Commonly known as: GLUCOPHAGE   olmesartan 40 MG tablet Commonly known as: BENICAR       TAKE these medications    ALPHA LIPOIC ACID PO Take 1 capsule by mouth in the morning, at noon, and at bedtime.   amLODipine 10 MG tablet Commonly known as: NORVASC TAKE ONE TABLET (10MG  TOTAL) BY  MOUTH DAILY   apixaban 2.5 MG Tabs tablet Commonly known as: ELIQUIS Take 1 tablet (2.5 mg total) by mouth 2 (two) times daily.   aspirin 81 MG tablet Take 81 mg by mouth at bedtime.   cloNIDine 0.1 MG tablet Commonly known as: CATAPRES TAKE ONE TABLET (0.1MG  TOTAL) BY MOUTH TWO TIMES DAILY   furosemide 40 MG tablet Commonly known as: LASIX Take 1 tablet (40 mg total) by mouth daily. Start taking on: July 24, 2023   gabapentin 300 MG capsule Commonly known as: NEURONTIN Take 1 capsule by mouth at bedtime.   hydrALAZINE 100 MG tablet Commonly known as: APRESOLINE Take 1 tablet (100 mg total) by mouth 3 (three) times daily. Dose change.   isosorbide mononitrate 30 MG 24 hr tablet Commonly known as: IMDUR Take 1 tablet (30 mg total) by mouth daily.  Lantus SoloStar 100 UNIT/ML Solostar Pen Generic drug: insulin glargine Inject 12 Units into the skin daily. What changed: how much to take   magnesium gluconate 500 MG tablet Commonly known as: MAGONATE Take 500 mg by mouth daily.   PREVAGEN PO Take 1 tablet by mouth daily.   rosuvastatin 10 MG tablet Commonly known as: CRESTOR Take 10 mg by mouth daily.   tamsulosin 0.4 MG Caps capsule Commonly known as: FLOMAX Take 0.4 mg by mouth daily.   vitamin B-12 500 MCG tablet Commonly known as: CYANOCOBALAMIN Take 500 mcg by mouth daily.   Vitamin D3 10 MCG (400 UNIT) Caps Take 800 Units by mouth daily. What changed: how much to take               Durable Medical Equipment  (From admission, onward)           Start     Ordered   07/22/23 1303  For home use only DME oxygen  Once       Comments: please evaluate patient 1-6 pulse dose, if patient qualifies please dispence  Question Answer Comment  Length of Need Lifetime   Mode or (Route) Nasal cannula   Liters per Minute 1   Frequency Continuous (stationary and portable oxygen unit needed)   Oxygen conserving device Yes   Oxygen delivery system Gas       07/22/23 1303            Follow-up Information     Antoine Poche, MD. Go on 07/29/2023.   Specialty: Cardiology Why: Hospital Follow Up at 3:45 pm as scheduled Contact information: [address] [place] [address] [phone]         Carylon Perches, MD. Schedule an appointment as soon as possible for a visit in 1 week(s).   Specialty: Internal Medicine Why: Hospital Follow Up Contact information: [address] [place] [address] [phone]                No Known Allergies Allergies as of 07/23/2023   No Known Allergies      Medication List     STOP taking these medications    hydrochlorothiazide 12.5 MG tablet Commonly known as: HYDRODIURIL   metFORMIN 500 MG tablet Commonly known as: GLUCOPHAGE   olmesartan 40 MG tablet Commonly known as: BENICAR       TAKE these medications    ALPHA LIPOIC ACID PO Take 1 capsule by mouth in the morning, at noon, and at bedtime.   amLODipine 10 MG tablet Commonly known as: NORVASC TAKE ONE TABLET (10MG  TOTAL) BY MOUTH DAILY   apixaban 2.5 MG Tabs tablet Commonly known as: ELIQUIS Take 1 tablet (2.5 mg total) by mouth 2 (two) times daily.   aspirin 81 MG tablet Take 81 mg by mouth at bedtime.   cloNIDine 0.1 MG tablet Commonly known as: CATAPRES TAKE ONE TABLET (0.1MG  TOTAL) BY MOUTH TWO TIMES DAILY   furosemide 40 MG tablet Commonly known as: LASIX Take 1 tablet (40 mg total) by mouth daily. Start taking on: July 24, 2023   gabapentin 300 MG capsule Commonly known as: NEURONTIN Take 1 capsule by mouth at bedtime.   hydrALAZINE 100 MG tablet Commonly known as: APRESOLINE Take 1 tablet (100 mg total) by mouth 3 (three) times daily. Dose change.   isosorbide mononitrate 30 MG 24 hr tablet Commonly known as: IMDUR Take 1 tablet (30 mg total) by mouth daily.   Lantus  SoloStar 100 UNIT/ML Solostar Pen Generic drug: insulin glargine Inject 12 Units into the  skin daily. What changed: how much to take   magnesium gluconate 500 MG tablet Commonly known as: MAGONATE Take 500 mg by mouth daily.   PREVAGEN PO Take 1 tablet by mouth daily.   rosuvastatin 10 MG tablet Commonly known as: CRESTOR Take 10 mg by mouth daily.   tamsulosin 0.4 MG Caps capsule Commonly known as: FLOMAX Take 0.4 mg by mouth daily.   vitamin B-12 500 MCG tablet Commonly known as: CYANOCOBALAMIN Take 500 mcg by mouth daily.   Vitamin D3 10 MCG (400 UNIT) Caps Take 800 Units by mouth daily. What changed: how much to take               Durable Medical Equipment  (From admission, onward)           Start     Ordered   07/22/23 1303  For home use only DME oxygen  Once       Comments: please evaluate patient 1-6 pulse dose, if patient qualifies please dispence  Question Answer Comment  Length of Need Lifetime   Mode or (Route) Nasal cannula   Liters per Minute 1   Frequency Continuous (stationary and portable oxygen unit needed)   Oxygen conserving device Yes   Oxygen delivery system Gas      07/22/23 1303            Procedures/Studies: US Venous Img Lower Bilateral (DVT) Result Date: 07/21/2023 CLINICAL DATA:  141700 Pulmonary embolism (HCC) 141700 EXAM: BILATERAL LOWER EXTREMITY VENOUS DOPPLER ULTRASOUND TECHNIQUE: Gray-scale sonography with graded compression, as well as color Doppler and duplex ultrasound were performed to evaluate the lower extremity deep venous systems from the level of the common femoral vein and including the common femoral, femoral, profunda femoral, popliteal and calf veins including the posterior tibial, peroneal and gastrocnemius veins when visible. The superficial great saphenous vein was also interrogated. Spectral Doppler was utilized to evaluate flow at rest and with distal augmentation maneuvers in the common femoral, femoral and popliteal veins. COMPARISON:  CTA chest, 07/18/2023.  MR, 05/01/2021 FINDINGS: RIGHT  LOWER EXTREMITY VENOUS Normal compressibility of the RIGHT common femoral, superficial femoral, and popliteal veins, as well as the visualized calf veins. Visualized portions of profunda femoral vein and great saphenous vein unremarkable. No filling defects to suggest DVT on grayscale or color Doppler imaging. Doppler waveforms show normal direction of venous flow, normal respiratory plasticity and response to augmentation. OTHER No evidence of superficial thrombophlebitis. At the RIGHT upper thigh is a multi-septate, anechoic and avascular fluid collection measuring approximately 7.1 x 2 1 x 6.6 cm. Limitations: none LEFT LOWER EXTREMITY VENOUS Normal compressibility of the LEFT common femoral, superficial femoral, as well as the visualized portions of the profunda femoral and greater saphenous vein. The calf veins are difficult to evaluate. Heterogeneously-echogenic partially-occlusive filling defect with noncompressibility of the imaged portions of the LEFT popliteal vein. OTHER No evidence of superficial thrombophlebitis or abnormal fluid collection. Limitations: none IMPRESSION: 1. Subacute-to-chronic appearing, partially-occlusive DVT within the LEFT popliteal vein. 2. No evidence of femoropopliteal DVT or superficial thrombophlebitis within the RIGHT lower extremity. 3. 7 cm RIGHT upper thigh septated subcutaneous fluid collection, incompletely assessed. If continued clinical concern, consider musculoskeletal MR for further evaluation Roanna Banning, MD [facility] Electronically Signed   By: Roanna Banning M.D.   On: 07/21/2023 17:25   ECHOCARDIOGRAM COMPLETE Result Date: 07/20/2023  ECHOCARDIOGRAM REPORT   Patient Name:   Jonathan Avery Date of Exam: 07/20/2023 Medical Rec #:  [id number]     Height:       69.0 in Accession #:    [id number]    Weight:       203.9 lb Date of Birth:  30-Oct-1930     BSA:          2.083 m Patient Age:    89 years      BP:            127/42 mmHg Patient Gender: M             HR:           73 bpm. [facility] Procedure: 2D Echo, Cardiac Doppler, Color Doppler and Intracardiac            Opacification Agent (Both Spectral and Color Flow Doppler were            utilized during procedure). Indications:    Swelling [[id number]]  History:        Patient has prior history of Echocardiogram examinations, most                 recent 04/07/2018. CHF; Risk Factors:Hypertension and Diabetes.  Sonographer:    Webb Laws Referring Phys: [id number] EJIROGHENE E EMOKPAE IMPRESSIONS  1. Severe diffuse hypokinesis worse in the anterior, inferior and septal walls; apical akinesis.. Left ventricular ejection fraction, by estimation, is 25%. The left ventricle has severely decreased function. The left ventricle demonstrates global hypokinesis. The left ventricular internal cavity size was mildly dilated. There is mild left ventricular hypertrophy. Left ventricular diastolic parameters are consistent with Grade II diastolic dysfunction (pseudonormalization).  2. Right ventricular systolic function is mildly reduced. The right ventricular size is normal.  3. Left atrial size was moderately dilated.  4. The mitral valve is normal in structure. Mild mitral valve regurgitation.  5. The aortic valve is tricuspid. Aortic valve regurgitation is not visualized. FINDINGS  Left Ventricle: Severe diffuse hypokinesis worse in the anterior, inferior and septal walls; apical akinesis. Left ventricular ejection fraction, by estimation, is 25%. The left ventricle has severely decreased function. The left ventricle demonstrates global hypokinesis. Definity contrast agent was given IV to delineate the left ventricular endocardial borders. Strain imaging was not performed. The left ventricular internal cavity size was mildly dilated. There is mild left ventricular hypertrophy. Left ventricular diastolic parameters are consistent with Grade II diastolic dysfunction  (pseudonormalization). Right Ventricle: The right ventricular size is normal. Right vetricular wall thickness was not assessed. Right ventricular systolic function is mildly reduced. Left Atrium: Left atrial size was moderately dilated. Right Atrium: Right atrial size was normal in size. Pericardium: There is no evidence of pericardial effusion. Mitral Valve: The mitral valve is normal in structure. Mild mitral valve regurgitation. Tricuspid Valve: The tricuspid valve is normal in structure. Tricuspid valve regurgitation is trivial. Aortic Valve: The aortic valve is tricuspid. Aortic valve regurgitation is not visualized. Pulmonic Valve: The pulmonic valve was grossly normal. Pulmonic valve regurgitation is trivial. Aorta: The aortic root and ascending aorta are structurally normal, with no evidence of dilitation. IAS/Shunts: No atrial level shunt detected by color flow Doppler. Additional Comments: 3D imaging was not performed.  LEFT VENTRICLE PLAX 2D LVIDd:         5.50 cm      Diastology LVIDs:         3.80 cm  LV e' medial:    6.31 cm/s LV PW:         1.10 cm      LV E/e' medial:  16.2 LV IVS:        1.20 cm      LV e' lateral:   9.14 cm/s LVOT diam:     2.10 cm      LV E/e' lateral: 11.2 LV SV:         67 LV SV Index:   32 LVOT Area:     3.46 cm  LV Volumes (MOD) LV vol d, MOD A2C: 116.0 ml LV vol d, MOD A4C: 165.0 ml LV vol s, MOD A2C: 75.5 ml LV vol s, MOD A4C: 109.0 ml LV SV MOD A2C:     40.5 ml LV SV MOD A4C:     165.0 ml LV SV MOD BP:      49.7 ml RIGHT VENTRICLE RV Basal diam:  4.20 cm RV S prime:     11.40 cm/s TAPSE (M-mode): 1.8 cm LEFT ATRIUM             Index        RIGHT ATRIUM           Index LA diam:        4.50 cm 2.16 cm/m   RA Area:     15.00 cm LA Vol (A2C):   88.4 ml 42.44 ml/m  RA Volume:   40.50 ml  19.44 ml/m LA Vol (A4C):   88.3 ml 42.39 ml/m LA Biplane Vol: 94.2 ml 45.22 ml/m  AORTIC VALVE LVOT Vmax:   90.80 cm/s LVOT Vmean:  67.200 cm/s LVOT VTI:    0.194 m  AORTA Ao Root  diam: 3.40 cm Ao Asc diam:  4.00 cm MITRAL VALVE MV Area (PHT): 3.39 cm     SHUNTS MV Decel Time: 224 msec     Systemic VTI:  0.19 m MV E velocity: 102.00 cm/s  Systemic Diam: 2.10 cm MV A velocity: 45.40 cm/s MV E/A ratio:  2.25 Dietrich Pates MD Electronically signed by Dietrich Pates MD Signature Date/Time: 07/20/2023/5:35:06 PM    Final    CT Angio Chest PE W and/or Wo Contrast Result Date: 07/18/2023 CLINICAL DATA:  Pulmonary embolism (PE) suspected, low to intermediate prob, positive D-dimer EXAM: CT ANGIOGRAPHY CHEST WITH CONTRAST TECHNIQUE: Multidetector CT imaging of the chest was performed using the standard protocol during bolus administration of intravenous contrast. Multiplanar CT image reconstructions and MIPs were obtained to evaluate the vascular anatomy. RADIATION DOSE REDUCTION: This exam was performed according to the departmental dose-optimization program which includes automated exposure control, adjustment of the mA and/or kV according to patient size and/or use of iterative reconstruction technique. CONTRAST:  75mL OMNIPAQUE IOHEXOL 350 MG/ML SOLN COMPARISON:  02/07/2008 FINDINGS: Cardiovascular: Adequate opacification of the pulmonary arteries. Single tiny web-like nonocclusive thrombus within the distal aspect of the lobar branch artery of the right lower lobe (series 5, image 164). No additional pulmonary arterial filling defects. Pulmonary trunk measures 3.5 cm in diameter. Thoracic aorta is nonaneurysmal. Scattered atherosclerotic vascular calcifications of the aorta and coronary arteries. Heart size is upper limits of normal. No significant pericardial fluid. Mediastinum/Nodes: Multiple mildly enlarged mediastinal lymph nodes. Reference nodes include 12 mm AP window node (series 4, image 36), 10 mm precarinal node (series 4, image 38), and 14 mm subcarinal node, (series 4, image 46). Mildly enlarged right hilar lymph node measuring 12 mm (series 4, image 45). No enlarged  axillary or left  hilar lymph nodes. Multinodular thyroid gland with largest node on the right measuring up to 2.3 cm. Trachea and esophagus demonstrate no significant abnormality. Lungs/Pleura: Small layering bilateral pleural effusions. Predominantly ground-glass airspace opacity within the bilateral lower lobes, left worse than right. Minimal ground-glass opacity within the left upper lobe and lingula. 6 mm subpleural nodule in the periphery of the right middle lobe (series 6, image 78), stable since 2009 and does not require follow-up imaging. No new pulmonary nodules. No pneumothorax. Upper Abdomen: No acute abnormality. Musculoskeletal: No acute osseous abnormality. Findings of diffuse idiopathic skeletal hyperostosis. Degenerative changes of both shoulders. No significant chest wall abnormality. Review of the MIP images confirms the above findings. IMPRESSION: 1. Single tiny web-like nonocclusive thrombus within the distal aspect of the lobar branch artery of the right lower lobe, compatible with residua of a chronic pulmonary embolism. No additional pulmonary arterial filling defects. 2. Predominantly ground-glass airspace opacity within the bilateral lower lobes, left worse than right. Minimal ground-glass opacity within the left upper lobe and lingula. This could represent pulmonary edema or atypical/viral infection. 3. Small layering bilateral pleural effusions. 4. Multiple mildly enlarged mediastinal and right hilar lymph nodes, likely reactive. 5. Multinodular thyroid gland with largest node on the right measuring up to 2.3 cm. Recommend nonemergent thyroid US (ref: J Am Coll Radiol. 2015 Feb;12(2): 143-50). 6. Aortic and coronary artery atherosclerosis (ICD10-I70.0). Electronically Signed   By: Duanne Guess D.O.   On: 07/18/2023 14:32   DG Chest Portable 1 View Result Date: 07/18/2023 CLINICAL DATA:  Weakness, short of breath EXAM: PORTABLE CHEST 1 VIEW COMPARISON:  None Available. FINDINGS: Normal mediastinum  and cardiac silhouette. Normal pulmonary vasculature. No evidence of effusion, infiltrate, or pneumothorax. No acute bony abnormality. Nipple shadow projects over the LEFT costophrenic angle. IMPRESSION: No acute cardiopulmonary process. Electronically Signed   By: Genevive Bi M.D.   On: 07/18/2023 11:49     Subjective: Pt discussed with family and he wants to attempt apixaban and will monitor for s/s of bleeding and have his hemoglobin checked regularly.n  He has no SOB or CP and he has noticed marked improvement in the fluid in his legs/feet and hands.  He is eager to get back home today.   Discharge Exam: Vitals:   07/22/23 2153 07/23/23 0633  BP: (!) 142/61 (!) 147/62  Pulse: 67 70  Resp: 18 18  Temp: 98.6 F (37 C) 98.4 F (36.9 C)  SpO2: 94% 97%   Vitals:   07/22/23 1241 07/22/23 1651 07/22/23 2153 07/23/23 0633  BP: (!) 123/47 (!) 144/61 (!) 142/61 (!) 147/62  Pulse: 68 71 67 70  Resp: 18  18 18   Temp: 98 F (36.7 C)  98.6 F (37 C) 98.4 F (36.9 C)  TempSrc: Oral  Oral Oral  SpO2: 94%  94% 97%  Weight:    88.9 kg  Height:       General: Pt is alert, awake, not in acute distress Cardiovascular: normal S1/S2 +, no rubs, no gallops Respiratory: CTA bilaterally, no wheezing, no rhonchi Abdominal: Soft, NT, ND, bowel sounds + Extremities: trace pretibial edema, no cyanosis   The results of significant diagnostics from this hospitalization (including imaging, microbiology, ancillary and laboratory) are listed below for reference.     Microbiology: Recent Results (from the past 240 hours)  Resp panel by RT-PCR (RSV, Flu A&B, Covid) Anterior Nasal Swab     Status: None   Collection Time: 07/18/23 11:47 AM  Specimen: Anterior Nasal Swab  Result Value Ref Range Status   SARS Coronavirus 2 by RT PCR NEGATIVE NEGATIVE Final    Comment: (NOTE) SARS-CoV-2 target nucleic acids are NOT DETECTED.  The SARS-CoV-2 RNA is generally detectable in upper  respiratory specimens during the acute phase of infection. The lowest concentration of SARS-CoV-2 viral copies this assay can detect is 138 copies/mL. A negative result does not preclude SARS-Cov-2 infection and should not be used as the sole basis for treatment or other patient management decisions. A negative result may occur with  improper specimen collection/handling, submission of specimen other than nasopharyngeal swab, presence of viral mutation(s) within the areas targeted by this assay, and inadequate number of viral copies(<138 copies/mL). A negative result must be combined with clinical observations, patient history, and epidemiological information. The expected result is Negative.  Fact Sheet for Patients:  BloggerCourse.com  Fact Sheet for Healthcare Providers:  SeriousBroker.it  This test is no t yet approved or cleared by the [place] FDA and  has been authorized for detection and/or diagnosis of SARS-CoV-2 by FDA under an Emergency Use Authorization (EUA). This EUA will remain  in effect (meaning this test can be used) for the duration of the COVID-19 declaration under Section 564(b)(1) of the Act, 21 U.S.C.section 360bbb-3(b)(1), unless the authorization is terminated  or revoked sooner.       Influenza A by PCR NEGATIVE NEGATIVE Final   Influenza B by PCR NEGATIVE NEGATIVE Final    Comment: (NOTE) The Xpert Xpress SARS-CoV-2/FLU/RSV plus assay is intended as an aid in the diagnosis of influenza from Nasopharyngeal swab specimens and should not be used as a sole basis for treatment. Nasal washings and aspirates are unacceptable for Xpert Xpress SARS-CoV-2/FLU/RSV testing.  Fact Sheet for Patients: BloggerCourse.com  Fact Sheet for Healthcare Providers: SeriousBroker.it  This test is not yet approved or cleared by the [place] FDA and has been  authorized for detection and/or diagnosis of SARS-CoV-2 by FDA under an Emergency Use Authorization (EUA). This EUA will remain in effect (meaning this test can be used) for the duration of the COVID-19 declaration under Section 564(b)(1) of the Act, 21 U.S.C. section 360bbb-3(b)(1), unless the authorization is terminated or revoked.     Resp Syncytial Virus by PCR NEGATIVE NEGATIVE Final    Comment: (NOTE) Fact Sheet for Patients: BloggerCourse.com  Fact Sheet for Healthcare Providers: SeriousBroker.it  This test is not yet approved or cleared by the [place] FDA and has been authorized for detection and/or diagnosis of SARS-CoV-2 by FDA under an Emergency Use Authorization (EUA). This EUA will remain in effect (meaning this test can be used) for the duration of the COVID-19 declaration under Section 564(b)(1) of the Act, 21 U.S.C. section 360bbb-3(b)(1), unless the authorization is terminated or revoked.  Performed at [facility], [address] [place], [place] [address]      Labs: BNP (last 3 results) Recent Labs    07/18/23 1517  BNP 639.0*   Basic Metabolic Panel: Recent Labs  Lab 07/19/23 0434 07/20/23 0409 07/21/23 0356 07/22/23 0408 07/23/23 0438  NA 136 136 136 137 136  K 4.4 4.4 4.2 4.2 3.8  CL 109 106 105 107 107  CO2 21* 21* 22 23 23   GLUCOSE 102* 141* 143* 106* 138*  BUN 38* 45* 53* 54* 51*  CREATININE 1.93* 2.14* 2.24* 2.03* 1.97*  CALCIUM 8.1* 8.0* 8.2* 8.1* 8.2*  MG  --   --   --  1.9  --  PHOS  --   --   --  4.5 3.6   Liver Function Tests: Recent Labs  Lab 07/18/23 1147 07/22/23 0408 07/23/23 0438  AST 17  --   --   ALT 9  --   --   ALKPHOS 50  --   --   BILITOT 1.2  --   --   PROT 6.3*  --   --   ALBUMIN 3.1* 2.6* 2.7*   No results for input(s): "LIPASE", "AMYLASE" in the last 168 hours. No results for input(s): "AMMONIA" in the last 168 hours. CBC: Recent Labs  Lab  07/18/23 1147 07/21/23 1113 07/22/23 0408 07/23/23 0800  WBC 8.9 6.1 5.9 5.8  NEUTROABS 7.3  --   --   --   HGB 10.2* 10.3* 9.5* 9.8*  HCT 31.1* 31.3* 29.4* 30.2*  MCV 93.7 91.5 93.0 94.1  PLT 128* 195 198 216   Cardiac Enzymes: No results for input(s): "CKTOTAL", "CKMB", "CKMBINDEX", "TROPONINI" in the last 168 hours. BNP: Invalid input(s): "POCBNP" CBG: Recent Labs  Lab 07/22/23 0741 07/22/23 1107 07/22/23 1611 07/22/23 2201 07/23/23 0740  GLUCAP 132* 202* 149* 158* 136*   D-Dimer No results for input(s): "DDIMER" in the last 72 hours. Hgb A1c No results for input(s): "HGBA1C" in the last 72 hours. Lipid Profile No results for input(s): "CHOL", "HDL", "LDLCALC", "TRIG", "CHOLHDL", "LDLDIRECT" in the last 72 hours. Thyroid function studies Recent Labs    07/21/23 1113  TSH 2.091   Anemia work up No results for input(s): "VITAMINB12", "FOLATE", "FERRITIN", "TIBC", "IRON", "RETICCTPCT" in the last 72 hours. Urinalysis    Component Value Date/Time   COLORURINE YELLOW 07/18/2023 1804   APPEARANCEUR CLEAR 07/18/2023 1804   LABSPEC 1.030 07/18/2023 1804   PHURINE 5.0 07/18/2023 1804   GLUCOSEU 50 (A) 07/18/2023 1804   HGBUR NEGATIVE 07/18/2023 1804   BILIRUBINUR NEGATIVE 07/18/2023 1804   KETONESUR NEGATIVE 07/18/2023 1804   PROTEINUR 100 (A) 07/18/2023 1804   NITRITE NEGATIVE 07/18/2023 1804   LEUKOCYTESUR NEGATIVE 07/18/2023 1804   Sepsis Labs Recent Labs  Lab 07/18/23 1147 07/21/23 1113 07/22/23 0408 07/23/23 0800  WBC 8.9 6.1 5.9 5.8   Microbiology Recent Results (from the past 240 hours)  Resp panel by RT-PCR (RSV, Flu A&B, Covid) Anterior Nasal Swab     Status: None   Collection Time: 07/18/23 11:47 AM   Specimen: Anterior Nasal Swab  Result Value Ref Range Status   SARS Coronavirus 2 by RT PCR NEGATIVE NEGATIVE Final    Comment: (NOTE) SARS-CoV-2 target nucleic acids are NOT DETECTED.  The SARS-CoV-2 RNA is generally detectable in upper  respiratory specimens during the acute phase of infection. The lowest concentration of SARS-CoV-2 viral copies this assay can detect is 138 copies/mL. A negative result does not preclude SARS-Cov-2 infection and should not be used as the sole basis for treatment or other patient management decisions. A negative result may occur with  improper specimen collection/handling, submission of specimen other than nasopharyngeal swab, presence of viral mutation(s) within the areas targeted by this assay, and inadequate number of viral copies(<138 copies/mL). A negative result must be combined with clinical observations, patient history, and epidemiological information. The expected result is Negative.  Fact Sheet for Patients:  BloggerCourse.com  Fact Sheet for Healthcare Providers:  SeriousBroker.it  This test is no t yet approved or cleared by the [place] FDA and  has been authorized for detection and/or diagnosis of SARS-CoV-2 by FDA under an Emergency Use Authorization (EUA).  This EUA will remain  in effect (meaning this test can be used) for the duration of the COVID-19 declaration under Section 564(b)(1) of the Act, 21 U.S.C.section 360bbb-3(b)(1), unless the authorization is terminated  or revoked sooner.       Influenza A by PCR NEGATIVE NEGATIVE Final   Influenza B by PCR NEGATIVE NEGATIVE Final    Comment: (NOTE) The Xpert Xpress SARS-CoV-2/FLU/RSV plus assay is intended as an aid in the diagnosis of influenza from Nasopharyngeal swab specimens and should not be used as a sole basis for treatment. Nasal washings and aspirates are unacceptable for Xpert Xpress SARS-CoV-2/FLU/RSV testing.  Fact Sheet for Patients: BloggerCourse.com  Fact Sheet for Healthcare Providers: SeriousBroker.it  This test is not yet approved or cleared by the [place] FDA and has been  authorized for detection and/or diagnosis of SARS-CoV-2 by FDA under an Emergency Use Authorization (EUA). This EUA will remain in effect (meaning this test can be used) for the duration of the COVID-19 declaration under Section 564(b)(1) of the Act, 21 U.S.C. section 360bbb-3(b)(1), unless the authorization is terminated or revoked.     Resp Syncytial Virus by PCR NEGATIVE NEGATIVE Final    Comment: (NOTE) Fact Sheet for Patients: BloggerCourse.com  Fact Sheet for Healthcare Providers: SeriousBroker.it  This test is not yet approved or cleared by the [place] FDA and has been authorized for detection and/or diagnosis of SARS-CoV-2 by FDA under an Emergency Use Authorization (EUA). This EUA will remain in effect (meaning this test can be used) for the duration of the COVID-19 declaration under Section 564(b)(1) of the Act, 21 U.S.C. section 360bbb-3(b)(1), unless the authorization is terminated or revoked.  Performed at [facility], [address], [place], [place] [address]    Time coordinating discharge: 41 mins   SIGNED:  Standley Dakins, MD  [facility] 07/23/2023, 11:05 AM How to contact the [facility] Attending or Consulting provider 7A - 7P or covering provider during after hours 7P -7A, for this patient?  Check the care team in [facility] and look for a) attending/consulting TRH provider listed and b) the [facility] team listed Log into [email or web address] and use [place]'s universal password to access. If you do not have the password, please contact the hospital operator. Locate the [facility] provider you are looking for under [facility] and page to a number that you can be directly reached. If you still have difficulty reaching the provider, please page the [facility] (Director on Call) for the Hospitalists listed on amion for assistance.

## 2023-07-23 NOTE — TOC Transition Note (Signed)
 Transition of Care [facility]) - Discharge Note   Patient Details  Name: Jonathan Avery MRN: [id number] Date of Birth: 04-Oct-1930  [facility] CM/SW Contact:  Isabella Bowens, LCSWA Phone Number: 07/23/2023, 11:14 AM   Clinical Narrative:    Patient is discharging today . CSW spoke with pt and daughter at bedside regarding setting up home oxygen . Daughter nor pt had a preference and was agreeable to having [employer] provide home oxygen . CSW spoke with Ian Malkin about home oxygen needs, Ian Malkin able to accept and provide home set up . TOC signing off.   Daughter did ask about pt having a portal to use when in the community . CSW shared information with Ian Malkin. Ian Malkin stated that the portable one would need to be sent under POC evaluation which will take place after the pt is home.   MD decided to keep patient one more day . Patient is discharging today , orders have been placed.   Final next level of care: Home/Self Care Barriers to Discharge: Barriers Resolved   Patient Goals and CMS Choice Patient states their goals for this hospitalization and ongoing recovery are:: return back home CMS Medicare.gov Compare Post Acute Care list provided to:: Patient (Daughter - [name]) Choice offered to / list presented to : Patient, Adult Children [place] ownership interest in [facility].provided to:: Patient    Discharge Placement                  Name of family member notified: Dyquan and [name] ( patient and daughter ) Patient and family notified of of transfer: 07/22/23  Discharge Plan and Services Additional resources added to the After Visit Summary for   In-house Referral: Clinical Social Work Discharge Planning Services: CM Consult            DME Arranged: Oxygen DME Agency: [employer] Date DME Agency Contacted: 07/22/23 Time DME Agency Contacted: 1139 Representative spoke with at DME Agency: Beryl Meager Agency: NA        Social Drivers of Health (SDOH)  Interventions SDOH Screenings   Food Insecurity: No Food Insecurity (07/18/2023)  Housing: Low Risk  (07/18/2023)  Transportation Needs: No Transportation Needs (07/18/2023)  Utilities: Not At Risk (07/18/2023)  Social Connections: Moderately Isolated (07/19/2023)  Tobacco Use: Medium Risk (07/18/2023)     Readmission Risk Interventions    07/23/2023   11:10 AM 07/22/2023   11:38 AM 07/19/2023   12:48 PM  Readmission Risk Prevention Plan  Transportation Screening Complete Complete Complete  Home Care Screening Complete Complete Complete  Medication Review (RN CM) Complete Complete Complete

## 2023-07-23 NOTE — Progress Notes (Signed)
 Removed IV-CDI. Reviewed d/c paperwork with patient and daughter. Answered questions. Wheeled stable patient and belongings (oxygen) to main entrance where he was picked up by his daughter.

## 2023-07-26 ENCOUNTER — Telehealth: Payer: Self-pay

## 2023-07-26 NOTE — Transitions of Care (Post Inpatient/ED Visit) (Signed)
   07/26/2023  Name: Jonathan Avery MRN: [id number] DOB: [birth year]  Today's TOC FU Call Status: Today's TOC FU Call Status:: Unsuccessful Call (1st Attempt) Unsuccessful Call (1st Attempt) Date: 07/26/23  Attempted to reach the patient regarding the most recent Inpatient/ED visit. Patient was called in an Outreach attempt to offer VBCI  30-day TOC program. Pt is eligible for program due to potential risk for readmission and/or high utilization. Unfortunately, I was not able to speak with the patient in regards to recent hospital discharge     Patient's voicemail has  a generic greeting. To maintain HIPAA compliance, left message with VBCI CM contact information only  and a request for a call back .   Follow Up Plan: Additional outreach attempts will be made to reach the patient to complete the Transitions of Care (Post Inpatient/ED visit) call.   Susa Loffler , BSN, RN [facility]   VBCI-Population Health RN Care Manager Direct Dial [phone]  Fax: [phone] Website: [email or web address]

## 2023-07-27 ENCOUNTER — Telehealth: Payer: Self-pay

## 2023-07-27 DIAGNOSIS — E785 Hyperlipidemia, unspecified: Secondary | ICD-10-CM | POA: Diagnosis not present

## 2023-07-27 DIAGNOSIS — E1129 Type 2 diabetes mellitus with other diabetic kidney complication: Secondary | ICD-10-CM | POA: Diagnosis not present

## 2023-07-27 DIAGNOSIS — I2510 Atherosclerotic heart disease of native coronary artery without angina pectoris: Secondary | ICD-10-CM | POA: Diagnosis not present

## 2023-07-27 DIAGNOSIS — Z79899 Other long term (current) drug therapy: Secondary | ICD-10-CM | POA: Diagnosis not present

## 2023-07-27 DIAGNOSIS — I10 Essential (primary) hypertension: Secondary | ICD-10-CM | POA: Diagnosis not present

## 2023-07-27 NOTE — Transitions of Care (Post Inpatient/ED Visit) (Signed)
   07/27/2023  Name: AKSHAR STARNES MRN: [id number] DOB: [birth year]  Today's TOC FU Call Status: Today's TOC FU Call Status:: Unsuccessful Call (2nd Attempt) Unsuccessful Call (1st Attempt) Date: 07/26/23 Unsuccessful Call (2nd Attempt) Date: 07/27/23  Attempted to reach the patient regarding the most recent Inpatient/ED visit. Patient was called in an Outreach attempt to offer VBCI  30-day TOC program. Pt is eligible for program due to potential risk for readmission and/or high utilization. Unfortunately, I was not able to speak with the patient in regards to recent hospital discharge    Patient's voicemail has  a generic greeting. To maintain HIPAA compliance, left message with VBCI CM contact information only  and a request for a call back .   Follow Up Plan: Additional outreach attempts will be made to reach the patient to complete the Transitions of Care (Post Inpatient/ED visit) call.   Susa Loffler , BSN, RN [facility]   VBCI-Population Health RN Care Manager Direct Dial [phone]  Fax: [phone] Website: [email or web address]

## 2023-07-28 ENCOUNTER — Telehealth: Payer: Self-pay

## 2023-07-28 NOTE — Transitions of Care (Post Inpatient/ED Visit) (Signed)
   07/28/2023  Name: Jonathan Avery MRN: [id number] DOB: 06/28/1930  Today's TOC FU Call Status: Today's TOC FU Call Status:: Unsuccessful Call (3rd Attempt) Unsuccessful Call (1st Attempt) Date: 07/26/23 Unsuccessful Call (2nd Attempt) Date: 07/27/23 Unsuccessful Call (3rd Attempt) Date: 07/28/23   Three attempts have been made to reach the patient regarding the most recent Inpatient/ED visit.   Based on the VBCI program guidelines, if  we are unable to reach the patient  after 3 attempts, no additional outreach attempts will be made and the TOC follow-up will be closed Unfortunately, we have been unable to make contact with the patient for follow up.   The [facility] Case Management Team is available to follow up with the patient after provider conversation with the patient regarding recommendation for care management engagement and subsequent re-referral to the case management team.The VBCI CM team can be reached by calling [phone]. Marland Kitchen    Follow Up Plan: No further outreach attempts will be made at this time. We have been unable to contact the patient.  Susa Loffler , BSN, RN [facility]   VBCI-Population Health RN Care Manager Direct Dial [phone]  Fax: [phone] Website: [email or web address]

## 2023-07-29 ENCOUNTER — Ambulatory Visit: Payer: Medicare HMO | Attending: Cardiology | Admitting: Cardiology

## 2023-07-29 ENCOUNTER — Encounter: Payer: Self-pay | Admitting: Cardiology

## 2023-07-29 VITALS — BP 150/60 | HR 72 | Ht 69.5 in | Wt 196.6 lb

## 2023-07-29 DIAGNOSIS — I5022 Chronic systolic (congestive) heart failure: Secondary | ICD-10-CM

## 2023-07-29 DIAGNOSIS — I10 Essential (primary) hypertension: Secondary | ICD-10-CM | POA: Diagnosis not present

## 2023-07-29 DIAGNOSIS — E785 Hyperlipidemia, unspecified: Secondary | ICD-10-CM

## 2023-07-29 DIAGNOSIS — I2510 Atherosclerotic heart disease of native coronary artery without angina pectoris: Secondary | ICD-10-CM | POA: Diagnosis not present

## 2023-07-29 NOTE — Progress Notes (Signed)
 Clinical Summary [name] is a 89 y.o.male seen today for follow up of the following medical problems.    1.HFrEF - - 2019 echo: LVE 55-60%, grade I dd - 06/2023 echo: LVEF 25%, grade II dd, mild RV dysfunction - BNP 639, CXR no acute process. CT PE pulm edema vs atypical infection, small bilateral effusions.  - fluid overloaded during 06/2023 admission, was diuresed - - intolerant to beta blockers due to bradycardia -GFR 32 avoid ACE/ARB/ARNI/SGLT2i/MRA - poor cath canididate with advanced age, renal dysfunction GFR 32   - no SOB/DOE, no recent edema -home weight 196 lbs after recent discharged,  trending down to 194 most recently - compliant with meds.   2. PE - CT PE with very small chronic PE - LE Dopplers subacute to chronic DVT left popliteal - per primary team, started on eliquis 2.5mg  bid     3. History of CAD/MI - notes metion history of MI 20 years ago with [facility] at [facility]. No records available in epic. Patient denies any recurrent ischemic events.   Metoprolol stopped in the past due to low HRs and fatigue     - no recent chest pains       4..  HTN - home bp's 115-130s/50s - compliant with meds        5. Hyperlipidemia - 02/2018 TC 166 TG 477 HDL 27 - 03/2020 TC 116 TG 150 HDL 31 LDL 59 Estimated Creatinine Clearance: 27.8 mL/min (A) (by C-G formula based on SCr of 1.97 mg/dL (H)). He is on highest dose of crestor he can be on based on renal function at 10mg     5. Carotid stensosis 10/2020 RICA 1-39%, LICA 40-59% - repeat US in June with appt with Dr Jonathan Avery.     10/2021 RICA 1--39%, LICA 40-59%   Past Medical History:  Diagnosis Date   BPH (benign prostatic hypertrophy)    Cancer (HCC)    skin cancer - basal   Carotid artery occlusion    Diabetes (HCC)    Type II   High cholesterol    Hypertension    Myocardial infarction [facility])    Peripheral vascular disease (HCC)    Stroke (HCC)      documented 12/28/2019- ? TIA  " weeks 3 weeks ago,  expresive aphasia lasted 2 -3 minutes."      No Known Allergies   Current Outpatient Medications  Medication Sig Dispense Refill   ALPHA LIPOIC ACID PO Take 1 capsule by mouth in the morning, at noon, and at bedtime.     amLODipine (NORVASC) 10 MG tablet TAKE ONE TABLET (10MG  TOTAL) BY MOUTH DAILY 90 tablet 1   apixaban (ELIQUIS) 2.5 MG TABS tablet Take 1 tablet (2.5 mg total) by mouth 2 (two) times daily. 60 tablet 1   Apoaequorin (PREVAGEN PO) Take 1 tablet by mouth daily.     Cholecalciferol (VITAMIN D3) 10 MCG (400 UNIT) CAPS Take 800 Units by mouth daily.     cloNIDine (CATAPRES) 0.1 MG tablet TAKE ONE TABLET (0.1MG  TOTAL) BY MOUTH TWO TIMES DAILY 180 tablet 1   furosemide (LASIX) 40 MG tablet Take 1 tablet (40 mg total) by mouth daily. 30 tablet 2   gabapentin (NEURONTIN) 300 MG capsule Take 1 capsule by mouth at bedtime.     hydrALAZINE (APRESOLINE) 100 MG tablet Take 1 tablet (100 mg total) by mouth 3 (three) times daily. Dose change. 180 tablet 5   isosorbide mononitrate (IMDUR) 30 MG  24 hr tablet Take 1 tablet (30 mg total) by mouth daily. 30 tablet 2   LANTUS SOLOSTAR 100 UNIT/ML Solostar Pen Inject 12 Units into the skin daily.     magnesium gluconate (MAGONATE) 500 MG tablet Take 500 mg by mouth daily.     rosuvastatin (CRESTOR) 10 MG tablet Take 10 mg by mouth daily.     tamsulosin (FLOMAX) 0.4 MG CAPS capsule Take 0.4 mg by mouth daily.     vitamin B-12 (CYANOCOBALAMIN) 500 MCG tablet Take 500 mcg by mouth daily.     No current facility-administered medications for this visit.     Past Surgical History:  Procedure Laterality Date   AMPUTATION Right 09/12/2021   Procedure: AMPUTATION DIGIT;  Surgeon: Jonathan Hearing, MD;  Location: [facility];  Service: Orthopedics;  Laterality: Right;   CARDIAC CATHETERIZATION     CHOLECYSTECTOMY  1973   COLONOSCOPY  07/20/2006   Dr. [name]:Status post right hemicolectomy. Residual colonic mucosa appeared normal, normal rectum.     COLONOSCOPY  2006/2007   sprawling villous adenoma at ileocecal valve   COLONOSCOPY  2011   Dr. Jena [facility]: normal rectum, pancolonic diverticulosis, 2 diminutive polyps, with path benign polypoid colonic mucosa   COLONOSCOPY N/A 01/02/2015   Procedure: COLONOSCOPY;  Surgeon: Jonathan Ade, MD;  Location: [facility];  Service: Endoscopy;  Laterality: N/A;  1245   CORONARY ANGIOPLASTY     ENDARTERECTOMY Right 12/29/2019   Procedure: ENDARTERECTOMY CAROTID;  Surgeon: Jonathan Earthly, MD;  Location: [facility] OR;  Service: Vascular;  Laterality: Right;   ESOPHAGOGASTRODUODENOSCOPY (EGD) WITH PROPOFOL N/A 01/13/2018   Procedure: ESOPHAGOGASTRODUODENOSCOPY (EGD) WITH PROPOFOL;  Surgeon: Jonathan Fee, MD;  Location: [facility];  Service: Endoscopy;  Laterality: N/A;   EUS N/A 01/13/2018   Procedure: UPPER ENDOSCOPIC ULTRASOUND (EUS) RADIAL;  Surgeon: Jonathan Fee, MD;  Location: [facility];  Service: Endoscopy;  Laterality: N/A;   FINE NEEDLE ASPIRATION N/A 01/13/2018   Procedure: FINE NEEDLE ASPIRATION (FNA) LINEAR;  Surgeon: Jonathan Fee, MD;  Location: [facility];  Service: Endoscopy;  Laterality: N/A;   open hemicolectomy  2007   due to villous adenoma   PATCH ANGIOPLASTY Right 12/29/2019   Procedure: RIGHT CAROTID PATCH ANGIOPLASTY USING HEMASHIELD PLATINUM FINESSE;  Surgeon: Jonathan Earthly, MD;  Location: [facility] OR;  Service: Vascular;  Laterality: Right;     No Known Allergies    Family History  Problem Relation Age of Onset   Ulcers Father    Diabetes type II Brother    Diabetes type II Brother    Colon cancer Neg Hx      Social History [name] reports that he has quit smoking. His smoking use included cigars. He has never used smokeless tobacco. [name] reports no history of alcohol use.  Physical Examination Vitals:   07/29/23 1551 07/29/23 1640  BP: (!) 158/60 (!) 150/60  Pulse: 72   SpO2: 94%    Filed Weights   07/29/23 1551  Weight: 196 lb 9.6 oz (89.2 kg)     Gen: resting comfortably, no acute distress HEENT: no scleral icterus, pupils equal round and reactive, no palptable cervical adenopathy,  CV: RRR, no m/rg, no jvd Resp: Clear to auscultation bilaterally GI: abdomen is soft, non-tender, non-distended, normal bowel sounds, no hepatosplenomegaly MSK: extremities are warm, no edema.  Skin: warm, no rash Neuro:  no focal deficits Psych: appropriate affect   Diagnostic Studies  03/2021 Renal artery US Renal:  Right: Normal size right kidney. Abnormal right Resistive Index.         Abnormal cortical thickness of right kidney. 1-59% stenosis         of the right renal artery. RRV flow present. Cyst(s) noted.         Avascular cystic mass noted in the mid pole of the right         kidney, measuring 1.3 x 1.2 x 1.3 cm.  Left:  Normal size of left kidney. Abnormal left Resistive Index.         Abnormal cortical thickness of the left kidney. 1-59%         stenosis of the left renal artery. LRV flow present. Cyst(s)         noted. Avascular cystic mass in the mid pole of the left         kidney, measuring 3.8 x 3.6 x 3.9 cm.  Mesenteric:  Normal Celiac artery and Superior Mesenteric artery findings.      03/2021 monitor Patch wear time was 6 days and 18hours Predominant rhythm is NSR with average HR 57bpm (ranging 42-141bpm) One run of nonsustained VT lasting 4 beats Six runs of nonsustained SVT with longest 8 beats at 101bpm Rare SVE, rare VE (<1%) No sustained arrhythmias or significant pauses     Patch Wear Time:  6 days and 18 hours (2022-10-31T15:18:10-398 to 2022-11-07T08:43:32-0500)   Patient had a min HR of 42 bpm, max HR of 141 bpm, and avg HR of 57 bpm. Predominant underlying rhythm was Sinus Rhythm. 1 run of Ventricular Tachycardia occurred lasting 4 beats with a max rate of 141 bpm (avg 128 bpm). 6 Supraventricular Tachycardia  runs occurred, the run with the fastest interval lasting 6 beats with a max rate of 128  bpm, the longest lasting 8 beats with an avg rate of 101 bpm. Isolated SVEs were rare (<1.0%), SVE Couplets were rare (<1.0%), and SVE Triplets were rare (<1.0%).  Isolated VEs were rare (<1.0%, 591), VE Couplets were rare (<1.0%, 11), and VE Triplets were rare (<1.0%, 2). Ventricular Bigeminy was present.    Assessment and Plan   1. CAD - reported history of MI at least 20 years ago, he reports angioplasty at that time. Do not see records in [facility] system. - no recent symptoms, continue current meds  2.Chronic HFrEF - recent diagnosis during recent admission - did not pursue cath due to renal function, renal function has also limited medical therapy - euvolemic today, stable home weights. Continue current meds     3. HTN - elevated here but home numbers well controlled, continue current meds   4. Hyperlipidemia - we will request labs from [facility], M.D.

## 2023-07-29 NOTE — Patient Instructions (Addendum)
Medication Instructions:   Stop Aspirin.   Continue all other medications.    Labwork: none  Testing/Procedures: none  Follow-Up: 3 months   Any Other Special Instructions Will Be Listed Below (If Applicable).  If you need a refill on your cardiac medications before your next appointment, please call your pharmacy.

## 2023-07-30 DIAGNOSIS — E1122 Type 2 diabetes mellitus with diabetic chronic kidney disease: Secondary | ICD-10-CM | POA: Diagnosis not present

## 2023-07-30 DIAGNOSIS — I82401 Acute embolism and thrombosis of unspecified deep veins of right lower extremity: Secondary | ICD-10-CM | POA: Diagnosis not present

## 2023-07-30 DIAGNOSIS — I2510 Atherosclerotic heart disease of native coronary artery without angina pectoris: Secondary | ICD-10-CM | POA: Diagnosis not present

## 2023-07-30 DIAGNOSIS — I2601 Septic pulmonary embolism with acute cor pulmonale: Secondary | ICD-10-CM | POA: Diagnosis not present

## 2023-08-03 ENCOUNTER — Encounter: Payer: Self-pay | Admitting: *Deleted

## 2023-08-20 ENCOUNTER — Ambulatory Visit: Payer: Medicare HMO | Admitting: Cardiology

## 2023-08-23 DIAGNOSIS — I700 Atherosclerosis of aorta: Secondary | ICD-10-CM | POA: Diagnosis not present

## 2023-08-23 DIAGNOSIS — Z79899 Other long term (current) drug therapy: Secondary | ICD-10-CM | POA: Diagnosis not present

## 2023-08-23 DIAGNOSIS — I82402 Acute embolism and thrombosis of unspecified deep veins of left lower extremity: Secondary | ICD-10-CM | POA: Diagnosis not present

## 2023-08-23 DIAGNOSIS — I2510 Atherosclerotic heart disease of native coronary artery without angina pectoris: Secondary | ICD-10-CM | POA: Diagnosis not present

## 2023-08-23 DIAGNOSIS — N400 Enlarged prostate without lower urinary tract symptoms: Secondary | ICD-10-CM | POA: Diagnosis not present

## 2023-08-23 DIAGNOSIS — N1832 Chronic kidney disease, stage 3b: Secondary | ICD-10-CM | POA: Diagnosis not present

## 2023-08-23 DIAGNOSIS — E1129 Type 2 diabetes mellitus with other diabetic kidney complication: Secondary | ICD-10-CM | POA: Diagnosis not present

## 2023-08-28 ENCOUNTER — Other Ambulatory Visit: Payer: Self-pay | Admitting: Cardiology

## 2023-08-30 DIAGNOSIS — I10 Essential (primary) hypertension: Secondary | ICD-10-CM | POA: Diagnosis not present

## 2023-08-30 DIAGNOSIS — N183 Chronic kidney disease, stage 3 unspecified: Secondary | ICD-10-CM | POA: Diagnosis not present

## 2023-08-30 DIAGNOSIS — I2601 Septic pulmonary embolism with acute cor pulmonale: Secondary | ICD-10-CM | POA: Diagnosis not present

## 2023-08-30 DIAGNOSIS — E1122 Type 2 diabetes mellitus with diabetic chronic kidney disease: Secondary | ICD-10-CM | POA: Diagnosis not present

## 2023-08-30 DIAGNOSIS — I5022 Chronic systolic (congestive) heart failure: Secondary | ICD-10-CM | POA: Diagnosis not present

## 2023-09-02 DIAGNOSIS — L570 Actinic keratosis: Secondary | ICD-10-CM | POA: Diagnosis not present

## 2023-09-02 DIAGNOSIS — X32XXXD Exposure to sunlight, subsequent encounter: Secondary | ICD-10-CM | POA: Diagnosis not present

## 2023-09-02 DIAGNOSIS — C44329 Squamous cell carcinoma of skin of other parts of face: Secondary | ICD-10-CM | POA: Diagnosis not present

## 2023-09-13 DIAGNOSIS — C44329 Squamous cell carcinoma of skin of other parts of face: Secondary | ICD-10-CM | POA: Diagnosis not present

## 2023-09-22 DIAGNOSIS — C44329 Squamous cell carcinoma of skin of other parts of face: Secondary | ICD-10-CM | POA: Diagnosis not present

## 2023-10-06 ENCOUNTER — Telehealth: Payer: Self-pay | Admitting: Cardiology

## 2023-10-06 NOTE — Telephone Encounter (Signed)
 Pt's daughter would like an order to remove/discontinue oxygen  tank due to not needing it. Requesting cb

## 2023-10-06 NOTE — Telephone Encounter (Signed)
 I am unsure we even ordered the oxygen  but I will check with Dr.Branch

## 2023-10-07 NOTE — Telephone Encounter (Signed)
 Per Dr.Branch   Jonathan Pollock, MD we don't manage home O2, it was started in the hospital by the hospitalist, would need to be managed by pcp   Patient daughter informed and verbalized understanding of plan.

## 2023-10-14 ENCOUNTER — Other Ambulatory Visit: Payer: Self-pay | Admitting: Cardiology

## 2023-10-26 ENCOUNTER — Telehealth: Payer: Self-pay

## 2023-10-26 NOTE — Progress Notes (Signed)
   10/26/2023  Patient ID: Jonathan Avery, male   DOB: 06/25/1930, 89 y.o.   MRN: [id number]  Adherence Monitoring:  Overdue on olmesartan, rosuvastatin  due soon, coordinated with daughter Catherin Closs and pharmacy to get both refilled. Documentation in [employer].  [name], PharmD

## 2023-11-01 ENCOUNTER — Ambulatory Visit: Attending: Nurse Practitioner | Admitting: Nurse Practitioner

## 2023-11-01 ENCOUNTER — Encounter: Payer: Self-pay | Admitting: Nurse Practitioner

## 2023-11-01 ENCOUNTER — Other Ambulatory Visit: Payer: Self-pay | Admitting: Nurse Practitioner

## 2023-11-01 VITALS — BP 140/70 | HR 62 | Ht 69.0 in | Wt 190.0 lb

## 2023-11-01 DIAGNOSIS — N1832 Chronic kidney disease, stage 3b: Secondary | ICD-10-CM

## 2023-11-01 DIAGNOSIS — Z86718 Personal history of other venous thrombosis and embolism: Secondary | ICD-10-CM

## 2023-11-01 DIAGNOSIS — E785 Hyperlipidemia, unspecified: Secondary | ICD-10-CM | POA: Diagnosis not present

## 2023-11-01 DIAGNOSIS — I2782 Chronic pulmonary embolism: Secondary | ICD-10-CM

## 2023-11-01 DIAGNOSIS — I2510 Atherosclerotic heart disease of native coronary artery without angina pectoris: Secondary | ICD-10-CM

## 2023-11-01 DIAGNOSIS — I6529 Occlusion and stenosis of unspecified carotid artery: Secondary | ICD-10-CM

## 2023-11-01 DIAGNOSIS — I10 Essential (primary) hypertension: Secondary | ICD-10-CM

## 2023-11-01 MED ORDER — APIXABAN 2.5 MG PO TABS: 2.5000 mg | ORAL_TABLET | Freq: Two times a day (BID) | ORAL | 1 refills | Status: DC

## 2023-11-01 MED ORDER — APIXABAN 2.5 MG PO TABS: 2.5000 mg | ORAL_TABLET | Freq: Two times a day (BID) | ORAL | Status: DC

## 2023-11-01 MED ORDER — APIXABAN 2.5 MG PO TABS
2.5000 mg | ORAL_TABLET | Freq: Two times a day (BID) | ORAL | 1 refills | Status: DC
Start: 2023-11-01 — End: 2023-11-02

## 2023-11-01 NOTE — Patient Instructions (Addendum)
 Medication Instructions:  Your physician has recommended you make the following change in your medication:  Please reduce Eliquis  to 2.5 Mg Twice daily   Labwork: None   Testing/Procedures: None   Follow-Up: Your physician recommends that you schedule a follow-up appointment in: 6 months   Any Other Special Instructions Will Be Listed Below (If Applicable).  If you need a refill on your cardiac medications before your next appointment, please call your pharmacy.

## 2023-11-01 NOTE — Progress Notes (Unsigned)
 Cardiology Office Note:  .   Date: 11/01/2023 ID:  Marda Shack, DOB [birth year], MRN [id number] PCP: Artemisa Bile, MD  [facility] Providers Cardiologist:  Armida Lander, MD    History of Present Illness: .   Jonathan Avery is a 89 y.o. male with a PMH of CAD, history of MI 20 years ago with PTCA at [facility], hypertension, type 2 diabetes, bradycardia, hyperlipidemia, and carotid artery stenosis, who presents today for scheduled follow-up.  Lopressor  stopped in the past due to bradycardia.  Last seen by Dr. Armida Lander on February 17, 2022.  Blood pressure in office that day was 118/56.  Dr. Amanda Jungling noted medical management for blood pressure was affected by aggressive BP, wide pulse pressure often with a low DBP's, bradycardia, and renal dysfunction.  Had significant AKI while on chlorthalidone .  Due to these limitations and due to his advanced age, was reasonable with SBP in the 150s and due to concern with his DPP dropping too low.  ED visit on January 05, 2023 at [facility] for weakness and dizziness. Noted lightheadedness with bending over, denied LOC.  Initially presented to urgent care and obtain an EKG, was sent to the ED.  Lab work revealed low magnesium  and was given magnesium  supplement, patient was ordered meclizine  for symptomatic relief.  Chest x-ray was negative for anything acute.  Cardiac monitor revealed sinus rhythm.  02/18/2023 - Today he presents for overdue 76-month follow-up appointment.  He states he is doing well.  Does have some dizziness that he says is about going on.  Says he did receive the prescription of meclizine  and took some, but medication did not seem to help. Denies any chest pain, shortness of breath, palpitations, syncope, presyncope, orthopnea, PND, swelling or significant weight changes, acute bleeding, or claudication.  Hospitalized 2/205 with acute hypoxic respiratory failure and acute HFrEF, Echo revealed EF 25%, grade 2 DD, chronic PE was  incidentally noted on CT scan, pt and family opted to monitor without OAC at the time.  Lower extremity Doppler revealed findings of subacute to chronic DVT left popliteal area. Decided on trial of Eliquis  2.5 mg BID.   11/01/2023 - Here for follow-up. Doing well. Denies any acute cardiac complaints or issues. Denies any chest pain, shortness of breath, palpitations, syncope, presyncope, dizziness, orthopnea, PND, swelling or significant weight changes, acute bleeding, or claudication.  ROS: Negative.  See HPI.  Studies Reviewed: Aaron Aas    EKG: EKG is not ordered today.   Ultrasound venous lower extremities bilateral 06/2023: IMPRESSION: 1. Subacute-to-chronic appearing, partially-occlusive DVT within the LEFT popliteal vein. 2. No evidence of femoropopliteal DVT or superficial thrombophlebitis within the RIGHT lower extremity. 3. 7 cm RIGHT upper thigh septated subcutaneous fluid collection, incompletely assessed. If continued clinical concern, consider musculoskeletal MR for further evaluation  Echo 06/2023:   1. Severe diffuse hypokinesis worse in the anterior, inferior and septal  walls; apical akinesis.. Left ventricular ejection fraction, by  estimation, is 25%. The left ventricle has severely decreased function.  The left ventricle demonstrates global  hypokinesis. The left ventricular internal cavity size was mildly dilated.  There is mild left ventricular hypertrophy. Left ventricular diastolic  parameters are consistent with Grade II diastolic dysfunction  (pseudonormalization).   2. Right ventricular systolic function is mildly reduced. The right  ventricular size is normal.   3. Left atrial size was moderately dilated.   4. The mitral valve is normal in structure. Mild mitral valve  regurgitation.   5.  The aortic valve is tricuspid. Aortic valve regurgitation is not  visualized.   Carotid duplex 02/2023:  Summary:  Right Carotid: Velocities in the right ICA are consistent  with a 1-39%  stenosis.   Left Carotid: Velocities in the left ICA are consistent with a 40-59%  stenosis.   Vertebrals: Bilateral vertebral arteries demonstrate antegrade flow.  Subclavians: Normal flow hemodynamics were seen in bilateral subclavian arteries.   *See table(s) above for measurements and observations.  Carotid duplex 10/2021: Summary:  Right Carotid: Velocities in the right ICA are consistent with a 1-39%  stenosis. Patent CEA.   Left Carotid: Velocities in the left ICA are consistent with a 40-59%  stenosis.   Vertebrals: Bilateral vertebral arteries demonstrate antegrade flow.  Subclavians: Normal flow hemodynamics were seen in bilateral subclavian arteries.   Cardiac Monitor 03/2021: Patch wear time was 6 days and 18hours Predominant rhythm is NSR with average HR 57bpm (ranging 42-141bpm) One run of nonsustained VT lasting 4 beats Six runs of nonsustained SVT with longest 8 beats at 101bpm Rare SVE, rare VE (<1%) No sustained arrhythmias or significant pauses     Patch Wear Time:  6 days and 18 hours (2022-10-31T15:18:10-398 to 2022-11-07T08:43:32-0500)   Patient had a min HR of 42 bpm, max HR of 141 bpm, and avg HR of 57 bpm. Predominant underlying rhythm was Sinus Rhythm. 1 run of Ventricular Tachycardia occurred lasting 4 beats with a max rate of 141 bpm (avg 128 bpm). 6 Supraventricular Tachycardia  runs occurred, the run with the fastest interval lasting 6 beats with a max rate of 128 bpm, the longest lasting 8 beats with an avg rate of 101 bpm. Isolated SVEs were rare (<1.0%), SVE Couplets were rare (<1.0%), and SVE Triplets were rare (<1.0%).  Isolated VEs were rare (<1.0%, 591), VE Couplets were rare (<1.0%, 11), and VE Triplets were rare (<1.0%, 2). Ventricular Bigeminy was present.   Renal artery duplex 03/2021: Summary:  Largest Aortic Diameter: 2.2 cm    Renal:    Right: Normal size right kidney. Abnormal right Resistive Index.          Abnormal cortical thickness of right kidney. 1-59% stenosis         of the right renal artery. RRV flow present. Cyst(s) noted.         Avascular cystic mass noted in the mid pole of the right         kidney, measuring 1.3 x 1.2 x 1.3 cm.  Left:  Normal size of left kidney. Abnormal left Resistive Index.         Abnormal cortical thickness of the left kidney. 1-59%         stenosis of the left renal artery. LRV flow present. Cyst(s)         noted. Avascular cystic mass in the mid pole of the left         kidney, measuring 3.8 x 3.6 x 3.9 cm.  Mesenteric:  Normal Celiac artery and Superior Mesenteric artery findings.    Patent IVC.    Lexiscan  03/2018: Blood pressure demonstrated a normal response to exercise. There was no ST segment deviation noted during stress. Findings consistent with prior myocardial infarction. This is an intermediate risk study. The left ventricular ejection fraction is moderately decreased (30-44%).   Large inferior wall infarct from apex to base with no ischemia EF 42%  Echocardiogram 03/2018: Study Conclusions   - Left ventricle: The cavity size was normal. Wall thickness was  normal. Systolic function was normal. The estimated ejection    fraction was in the range of 55% to 60%. Wall motion was normal;    there were no regional wall motion abnormalities. Doppler    parameters are consistent with abnormal left ventricular    relaxation (grade 1 diastolic dysfunction).  - Aortic valve: Mildly calcified annulus. Trileaflet. There was    mild regurgitation.  - Mitral valve: Mildly calcified annulus. There was mild    regurgitation.  - Left atrium: The atrium was mildly dilated.  - Tricuspid valve: There was trivial regurgitation. Peak RV-RA    gradient (S): 29 mm Hg.  - Pulmonary arteries: Systolic pressure could not be accurately    estimated.  - Pericardium, extracardiac: There was no pericardial effusion.     Physical Exam:   VS:  BP (!) 140/70    Pulse 62   Ht 5' 9 (1.753 m)   Wt 190 lb (86.2 kg)   SpO2 96%   BMI 28.06 kg/m    Wt Readings from Last 3 Encounters:  11/01/23 190 lb (86.2 kg)  07/29/23 196 lb 9.6 oz (89.2 kg)  07/23/23 195 lb 15.8 oz (88.9 kg)    GEN: Well nourished, well developed in no acute distress NECK: No JVD; No carotid bruits CARDIAC: S1/S2, RRR, no murmurs, rubs, gallops RESPIRATORY:  Clear to auscultation without rales, wheezing or rhonchi  ABDOMEN: Soft, non-tender, non-distended EXTREMITIES:  No edema; No deformity   ASSESSMENT AND PLAN: .    CAD History of MI 20 years ago with PTCA at [facility]. Stable with no anginal symptoms. No indication for ischemic evaluation.  Continue current medication regimen.  No longer on aspirin  due to being on low-dose Eliquis -see below.  Heart healthy diet and regular cardiovascular exercise as tolerated encouraged.   HTN BP mildly elevated today, but at goal considering his age. Medical management limited d/t wide pulse pressure with hx of low DBP's, bradycardia, and hx of renal dysfunction with significant AKI on chlorthalidone . Dr. Amanda Jungling previously recommended that given these limitations and due to his advanced age, SBP in 150s to be reasonable, concerned about his DBP dropping too low.  No medication changes at this time. Heart healthy diet and regular cardiovascular exercise encouraged.   HLD LDL 68 03/2022.  Continue rosuvastatin . Heart healthy diet and regular cardiovascular exercise encouraged. Will request labs from PCP.   Carotid artery stenosis Carotid Doppler 02/2023 results noted above.  Denies any symptoms.  He has been followed by [facility].  Continue current medication regimen.   5. Chronic PE/hx of DVT Now on low dose Eliquis , tolerating well. Denies any acute signs/symptoms. No bleeding issues. Continue current medication regimen. Will provide refill of Eliquis .   6. CKD stage 3b Most recent serum creatinine was 1.97 with eGFR of 32. Stable kidney  disease. Avoid nephrotoxic agents. Follow-up with PCP and Nephrology.    Dispo: Care and ED precautions discussed. Follow-up with Dr. Armida Lander or APP in 6 months or sooner if anything changes.  Signed, Lasalle Pointer, NP

## 2023-11-02 ENCOUNTER — Other Ambulatory Visit: Payer: Self-pay | Admitting: *Deleted

## 2023-11-02 MED ORDER — APIXABAN 2.5 MG PO TABS: 2.5000 mg | ORAL_TABLET | Freq: Two times a day (BID) | ORAL | 1 refills | Status: AC

## 2023-11-02 NOTE — Telephone Encounter (Signed)
 Prescription refill request for Eliquis  received. Indication: AF Last office visit: 11/01/23  Jonathan Furry NP Scr: 1.97 on 07/23/23  Epic Age: 89 Weight: 86.2kg  Based on above findings Eliquis  2.5mg  twice daily is the appropriate dose.  Refill approved.

## 2023-11-29 DIAGNOSIS — N1832 Chronic kidney disease, stage 3b: Secondary | ICD-10-CM | POA: Diagnosis not present

## 2023-11-29 DIAGNOSIS — Z79899 Other long term (current) drug therapy: Secondary | ICD-10-CM | POA: Diagnosis not present

## 2023-11-29 DIAGNOSIS — E1129 Type 2 diabetes mellitus with other diabetic kidney complication: Secondary | ICD-10-CM | POA: Diagnosis not present

## 2023-11-29 DIAGNOSIS — I5022 Chronic systolic (congestive) heart failure: Secondary | ICD-10-CM | POA: Diagnosis not present

## 2023-11-29 DIAGNOSIS — I10 Essential (primary) hypertension: Secondary | ICD-10-CM | POA: Diagnosis not present

## 2023-12-06 DIAGNOSIS — N183 Chronic kidney disease, stage 3 unspecified: Secondary | ICD-10-CM | POA: Diagnosis not present

## 2023-12-06 DIAGNOSIS — I10 Essential (primary) hypertension: Secondary | ICD-10-CM | POA: Diagnosis not present

## 2023-12-06 DIAGNOSIS — I5022 Chronic systolic (congestive) heart failure: Secondary | ICD-10-CM | POA: Diagnosis not present

## 2023-12-06 DIAGNOSIS — E1122 Type 2 diabetes mellitus with diabetic chronic kidney disease: Secondary | ICD-10-CM | POA: Diagnosis not present

## 2023-12-30 DIAGNOSIS — X32XXXD Exposure to sunlight, subsequent encounter: Secondary | ICD-10-CM | POA: Diagnosis not present

## 2023-12-30 DIAGNOSIS — Z85828 Personal history of other malignant neoplasm of skin: Secondary | ICD-10-CM | POA: Diagnosis not present

## 2023-12-30 DIAGNOSIS — Z08 Encounter for follow-up examination after completed treatment for malignant neoplasm: Secondary | ICD-10-CM | POA: Diagnosis not present

## 2023-12-30 DIAGNOSIS — L570 Actinic keratosis: Secondary | ICD-10-CM | POA: Diagnosis not present

## 2024-02-26 ENCOUNTER — Other Ambulatory Visit: Payer: Self-pay | Admitting: Cardiology

## 2024-03-15 DIAGNOSIS — I5022 Chronic systolic (congestive) heart failure: Secondary | ICD-10-CM | POA: Diagnosis not present

## 2024-03-15 DIAGNOSIS — I10 Essential (primary) hypertension: Secondary | ICD-10-CM | POA: Diagnosis not present

## 2024-03-15 DIAGNOSIS — Z79899 Other long term (current) drug therapy: Secondary | ICD-10-CM | POA: Diagnosis not present

## 2024-03-15 DIAGNOSIS — E1129 Type 2 diabetes mellitus with other diabetic kidney complication: Secondary | ICD-10-CM | POA: Diagnosis not present

## 2024-03-15 DIAGNOSIS — N1832 Chronic kidney disease, stage 3b: Secondary | ICD-10-CM | POA: Diagnosis not present

## 2024-03-22 DIAGNOSIS — E1122 Type 2 diabetes mellitus with diabetic chronic kidney disease: Secondary | ICD-10-CM | POA: Diagnosis not present

## 2024-03-22 DIAGNOSIS — Z86718 Personal history of other venous thrombosis and embolism: Secondary | ICD-10-CM | POA: Diagnosis not present

## 2024-03-22 DIAGNOSIS — E785 Hyperlipidemia, unspecified: Secondary | ICD-10-CM | POA: Diagnosis not present

## 2024-03-22 DIAGNOSIS — R800 Isolated proteinuria: Secondary | ICD-10-CM | POA: Diagnosis not present

## 2024-03-22 DIAGNOSIS — N183 Chronic kidney disease, stage 3 unspecified: Secondary | ICD-10-CM | POA: Diagnosis not present

## 2024-03-22 DIAGNOSIS — I10 Essential (primary) hypertension: Secondary | ICD-10-CM | POA: Diagnosis not present

## 2024-03-22 DIAGNOSIS — I5022 Chronic systolic (congestive) heart failure: Secondary | ICD-10-CM | POA: Diagnosis not present

## 2024-03-22 DIAGNOSIS — I2510 Atherosclerotic heart disease of native coronary artery without angina pectoris: Secondary | ICD-10-CM | POA: Diagnosis not present

## 2024-03-22 DIAGNOSIS — Z23 Encounter for immunization: Secondary | ICD-10-CM | POA: Diagnosis not present

## 2024-04-04 DIAGNOSIS — E1129 Type 2 diabetes mellitus with other diabetic kidney complication: Secondary | ICD-10-CM | POA: Diagnosis not present

## 2024-04-04 DIAGNOSIS — N1832 Chronic kidney disease, stage 3b: Secondary | ICD-10-CM | POA: Diagnosis not present

## 2024-04-04 DIAGNOSIS — Z79899 Other long term (current) drug therapy: Secondary | ICD-10-CM | POA: Diagnosis not present

## 2024-04-10 ENCOUNTER — Other Ambulatory Visit: Payer: Self-pay | Admitting: Nurse Practitioner

## 2024-05-04 ENCOUNTER — Encounter: Payer: Self-pay | Admitting: Nurse Practitioner

## 2024-05-04 ENCOUNTER — Ambulatory Visit: Attending: Nurse Practitioner | Admitting: Nurse Practitioner

## 2024-05-04 VITALS — BP 148/60 | HR 61 | Ht 70.0 in | Wt 191.2 lb

## 2024-05-04 DIAGNOSIS — E785 Hyperlipidemia, unspecified: Secondary | ICD-10-CM | POA: Diagnosis not present

## 2024-05-04 DIAGNOSIS — I2782 Chronic pulmonary embolism: Secondary | ICD-10-CM | POA: Diagnosis not present

## 2024-05-04 DIAGNOSIS — I10 Essential (primary) hypertension: Secondary | ICD-10-CM | POA: Diagnosis not present

## 2024-05-04 DIAGNOSIS — I6529 Occlusion and stenosis of unspecified carotid artery: Secondary | ICD-10-CM | POA: Diagnosis not present

## 2024-05-04 DIAGNOSIS — Z86718 Personal history of other venous thrombosis and embolism: Secondary | ICD-10-CM

## 2024-05-04 DIAGNOSIS — I2510 Atherosclerotic heart disease of native coronary artery without angina pectoris: Secondary | ICD-10-CM | POA: Diagnosis not present

## 2024-05-04 DIAGNOSIS — N1832 Chronic kidney disease, stage 3b: Secondary | ICD-10-CM

## 2024-05-04 NOTE — Patient Instructions (Signed)
 Medication Instructions:  Continue all current medications.   Labwork: none  Testing/Procedures: none  Follow-Up: 6 months   Any Other Special Instructions Will Be Listed Below (If Applicable).   If you need a refill on your cardiac medications before your next appointment, please call your pharmacy.

## 2024-05-04 NOTE — Progress Notes (Signed)
 Cardiology Office Note:  .   Date: 05/04/2024 ID:  Jonathan Avery, DOB [birth year], MRN [id number] PCP: Sheryle Carwin, MD  [facility] Providers Cardiologist:  Alvan Carrier, MD    History of Present Illness: .   Jonathan Avery is a 89 y.o. male with a PMH of CAD, history of MI 20 years ago with PTCA at [facility], hypertension, type 2 diabetes, bradycardia, hyperlipidemia, and carotid artery stenosis, who presents today for scheduled follow-up.  Lopressor  stopped in the past due to bradycardia.  Last seen by Dr. Carrier Alvan on February 17, 2022.  Blood pressure in office that day was 118/56.  Dr. Alvan noted medical management for blood pressure was affected by aggressive BP, wide pulse pressure often with a low DBP's, bradycardia, and renal dysfunction.  Had significant AKI while on chlorthalidone .  Due to these limitations and due to his advanced age, was reasonable with SBP in the 150s and due to concern with his DPP dropping too low.  ED visit on January 05, 2023 at [facility] for weakness and dizziness. Noted lightheadedness with bending over, denied LOC.  Initially presented to urgent care and obtain an EKG, was sent to the ED.  Lab work revealed low magnesium  and was given magnesium  supplement, patient was ordered meclizine  for symptomatic relief.  Chest x-ray was negative for anything acute.  Cardiac monitor revealed sinus rhythm.  Hospitalized 2/205 with acute hypoxic respiratory failure and acute HFrEF, Echo revealed EF 25%, grade 2 DD, chronic PE was incidentally noted on CT scan, pt and family opted to monitor without OAC at the time.  Lower extremity Doppler revealed findings of subacute to chronic DVT left popliteal area. Decided on trial of Eliquis  2.5 mg BID.   At last visit in June, he was doing well.   05/04/2024 - Here for follow-up. Doing well and denies any cardiac issues. Denies any chest pain, shortness of breath, palpitations, syncope, presyncope, dizziness,  orthopnea, PND, swelling or significant weight changes, acute bleeding, or claudication.  ROS: Negative.  See HPI.  Studies Reviewed: SABRA    EKG: EKG is not ordered today.   Ultrasound venous lower extremities bilateral 06/2023: IMPRESSION: 1. Subacute-to-chronic appearing, partially-occlusive DVT within the LEFT popliteal vein. 2. No evidence of femoropopliteal DVT or superficial thrombophlebitis within the RIGHT lower extremity. 3. 7 cm RIGHT upper thigh septated subcutaneous fluid collection, incompletely assessed. If continued clinical concern, consider musculoskeletal MR for further evaluation  Echo 06/2023:   1. Severe diffuse hypokinesis worse in the anterior, inferior and septal  walls; apical akinesis.. Left ventricular ejection fraction, by  estimation, is 25%. The left ventricle has severely decreased function.  The left ventricle demonstrates global  hypokinesis. The left ventricular internal cavity size was mildly dilated.  There is mild left ventricular hypertrophy. Left ventricular diastolic  parameters are consistent with Grade II diastolic dysfunction  (pseudonormalization).   2. Right ventricular systolic function is mildly reduced. The right  ventricular size is normal.   3. Left atrial size was moderately dilated.   4. The mitral valve is normal in structure. Mild mitral valve  regurgitation.   5. The aortic valve is tricuspid. Aortic valve regurgitation is not  visualized.   Carotid duplex 02/2023:  Summary:  Right Carotid: Velocities in the right ICA are consistent with a 1-39%  stenosis.   Left Carotid: Velocities in the left ICA are consistent with a 40-59%  stenosis.   Vertebrals: Bilateral vertebral arteries demonstrate antegrade flow.  Subclavians:  Normal flow hemodynamics were seen in bilateral subclavian arteries.   *See table(s) above for measurements and observations.  Carotid duplex 10/2021: Summary:  Right Carotid: Velocities in the right  ICA are consistent with a 1-39%  stenosis. Patent CEA.   Left Carotid: Velocities in the left ICA are consistent with a 40-59%  stenosis.   Vertebrals: Bilateral vertebral arteries demonstrate antegrade flow.  Subclavians: Normal flow hemodynamics were seen in bilateral subclavian arteries.   Cardiac Monitor 03/2021: Patch wear time was 6 days and 18hours Predominant rhythm is NSR with average HR 57bpm (ranging 42-141bpm) One run of nonsustained VT lasting 4 beats Six runs of nonsustained SVT with longest 8 beats at 101bpm Rare SVE, rare VE (<1%) No sustained arrhythmias or significant pauses     Patch Wear Time:  6 days and 18 hours (2022-10-31T15:18:10-398 to 2022-11-07T08:43:32-0500)   Patient had a min HR of 42 bpm, max HR of 141 bpm, and avg HR of 57 bpm. Predominant underlying rhythm was Sinus Rhythm. 1 run of Ventricular Tachycardia occurred lasting 4 beats with a max rate of 141 bpm (avg 128 bpm). 6 Supraventricular Tachycardia  runs occurred, the run with the fastest interval lasting 6 beats with a max rate of 128 bpm, the longest lasting 8 beats with an avg rate of 101 bpm. Isolated SVEs were rare (<1.0%), SVE Couplets were rare (<1.0%), and SVE Triplets were rare (<1.0%).  Isolated VEs were rare (<1.0%, 591), VE Couplets were rare (<1.0%, 11), and VE Triplets were rare (<1.0%, 2). Ventricular Bigeminy was present.   Renal artery duplex 03/2021: Summary:  Largest Aortic Diameter: 2.2 cm    Renal:    Right: Normal size right kidney. Abnormal right Resistive Index.         Abnormal cortical thickness of right kidney. 1-59% stenosis         of the right renal artery. RRV flow present. Cyst(s) noted.         Avascular cystic mass noted in the mid pole of the right         kidney, measuring 1.3 x 1.2 x 1.3 cm.  Left:  Normal size of left kidney. Abnormal left Resistive Index.         Abnormal cortical thickness of the left kidney. 1-59%         stenosis of the left renal  artery. LRV flow present. Cyst(s)         noted. Avascular cystic mass in the mid pole of the left         kidney, measuring 3.8 x 3.6 x 3.9 cm.  Mesenteric:  Normal Celiac artery and Superior Mesenteric artery findings.    Patent IVC.    Lexiscan  03/2018: Blood pressure demonstrated a normal response to exercise. There was no ST segment deviation noted during stress. Findings consistent with prior myocardial infarction. This is an intermediate risk study. The left ventricular ejection fraction is moderately decreased (30-44%).   Large inferior wall infarct from apex to base with no ischemia EF 42%  Echocardiogram 03/2018: Study Conclusions   - Left ventricle: The cavity size was normal. Wall thickness was    normal. Systolic function was normal. The estimated ejection    fraction was in the range of 55% to 60%. Wall motion was normal;    there were no regional wall motion abnormalities. Doppler    parameters are consistent with abnormal left ventricular    relaxation (grade 1 diastolic dysfunction).  - Aortic valve: Mildly calcified  annulus. Trileaflet. There was    mild regurgitation.  - Mitral valve: Mildly calcified annulus. There was mild    regurgitation.  - Left atrium: The atrium was mildly dilated.  - Tricuspid valve: There was trivial regurgitation. Peak RV-RA    gradient (S): 29 mm Hg.  - Pulmonary arteries: Systolic pressure could not be accurately    estimated.  - Pericardium, extracardiac: There was no pericardial effusion.     Physical Exam:   VS:  BP (!) 148/60   Pulse 61   Ht 5' 10 (1.778 m)   Wt 191 lb 3.2 oz (86.7 kg)   SpO2 97%   BMI 27.43 kg/m    Wt Readings from Last 3 Encounters:  05/04/24 191 lb 3.2 oz (86.7 kg)  11/01/23 190 lb (86.2 kg)  07/29/23 196 lb 9.6 oz (89.2 kg)    GEN: Well nourished, well developed in no acute distress NECK: No JVD; No carotid bruits CARDIAC: S1/S2, RRR, no murmurs, rubs, gallops RESPIRATORY:  Clear to  auscultation without rales, wheezing or rhonchi  ABDOMEN: Soft, non-tender, non-distended EXTREMITIES:  No edema; No deformity   ASSESSMENT AND PLAN: .    CAD History of MI 20 years ago with PTCA at [facility]. Stable with no anginal symptoms. No indication for ischemic evaluation.  Continue current medication regimen.  No longer on aspirin  due to being on low-dose Eliquis -see below.  Heart healthy diet and regular cardiovascular exercise as tolerated encouraged.   HTN BP mildly elevated today, but at goal considering his age. Medical management limited d/t wide pulse pressure with hx of low DBP's, bradycardia, and hx of renal dysfunction with significant AKI on chlorthalidone . Dr. Alvan previously recommended that given these limitations and due to his advanced age, SBP in 150s to be reasonable, concerned about his DBP dropping too low.  No medication changes at this time. Heart healthy diet and regular cardiovascular exercise encouraged.   HLD LDL 68 03/2022.  Continue rosuvastatin . Heart healthy diet and regular cardiovascular exercise encouraged. Will request labs from PCP.   Carotid artery stenosis Carotid Doppler 02/2023 results noted above.  Denies any symptoms.  He has been followed by [facility].  Continue current medication regimen.   5. Chronic PE/hx of DVT Doing well on low dose Eliquis , tolerating well. Denies any acute signs/symptoms. No bleeding issues. Continue current medication regimen.   6. CKD stage 3b Most labs show stable kidney disease. Avoid nephrotoxic agents. Follow-up with PCP and Nephrology.    Dispo: Care and ED precautions discussed. Follow-up with Dr. Dorn Alvan or APP in 6 months or sooner if anything changes.  Signed, Almarie Crate, NP

## 2024-05-12 ENCOUNTER — Other Ambulatory Visit: Payer: Self-pay | Admitting: *Deleted

## 2024-05-12 DIAGNOSIS — I6523 Occlusion and stenosis of bilateral carotid arteries: Secondary | ICD-10-CM

## 2024-06-05 NOTE — Progress Notes (Unsigned)
 "                                      [facility] [place]  Patient name: Jonathan Avery MRN: [id number] DOB: December 30, 1930 Sex: male  REASON FOR VISIT: Follow-up carotid disease  HPI: Previously: Jonathan Avery is a 89 y.o. male here today for follow-up.  He denies any new neurologic changes.  He is status post right carotid endarterectomy in August 2021.  He had an episode a year ago of expressive aphasia and work-up was negative.  He does have known mild to moderate stenosis in his left internal carotid artery.  He has had no new neurologic deficits.  Specifically no aphasia or TIA or stroke.  He remains quite active at 44.  02/23/23: Patient returns for surveillance of carotid artery stenosis.  He is doing well overall.  He forgot to take his morning blood pressure medicine.  His systolic blood pressure is extremely high.  The patient has no symptoms from hypertension.  He has no headache, abdominal pain, etc.  I counseled the patient to take his morning blood pressure medicine and recheck his blood pressure in an hour, if it is not improved he needs to go to the emergency department for evaluation.  Past Medical History:  Diagnosis Date   BPH (benign prostatic hypertrophy)    Cancer (HCC)    skin cancer - basal   Carotid artery occlusion    Diabetes (HCC)    Type II   High cholesterol    Hypertension    Myocardial infarction [facility])    Peripheral vascular disease    Stroke (HCC)      documented 12/28/2019- ? TIA   weeks 3 weeks ago, expresive aphasia lasted 2 -3 minutes.     Family History  Problem Relation Age of Onset   Ulcers Father    Diabetes type II Brother    Diabetes type II Brother    Colon cancer Neg Hx     SOCIAL HISTORY: Social History   Tobacco Use   Smoking status: Former    Types: Cigars   Smokeless tobacco: Never  Substance Use Topics   Alcohol use: No    Alcohol/week: 0.0 standard drinks of alcohol    No Known Allergies  Current  Outpatient Medications  Medication Sig Dispense Refill   ALPHA LIPOIC ACID PO Take 1 capsule by mouth in the morning, at noon, and at bedtime.     ALPRAZolam (XANAX) 0.25 MG tablet Take by mouth as needed.     amLODipine  (NORVASC ) 10 MG tablet TAKE ONE TABLET (10MG  TOTAL) BY MOUTH DAILY 90 tablet 2   apixaban  (ELIQUIS ) 2.5 MG TABS tablet Take 1 tablet (2.5 mg total) by mouth 2 (two) times daily. 180 tablet 1   Apoaequorin (PREVAGEN PO) Take 1 tablet by mouth daily.     Cholecalciferol (VITAMIN D3) 10 MCG (400 UNIT) CAPS Take 800 Units by mouth daily.     cloNIDine  (CATAPRES ) 0.1 MG tablet TAKE ONE TABLET (0.1MG  TOTAL) BY MOUTH TWO TIMES DAILY 180 tablet 2   furosemide  (LASIX ) 40 MG tablet Take 1 tablet (40 mg total) by mouth daily. 30 tablet 2   gabapentin  (NEURONTIN ) 300 MG capsule Take 1 capsule by mouth at bedtime.     hydrALAZINE  (APRESOLINE ) 100 MG tablet Take 1 tablet (100 mg total) by mouth 3 (three) times daily. Dose  change. 180 tablet 5   isosorbide  mononitrate (IMDUR ) 30 MG 24 hr tablet Take 1 tablet (30 mg total) by mouth daily. 30 tablet 2   LANTUS  SOLOSTAR 100 UNIT/ML Solostar Pen Inject 12 Units into the skin daily.     magnesium  gluconate (MAGONATE) 500 MG tablet Take 500 mg by mouth daily.     metFORMIN  (GLUCOPHAGE ) 500 MG tablet Take 500 mg by mouth 2 (two) times daily.     olmesartan (BENICAR) 40 MG tablet Take 40 mg by mouth daily.     ondansetron  (ZOFRAN ) 4 MG tablet Take by mouth as needed.     rosuvastatin  (CRESTOR ) 10 MG tablet Take 10 mg by mouth daily.     tamsulosin  (FLOMAX ) 0.4 MG CAPS capsule Take 0.4 mg by mouth daily.     vitamin B-12 (CYANOCOBALAMIN ) 500 MCG tablet Take 500 mcg by mouth daily.     No current facility-administered medications for this visit.     PHYSICAL EXAM: There were no vitals filed for this visit.    GENERAL: The patient is a well-nourished male, in no acute distress. The vital signs are documented above. CARDIOVASCULAR: Carotid  arteries without bruits bilaterally.  Well-healed right carotid incision.  2+ radial pulses. PULMONARY: There is good air exchange  MUSCULOSKELETAL: There are no major deformities or cyanosis. NEUROLOGIC: No focal weakness or paresthesias are detected. SKIN: There are no ulcers or rashes noted. PSYCHIATRIC: The patient has a normal affect.  DATA:  Carotid duplex 02/23/2023 Right Carotid: Velocities in the right ICA are consistent with a 1-39%  stenosis.   Left Carotid: Velocities in the left ICA are consistent with a 40-59%  stenosis.   Vertebrals: Bilateral vertebral arteries demonstrate antegrade flow.  Subclavians: Normal flow hemodynamics were seen in bilateral subclavian               arteries.   MEDICAL ISSUES: Stable overall.  No evidence of symptomatic carotid disease.  He knows to report immediately to the emergency room should he develop any neurologic deficits. I counseled the patient to take his morning blood pressure medicine and recheck his blood pressure in an hour, if it is not improved he needs to go to the emergency department for evaluation.  Otherwise we will see him in 1 year with repeat carotid duplex  Debby SAILOR. Magda, MD [facility] Phone Number: [phone] 06/05/2024 3:58 PM  "

## 2024-06-06 ENCOUNTER — Ambulatory Visit

## 2024-06-06 ENCOUNTER — Encounter: Payer: Self-pay | Admitting: Vascular Surgery

## 2024-06-06 ENCOUNTER — Ambulatory Visit: Admitting: Vascular Surgery

## 2024-06-06 VITALS — BP 167/71 | HR 67 | Ht 70.0 in | Wt 195.0 lb

## 2024-06-06 DIAGNOSIS — I6523 Occlusion and stenosis of bilateral carotid arteries: Secondary | ICD-10-CM | POA: Diagnosis not present
